# Patient Record
Sex: Female | Born: 1960 | Race: White | Hispanic: No | Smoking: Former smoker
Health system: Southern US, Community
[De-identification: ages and names within clinical notes are randomized; demographics above are authoritative.]

## PROBLEM LIST (undated history)

## (undated) DIAGNOSIS — E785 Hyperlipidemia, unspecified: Secondary | ICD-10-CM

## (undated) DIAGNOSIS — Z952 Presence of prosthetic heart valve: Secondary | ICD-10-CM

## (undated) DIAGNOSIS — I10 Essential (primary) hypertension: Secondary | ICD-10-CM

## (undated) DIAGNOSIS — Z8774 Personal history of (corrected) congenital malformations of heart and circulatory system: Secondary | ICD-10-CM

## (undated) HISTORY — DX: Essential (primary) hypertension: I10

## (undated) HISTORY — PX: TRANSTHORACIC ECHOCARDIOGRAM: SHX275

## (undated) HISTORY — DX: Hyperlipidemia, unspecified: E78.5

## (undated) HISTORY — DX: Personal history of (corrected) congenital malformations of heart and circulatory system: Z87.74

## (undated) HISTORY — DX: Presence of prosthetic heart valve: Z95.2

---

## 1998-12-24 HISTORY — PX: ABDOMINAL HYSTERECTOMY: SHX81

## 2000-04-24 ENCOUNTER — Other Ambulatory Visit: Admission: RE | Admit: 2000-04-24 | Discharge: 2000-04-24 | Payer: Self-pay | Admitting: Obstetrics and Gynecology

## 2000-12-24 HISTORY — PX: ANTERIOR CRUCIATE LIGAMENT REPAIR: SHX115

## 2002-08-06 ENCOUNTER — Inpatient Hospital Stay (HOSPITAL_COMMUNITY): Admission: AD | Admit: 2002-08-06 | Discharge: 2002-08-15 | Payer: Self-pay | Admitting: Obstetrics and Gynecology

## 2002-08-06 ENCOUNTER — Encounter (INDEPENDENT_AMBULATORY_CARE_PROVIDER_SITE_OTHER): Payer: Self-pay | Admitting: Specialist

## 2002-08-07 ENCOUNTER — Encounter: Payer: Self-pay | Admitting: Obstetrics and Gynecology

## 2002-08-13 ENCOUNTER — Encounter: Payer: Self-pay | Admitting: Obstetrics and Gynecology

## 2005-09-25 ENCOUNTER — Ambulatory Visit: Payer: Self-pay | Admitting: General Practice

## 2005-10-15 ENCOUNTER — Ambulatory Visit: Payer: Self-pay | Admitting: General Practice

## 2006-10-09 ENCOUNTER — Ambulatory Visit: Payer: Self-pay | Admitting: General Practice

## 2007-10-14 ENCOUNTER — Ambulatory Visit: Payer: Self-pay | Admitting: General Practice

## 2008-10-19 ENCOUNTER — Ambulatory Visit: Payer: Self-pay | Admitting: General Practice

## 2009-10-25 ENCOUNTER — Ambulatory Visit: Payer: Self-pay | Admitting: General Practice

## 2009-11-23 HISTORY — PX: CARDIAC CATHETERIZATION: SHX172

## 2009-12-21 ENCOUNTER — Ambulatory Visit (HOSPITAL_COMMUNITY): Admission: RE | Admit: 2009-12-21 | Discharge: 2009-12-21 | Payer: Self-pay | Admitting: Cardiology

## 2009-12-24 DIAGNOSIS — Z952 Presence of prosthetic heart valve: Secondary | ICD-10-CM | POA: Insufficient documentation

## 2009-12-24 HISTORY — DX: Presence of prosthetic heart valve: Z95.2

## 2009-12-24 HISTORY — PX: AORTIC VALVE REPLACEMENT: SHX41

## 2009-12-24 HISTORY — PX: BENTALL PROCEDURE: SHX5058

## 2009-12-27 ENCOUNTER — Ambulatory Visit: Payer: Self-pay | Admitting: Surgery

## 2009-12-28 ENCOUNTER — Encounter: Admission: RE | Admit: 2009-12-28 | Discharge: 2009-12-28 | Payer: Self-pay | Admitting: Surgery

## 2009-12-29 ENCOUNTER — Ambulatory Visit: Payer: Self-pay | Admitting: Surgery

## 2009-12-30 ENCOUNTER — Encounter: Payer: Self-pay | Admitting: Surgery

## 2010-01-02 ENCOUNTER — Ambulatory Visit: Payer: Self-pay | Admitting: Surgery

## 2010-01-02 ENCOUNTER — Encounter: Payer: Self-pay | Admitting: Surgery

## 2010-01-02 ENCOUNTER — Inpatient Hospital Stay (HOSPITAL_COMMUNITY): Admission: RE | Admit: 2010-01-02 | Discharge: 2010-01-07 | Payer: Self-pay | Admitting: Surgery

## 2010-01-03 DIAGNOSIS — Z7901 Long term (current) use of anticoagulants: Secondary | ICD-10-CM | POA: Insufficient documentation

## 2010-01-31 ENCOUNTER — Ambulatory Visit: Payer: Self-pay | Admitting: Surgery

## 2010-01-31 ENCOUNTER — Encounter: Admission: RE | Admit: 2010-01-31 | Discharge: 2010-01-31 | Payer: Self-pay | Admitting: Surgery

## 2010-09-12 IMAGING — CR DG CHEST 2V
2 series · 2 of 2 positions shown · non-contrast
Comparison: CT chest of 12/28/2009.

CLINICAL DATA: Aortic insufficiency.  Preoperative film.

CHEST - 2 VIEW

[view not recorded (1 of 2)]
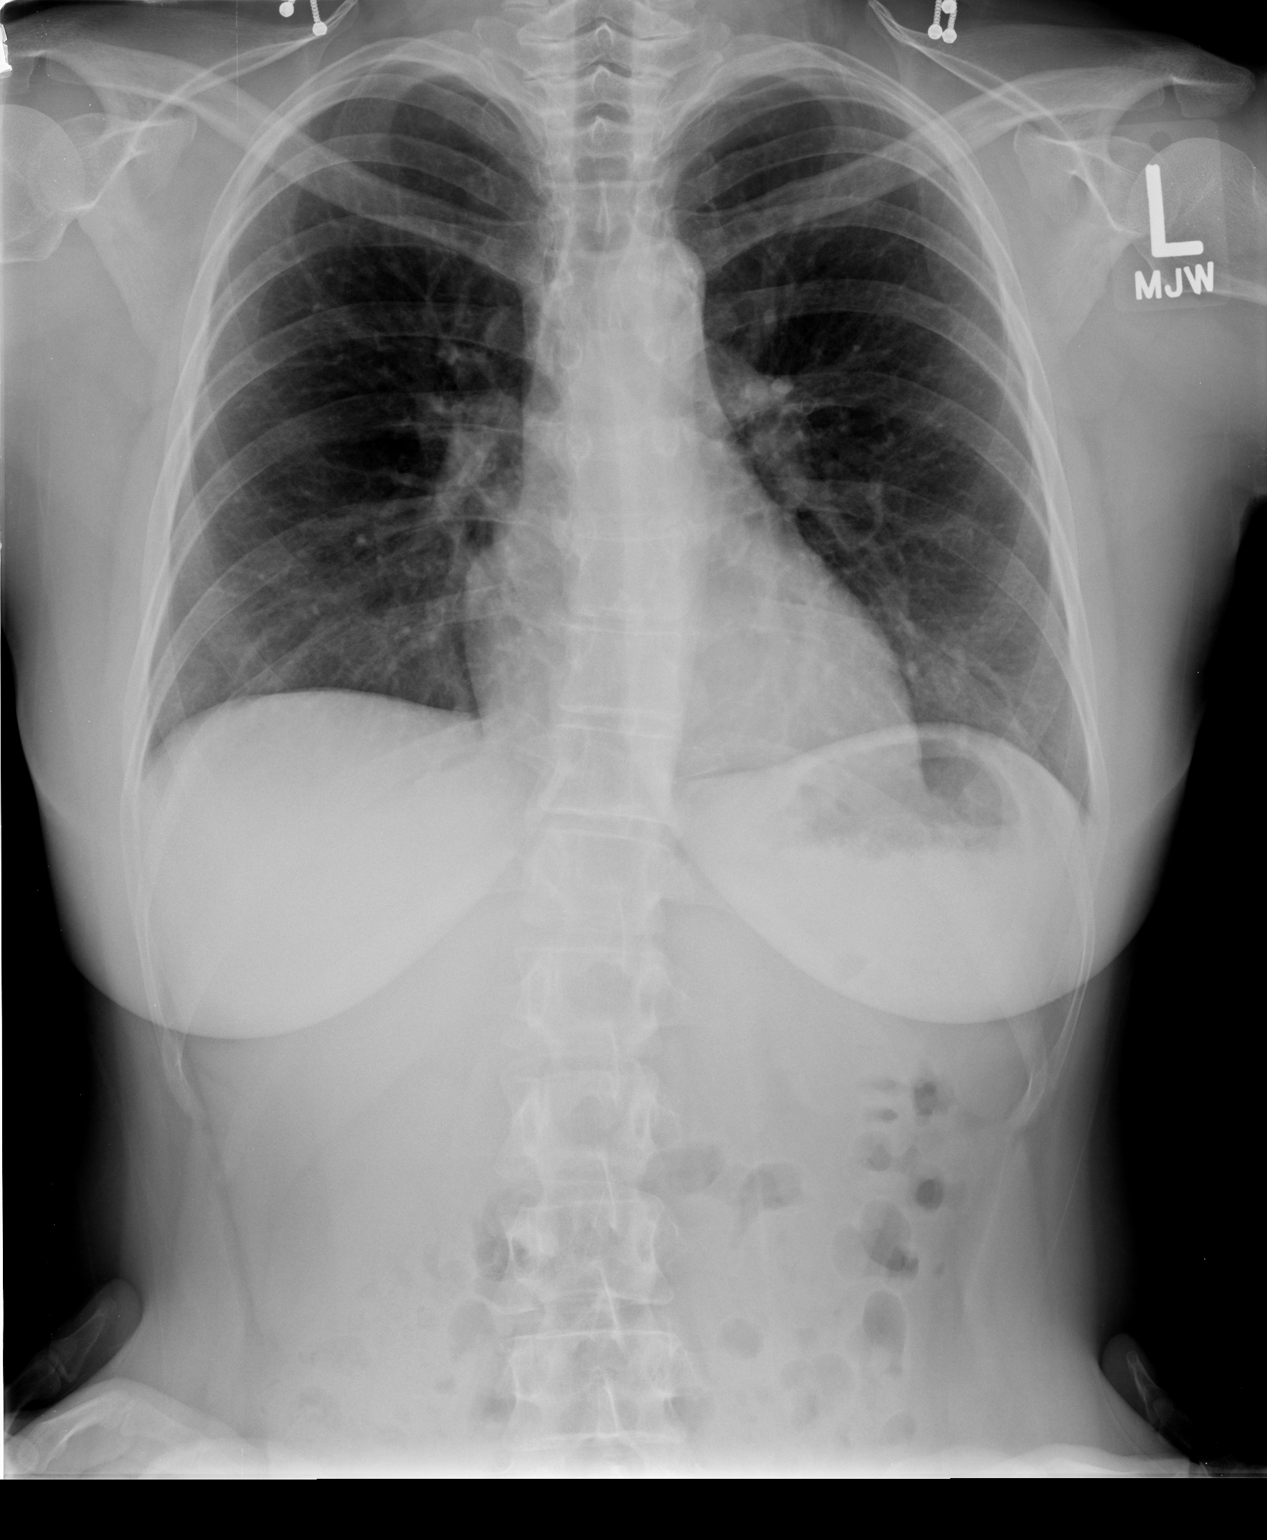

[view not recorded (2 of 2)]
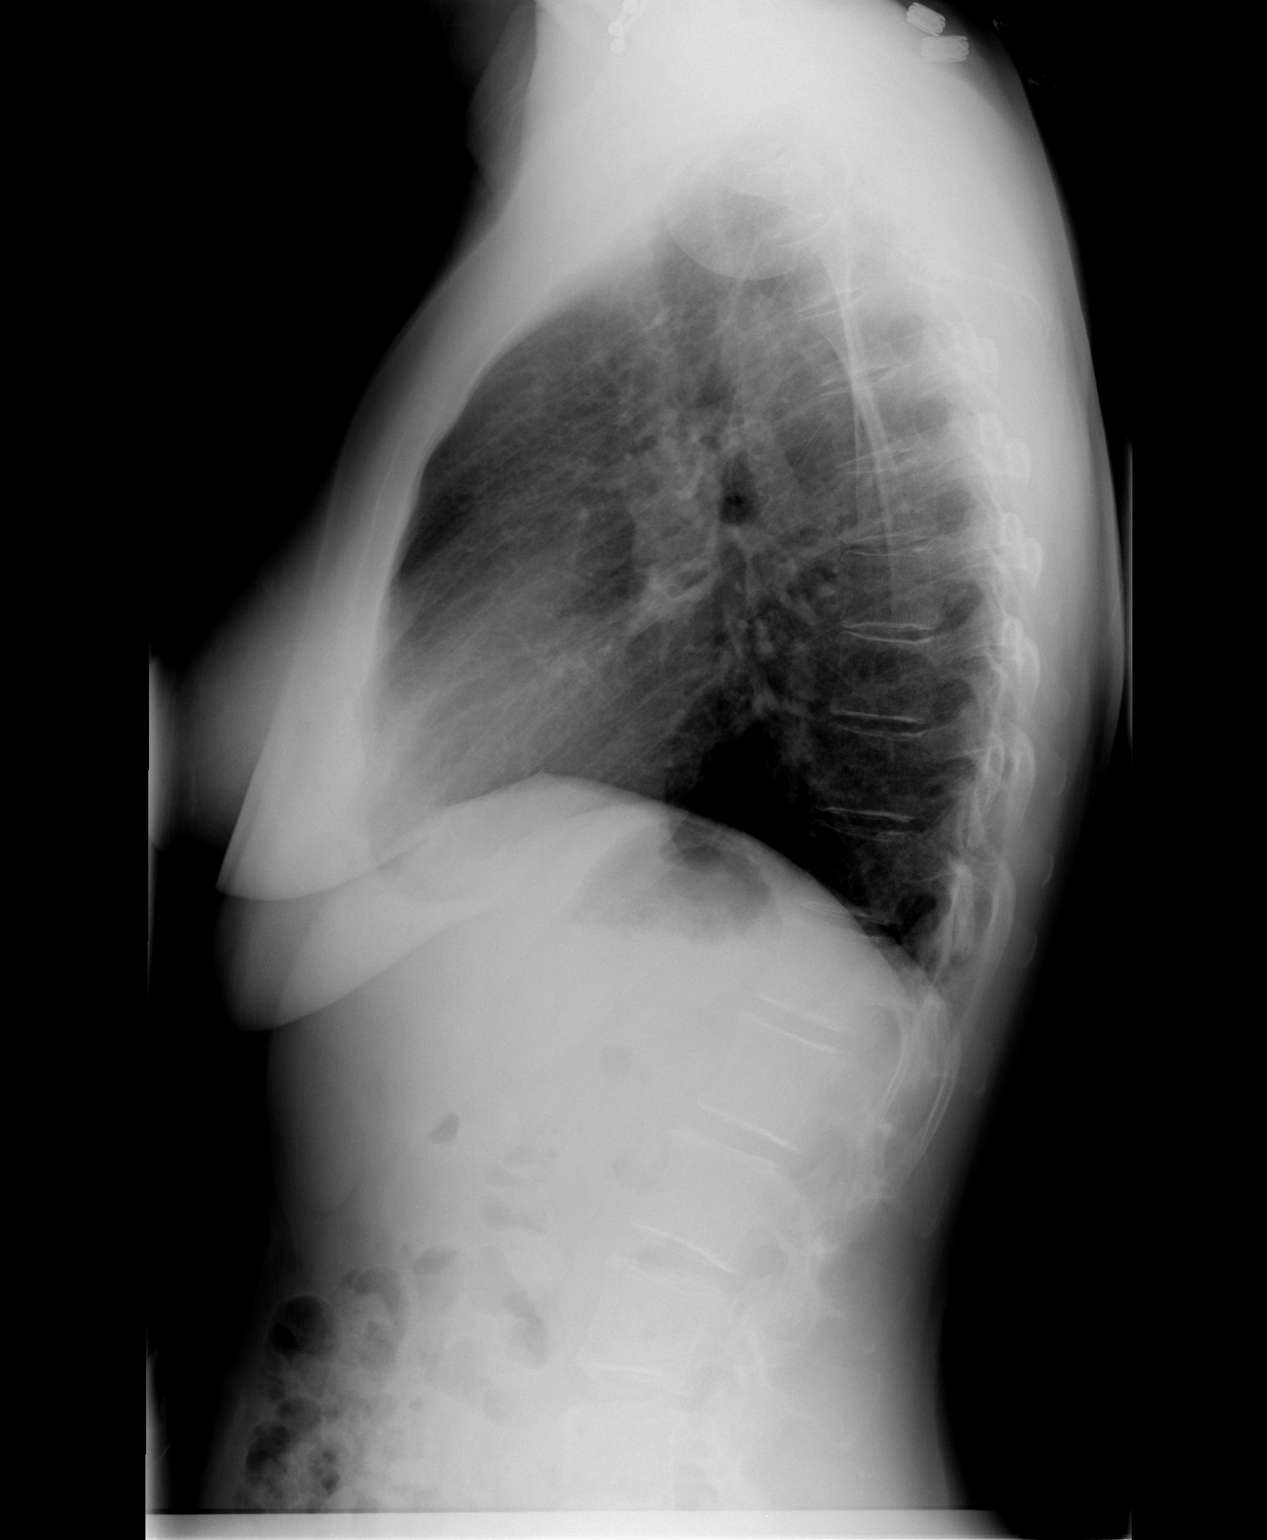

[2 of 2 positions shown; findings below may reference images not displayed]

FINDINGS: Lungs are clear.  Heart size is normal.  No pleural
effusion or focal bony abnormality.
IMPRESSION: No acute disease.

## 2010-09-15 IMAGING — CR DG CHEST 1V PORT
1 series · 1 of 1 positions shown · non-contrast
Comparison: Chest x-ray of 12/30/2009

CLINICAL DATA: Postop CABG, aortic insufficiency

PORTABLE CHEST - 1 VIEW

[view not recorded]
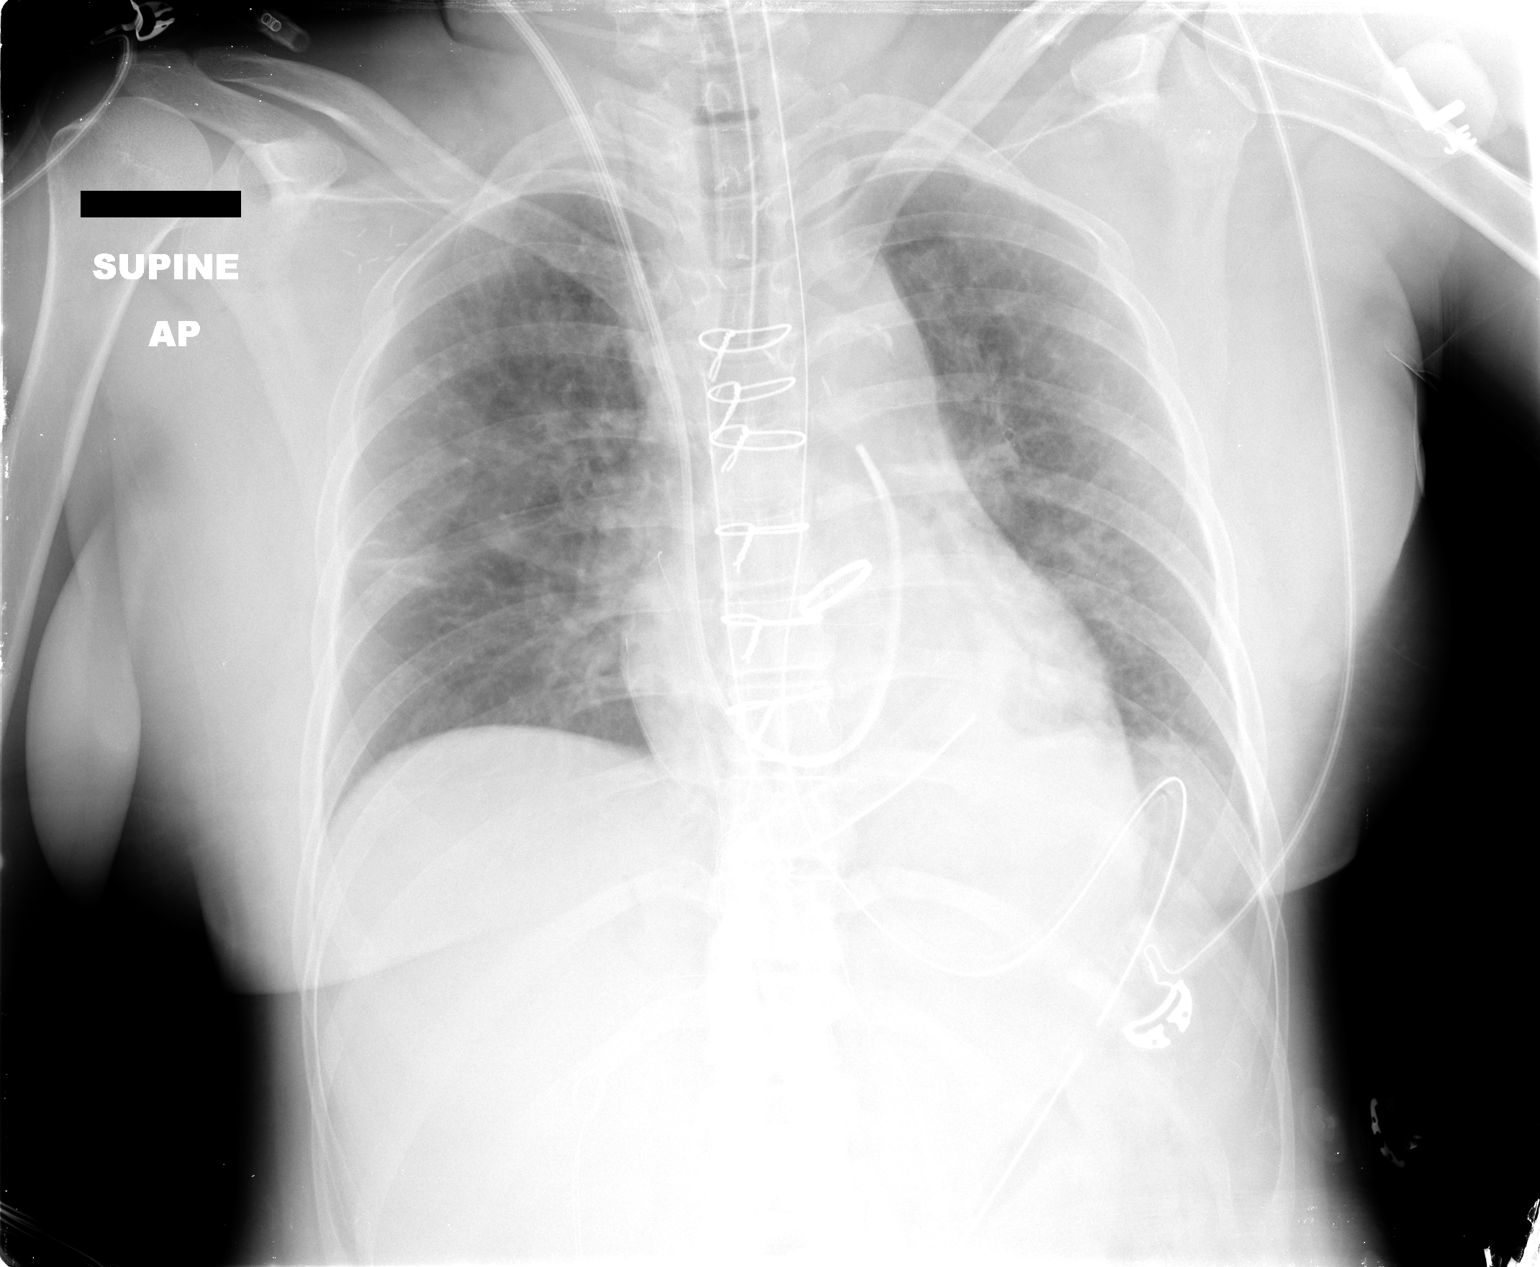

[1 of 1 positions shown; findings below may reference images not displayed]

FINDINGS: The tip of the endotracheal tube is approximate 1.5 cm
above the carina.  Swan-Ganz catheter tip is in the left main
pulmonary artery.  There is cardiomegaly present with mild
pulmonary vascular congestion noted.  No pneumothorax is seen.  NG
tube is noted within the stomach.
IMPRESSION: 1.  Post CABG, endotracheal tube tip 1.5 cm above carina.
2.  Swan-Ganz catheter in the left main pulmonary artery.
3.  Mild pulmonary vascular congestion.

## 2010-09-17 IMAGING — CR DG CHEST 2V
2 series · 2 of 2 positions shown · non-contrast
Comparison: 01/03/2010.

CLINICAL DATA: Aortic insufficiency.  Status post valve
replacement.

CHEST - 2 VIEW

[w chest pa]
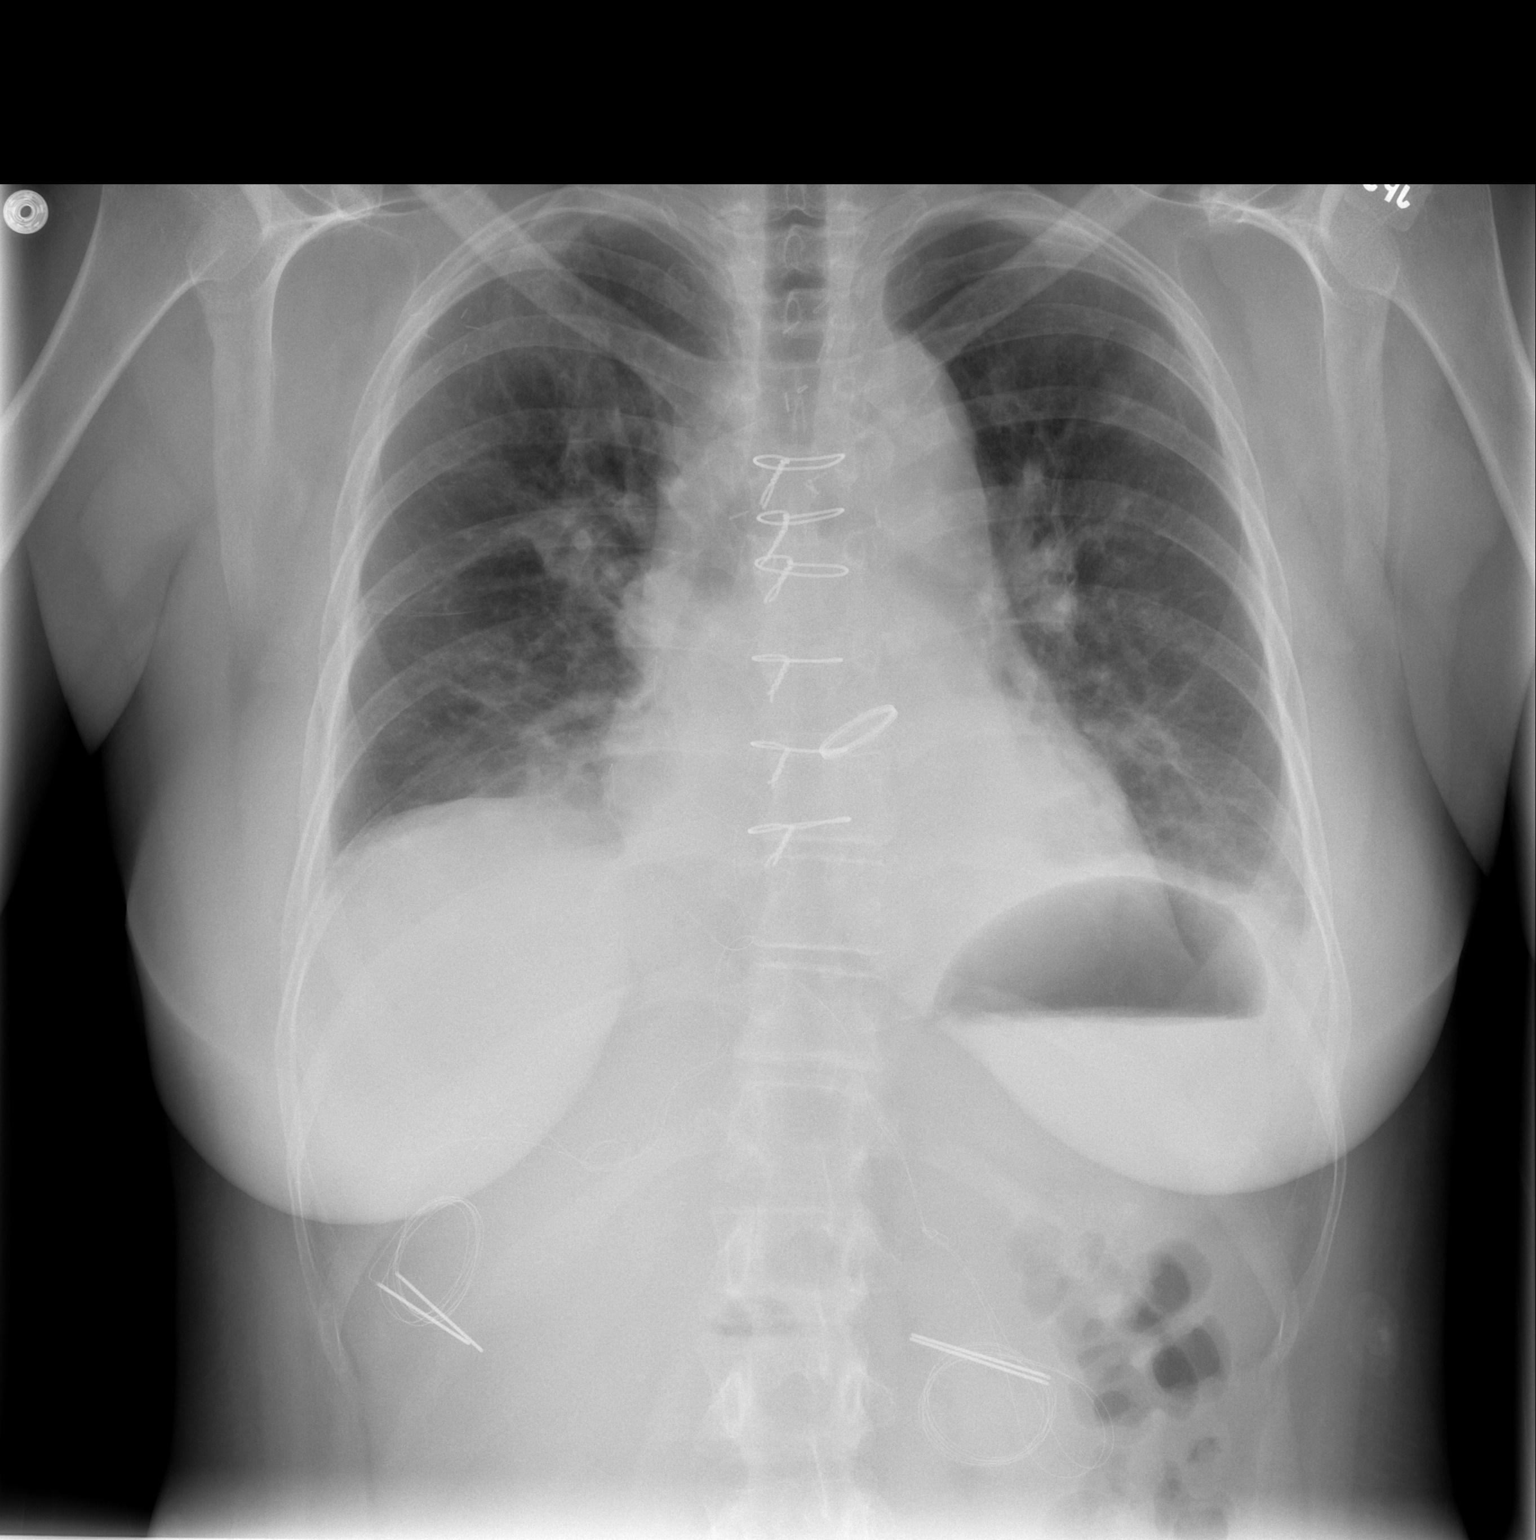

[w chest lat]
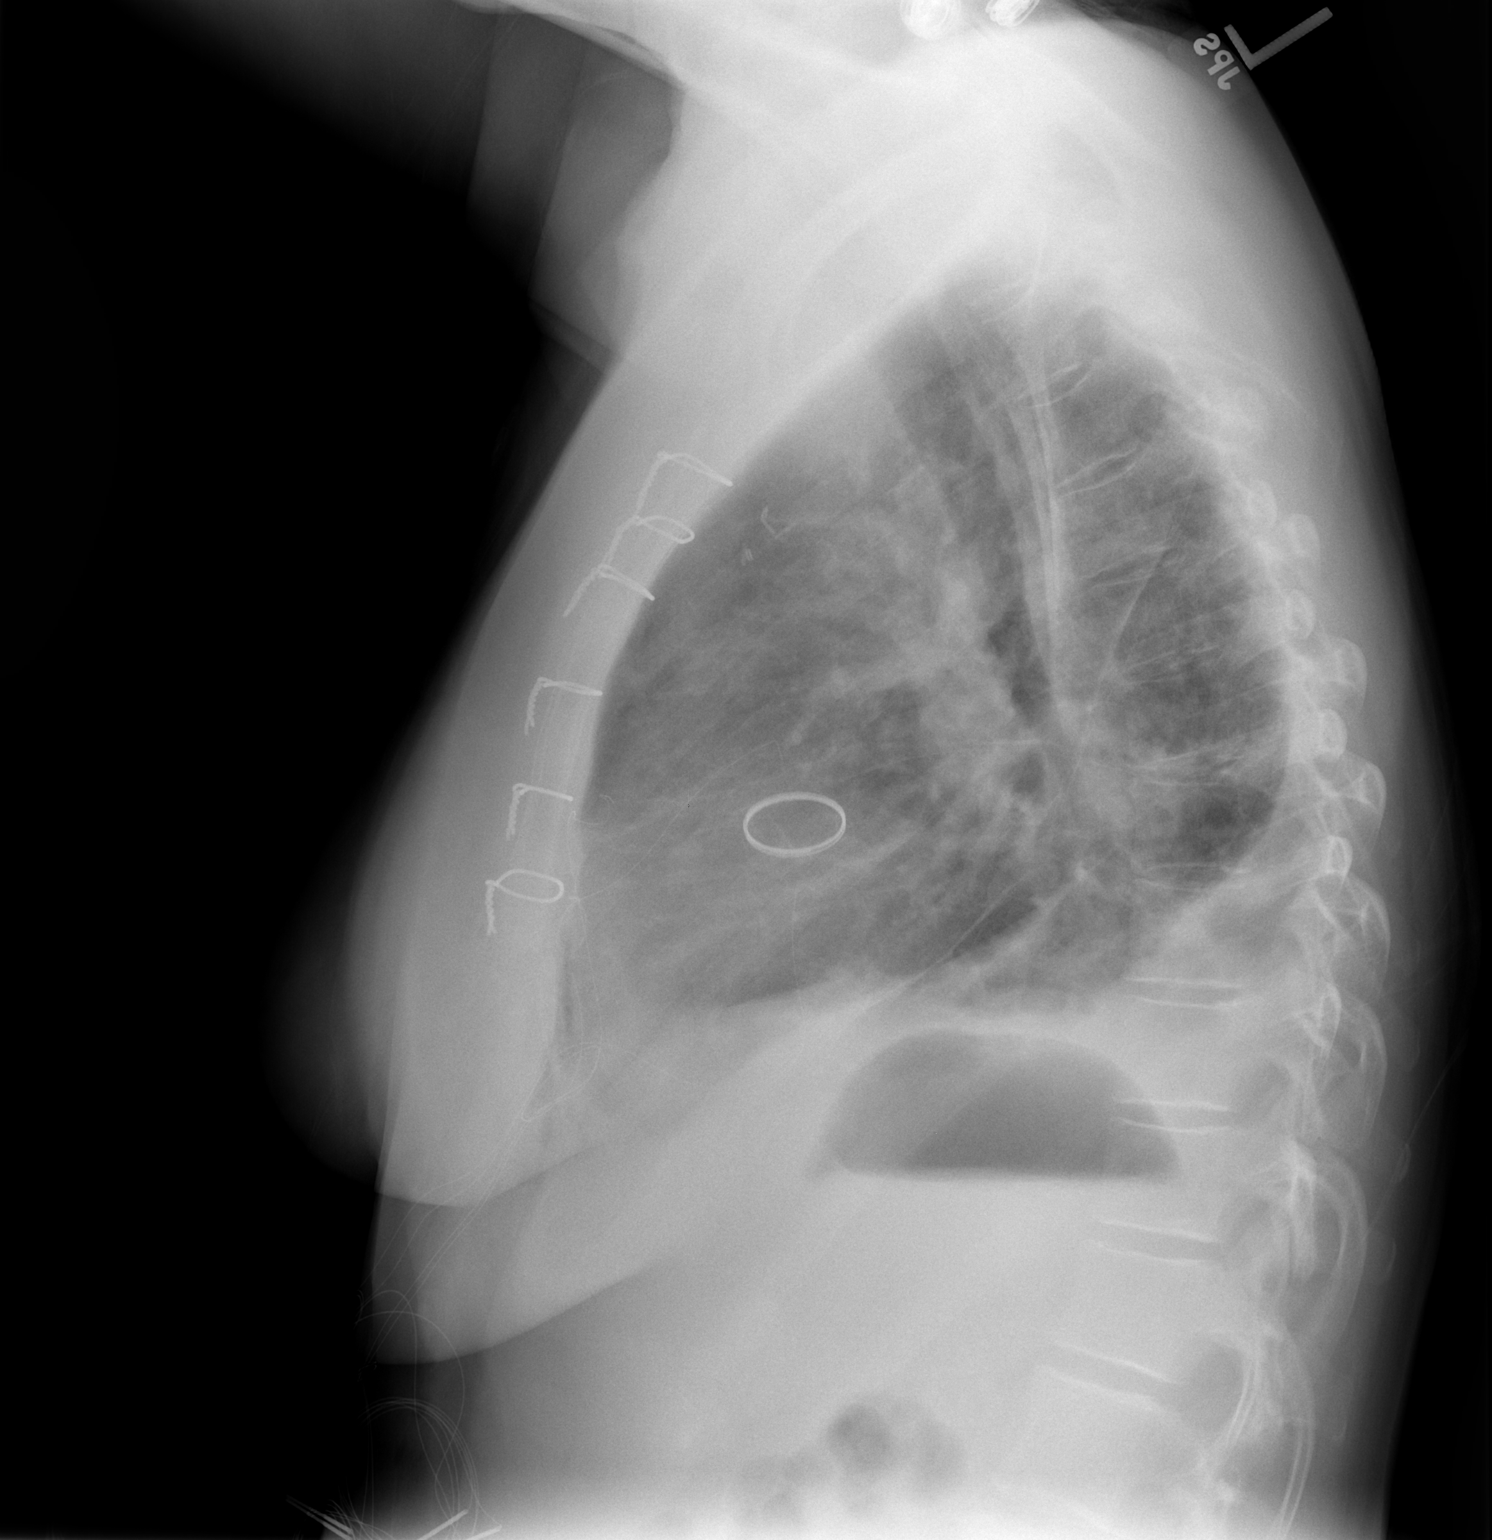

[2 of 2 positions shown; findings below may reference images not displayed]

FINDINGS: The Swan-Ganz catheter and right IJ sheath have been
removed.  The patient is status post median sternotomy for aortic
valve replacement.  Aeration continues to improve.  Small bilateral
pleural effusions and associated atelectasis persists.  The
external pacing wires are in place.
IMPRESSION: 1.  Interval removal of right IJ Swan-Ganz catheter.
2.  Status post aortic valve replacement.
3.  Improving aeration in both lungs with persistent small
bilateral pleural effusions and associated atelectasis.

## 2010-10-17 ENCOUNTER — Ambulatory Visit: Payer: Self-pay | Admitting: General Practice

## 2011-03-11 LAB — CBC
HCT: 26.4 % — ABNORMAL LOW (ref 36.0–46.0)
HCT: 26.7 % — ABNORMAL LOW (ref 36.0–46.0)
HCT: 27.5 % — ABNORMAL LOW (ref 36.0–46.0)
Hemoglobin: 14.6 g/dL (ref 12.0–15.0)
Hemoglobin: 9.2 g/dL — ABNORMAL LOW (ref 12.0–15.0)
Hemoglobin: 9.2 g/dL — ABNORMAL LOW (ref 12.0–15.0)
Hemoglobin: 9.6 g/dL — ABNORMAL LOW (ref 12.0–15.0)
MCHC: 34.6 g/dL (ref 30.0–36.0)
MCHC: 34.8 g/dL (ref 30.0–36.0)
MCHC: 35 g/dL (ref 30.0–36.0)
MCHC: 35.2 g/dL (ref 30.0–36.0)
MCV: 92.3 fL (ref 78.0–100.0)
MCV: 92.9 fL (ref 78.0–100.0)
MCV: 93.4 fL (ref 78.0–100.0)
MCV: 94.3 fL (ref 78.0–100.0)
Platelets: 120 10*3/uL — ABNORMAL LOW (ref 150–400)
RBC: 2.83 MIL/uL — ABNORMAL LOW (ref 3.87–5.11)
RBC: 2.83 MIL/uL — ABNORMAL LOW (ref 3.87–5.11)
RBC: 2.86 MIL/uL — ABNORMAL LOW (ref 3.87–5.11)
RBC: 2.95 MIL/uL — ABNORMAL LOW (ref 3.87–5.11)
RBC: 4.52 MIL/uL (ref 3.87–5.11)
RDW: 13.3 % (ref 11.5–15.5)
RDW: 13.4 % (ref 11.5–15.5)
RDW: 13.8 % (ref 11.5–15.5)
WBC: 11.2 10*3/uL — ABNORMAL HIGH (ref 4.0–10.5)
WBC: 15.4 10*3/uL — ABNORMAL HIGH (ref 4.0–10.5)
WBC: 18.6 10*3/uL — ABNORMAL HIGH (ref 4.0–10.5)
WBC: 7.3 10*3/uL (ref 4.0–10.5)

## 2011-03-11 LAB — BLOOD GAS, ARTERIAL
Bicarbonate: 25 mEq/L — ABNORMAL HIGH (ref 20.0–24.0)
TCO2: 26.2 mmol/L (ref 0–100)
pCO2 arterial: 38.7 mmHg (ref 35.0–45.0)
pH, Arterial: 7.427 — ABNORMAL HIGH (ref 7.350–7.400)

## 2011-03-11 LAB — POCT I-STAT, CHEM 8
BUN: 9 mg/dL (ref 6–23)
Calcium, Ion: 0.96 mmol/L — ABNORMAL LOW (ref 1.12–1.32)
Chloride: 105 mEq/L (ref 96–112)
Chloride: 109 mEq/L (ref 96–112)
Glucose, Bld: 97 mg/dL (ref 70–99)
HCT: 28 % — ABNORMAL LOW (ref 36.0–46.0)
Sodium: 145 mEq/L (ref 135–145)
TCO2: 30 mmol/L (ref 0–100)

## 2011-03-11 LAB — POCT I-STAT 3, ART BLOOD GAS (G3+)
Acid-Base Excess: 1 mmol/L (ref 0.0–2.0)
Bicarbonate: 24.7 mEq/L — ABNORMAL HIGH (ref 20.0–24.0)
Bicarbonate: 27.2 mEq/L — ABNORMAL HIGH (ref 20.0–24.0)
O2 Saturation: 100 %
O2 Saturation: 99 %
O2 Saturation: 99 %
TCO2: 29 mmol/L (ref 0–100)
pCO2 arterial: 40.4 mmHg (ref 35.0–45.0)
pCO2 arterial: 51.1 mmHg — ABNORMAL HIGH (ref 35.0–45.0)
pH, Arterial: 7.426 — ABNORMAL HIGH (ref 7.350–7.400)
pO2, Arterial: 105 mmHg — ABNORMAL HIGH (ref 80.0–100.0)
pO2, Arterial: 168 mmHg — ABNORMAL HIGH (ref 80.0–100.0)
pO2, Arterial: 264 mmHg — ABNORMAL HIGH (ref 80.0–100.0)

## 2011-03-11 LAB — PROTIME-INR
INR: 0.92 (ref 0.00–1.49)
INR: 1.17 (ref 0.00–1.49)
INR: 1.65 — ABNORMAL HIGH (ref 0.00–1.49)
INR: 1.65 — ABNORMAL HIGH (ref 0.00–1.49)
INR: 1.92 — ABNORMAL HIGH (ref 0.00–1.49)
Prothrombin Time: 12.3 seconds (ref 11.6–15.2)
Prothrombin Time: 19.4 seconds — ABNORMAL HIGH (ref 11.6–15.2)
Prothrombin Time: 21.8 seconds — ABNORMAL HIGH (ref 11.6–15.2)

## 2011-03-11 LAB — TYPE AND SCREEN

## 2011-03-11 LAB — PREPARE CRYOPRECIPITATE

## 2011-03-11 LAB — URINALYSIS, ROUTINE W REFLEX MICROSCOPIC
Glucose, UA: NEGATIVE mg/dL
Hgb urine dipstick: NEGATIVE
Ketones, ur: NEGATIVE mg/dL
Specific Gravity, Urine: 1.016 (ref 1.005–1.030)

## 2011-03-11 LAB — ABO/RH: ABO/RH(D): O NEG

## 2011-03-11 LAB — POCT I-STAT 4, (NA,K, GLUC, HGB,HCT)
Glucose, Bld: 108 mg/dL — ABNORMAL HIGH (ref 70–99)
Glucose, Bld: 73 mg/dL (ref 70–99)
Glucose, Bld: 88 mg/dL (ref 70–99)
HCT: 24 % — ABNORMAL LOW (ref 36.0–46.0)
HCT: 25 % — ABNORMAL LOW (ref 36.0–46.0)
Hemoglobin: 12.6 g/dL (ref 12.0–15.0)
Hemoglobin: 7.8 g/dL — ABNORMAL LOW (ref 12.0–15.0)
Hemoglobin: 8.5 g/dL — ABNORMAL LOW (ref 12.0–15.0)
Potassium: 3.9 mEq/L (ref 3.5–5.1)
Potassium: 4.5 mEq/L (ref 3.5–5.1)
Potassium: 5.7 mEq/L — ABNORMAL HIGH (ref 3.5–5.1)
Sodium: 125 mEq/L — ABNORMAL LOW (ref 135–145)
Sodium: 139 mEq/L (ref 135–145)
Sodium: 140 mEq/L (ref 135–145)

## 2011-03-11 LAB — COMPREHENSIVE METABOLIC PANEL
Alkaline Phosphatase: 80 U/L (ref 39–117)
CO2: 22 mEq/L (ref 19–32)
Calcium: 9.5 mg/dL (ref 8.4–10.5)
Glucose, Bld: 104 mg/dL — ABNORMAL HIGH (ref 70–99)
Potassium: 4.7 mEq/L (ref 3.5–5.1)
Total Bilirubin: 1.3 mg/dL — ABNORMAL HIGH (ref 0.3–1.2)

## 2011-03-11 LAB — MAGNESIUM
Magnesium: 2.8 mg/dL — ABNORMAL HIGH (ref 1.5–2.5)
Magnesium: 3.3 mg/dL — ABNORMAL HIGH (ref 1.5–2.5)

## 2011-03-11 LAB — BASIC METABOLIC PANEL
CO2: 26 mEq/L (ref 19–32)
CO2: 28 mEq/L (ref 19–32)
Calcium: 7.1 mg/dL — ABNORMAL LOW (ref 8.4–10.5)
Chloride: 106 mEq/L (ref 96–112)
Chloride: 114 mEq/L — ABNORMAL HIGH (ref 96–112)
Creatinine, Ser: 0.66 mg/dL (ref 0.4–1.2)
GFR calc Af Amer: >60 mL/min (ref >60)
Glucose, Bld: 122 mg/dL — ABNORMAL HIGH (ref 70–99)
Potassium: 4 mEq/L (ref 3.5–5.1)
Potassium: 4.7 mEq/L (ref 3.5–5.1)
Sodium: 145 mEq/L (ref 135–145)

## 2011-03-11 LAB — POCT I-STAT GLUCOSE
Glucose, Bld: 115 mg/dL — ABNORMAL HIGH (ref 70–99)
Glucose, Bld: 73 mg/dL (ref 70–99)
Operator id: 3342

## 2011-03-11 LAB — PREPARE PLATELETS

## 2011-03-11 LAB — GLUCOSE, CAPILLARY
Glucose-Capillary: 122 mg/dL — ABNORMAL HIGH (ref 70–99)
Glucose-Capillary: 143 mg/dL — ABNORMAL HIGH (ref 70–99)

## 2011-03-11 LAB — HEMOGLOBIN A1C: Mean Plasma Glucose: 126 mg/dL

## 2011-03-11 LAB — APTT
aPTT: 31 seconds (ref 24–37)
aPTT: 45 seconds — ABNORMAL HIGH (ref 24–37)

## 2011-03-11 LAB — POCT I-STAT 3, VENOUS BLOOD GAS (G3P V)
Acid-base deficit: 1 mmol/L (ref 0.0–2.0)
O2 Saturation: 83 %
pCO2, Ven: 45.2 mmHg (ref 45.0–50.0)
pH, Ven: 7.348 — ABNORMAL HIGH (ref 7.250–7.300)

## 2011-03-11 LAB — FIBRINOGEN: Fibrinogen: 144 mg/dL — ABNORMAL LOW (ref 204–475)

## 2011-03-11 LAB — HEMOGLOBIN AND HEMATOCRIT, BLOOD: Hemoglobin: 9.1 g/dL — ABNORMAL LOW (ref 12.0–15.0)

## 2011-03-26 LAB — POCT I-STAT 3, VENOUS BLOOD GAS (G3P V)
Acid-base deficit: 1 mmol/L (ref 0.0–2.0)
pCO2, Ven: 40.2 mmHg — ABNORMAL LOW (ref 45.0–50.0)
pH, Ven: 7.386 — ABNORMAL HIGH (ref 7.250–7.300)
pO2, Ven: 35 mmHg (ref 30.0–45.0)

## 2011-03-26 LAB — POCT I-STAT 3, ART BLOOD GAS (G3+)
Bicarbonate: 24.7 mEq/L — ABNORMAL HIGH (ref 20.0–24.0)
O2 Saturation: 95 %
pCO2 arterial: 38.6 mmHg (ref 35.0–45.0)
pO2, Arterial: 77 mmHg — ABNORMAL LOW (ref 80.0–100.0)

## 2011-05-08 NOTE — Assessment & Plan Note (Signed)
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 mechanical valve conduit with  reimplantation of the coronary arteries on January 02, 2010.  She had an  uncomplicated postoperative course.  Since discharge, she said that she  has been feeling well overall.  She has lost about 10 pounds which she  attributes to watching her diet.  She said she has been eating fairly  well.  She has been walking without chest pain or shortness of breath.   PHYSICAL EXAMINATION:  Blood pressure is 125/73, pulse is 51 and  regular, respiratory rate is 18 and unlabored.  Oxygen saturation on  room air is 98%.  She looks well.  Cardiac exam shows regular rate and  rhythm with crisp mechanical valve click.  There is a systolic flow  murmur over her aortic graft.  There is no diastolic murmur.  Her lungs  are clear.  The chest incision is healing well.  The sternum is stable.  There is no peripheral edema.   DIAGNOSTIC TESTS:  Followup chest x-ray shows clear lung fields and no  pleural effusions.   MEDICATIONS:  Coumadin 7.5 mg daily, Lopressor 50 mg b.i.d., hydrocodone  p.r.n. for pain, lorazepam 0.5 mg p.r.n., Tylenol p.r.n., a stool  softener p.r.n.   IMPRESSION:  Overall, the patient is recovering very well for her  surgery.  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  She has a physically strenuous job and  will not be able to return to work for 3 months postoperatively.  Her  INR is being followed in Dr. Gaspar Garbe Little's office.  She said her last  INR was around 2.9 but it had been relatively low prior to  that.  She  will  continue to follow up with Dr. Clarene Duke and will return to see me if she  develops any problems with her incisions.   Victoria [name], M.D.  Electronically Signed   BB/MEDQ  D:  01/31/2010  T:  02/01/2010  Job:  [id number]   cc:   Thereasa Solo. Little, M.D.

## 2011-05-08 NOTE — Assessment & Plan Note (Signed)
OFFICE VISIT   Victoria [name], Victoria [name]  DOB:  12/12/1961                                        December 29, 2009  CHART #:  [id number]   HISTORY:  The patient returned today for followup after a CT angiogram  of the chest to evaluate her thoracic aorta.  This showed that the  maximum diameter of her ascending aorta is 3.6 cm just proximal to the  innominate artery.  The aorta remains enlarged in the proximal arch and  decreases abruptly to 22 mm just beyond the left common carotid artery.  The remainder of her descending aorta is the same size of 22 mm.  The  proximal innominate artery is 17 mm in diameter.  Although, the diameter  of her ascending aorta is only 36 mm, it is significantly larger than  the size of the remainder of the normal aorta 22 mm.  Given her age, I  think it will be best to replace this abnormally enlarged aorta to  decrease her risk of further enlargement or aortic dissection.  I do not  know whether she has a bicuspid aortic valve, although her 2-D  echocardiogram done at [facility] did show a functionally  bicuspid aortic valve.  It may be that she has a true bicuspid valve  with enlargement of her ascending aorta in which case this certainly  should be replaced.  I discussed all this with her and her friend.  She  would like to proceed with replacement of her valve and enlarged aorta.  I discussed the operative procedure in detail including use of  circulatory arrest.  I discussed alternatives, benefits, and risks  including but not limited to bleeding, blood transfusion, infection,  stroke, myocardial infarction, anastomotic coronary artery stenosis,  heart block requiring permanent pacemaker, organ dysfunction, and death.  She understands all of these and agrees to proceed.  We will plan to do  this on Monday, January 02, 2010.   Evelene Croon, M.D.  Electronically Signed   BB/MEDQ  D:  12/29/2009  T:  12/30/2009  Job:   [id number]   cc:   Thereasa Solo. Little, M.D.

## 2011-05-08 NOTE — Consult Note (Signed)
NEW PATIENT CONSULTATION   Victoria [name], Victoria [name]  DOB:  Jul 01, 1961                                        December 27, 2009  CHART #:  [id number]   REASON FOR CONSULTATION:  Critical aortic stenosis and ascending aortic  aneurysm.   CLINICAL HISTORY:  I was asked by Dr. Clarene [name] to evaluate the patient for  consideration of aortic valve replacement and replacement of her  ascending aortic aneurysm.  She is [name] 50 year old woman with [name] known  history of heart murmur, who said she was seen initially by Dr. Clarene [name]  about 10 years ago.  She said she had an echocardiogram at that time and  had some aortic valve disease, does not remember the details.  She said  she did not follow up for many years and recently underwent further  evaluation.  An echocardiogram on November 21, 2009 showed moderate  calcification of the aortic valve leaflets with [name] functionally bicuspid  aortic valve and severe aortic stenosis with mild aortic insufficiency.  There is mild mitral regurgitation.  The mean aortic valve gradient was  54 and the peak gradient was 92.  Left ventricular size was normal with  mild concentric left ventricular hypertrophy.  Ejection fraction was  greater than 55%.  She subsequently underwent cardiac catheterization on  December 21, 2009 which showed normal coronary arteries.  There is  calcification of the aortic valve leaflets with severe aortic stenosis  and 2-3+ aortic insufficiency.  There was marked dilatation of the mid  to distal ascending aorta and proximal aortic arch.   REVIEW OF SYSTEMS:  As follows:  GENERAL:  She denies any fever or chills.  She has not had no recent  weight changes.  She denies fatigue.  EYES:  Negative.  ENT:  Negative.  She does see her dentist regularly.  ENDOCRINE:  She denies diabetes and hypothyroidism.  CARDIOVASCULAR:  She denies any chest pain or pressure.  She denies PND  and orthopnea.  She has had no exertional dyspnea.  She  denies  peripheral edema and palpitations.  RESPIRATORY:  She denies cough and sputum production.  GI:  She has had no nausea and vomiting.  She denies melena and bright  red blood per rectum.  GU:  She denies dysuria and hematuria.  MUSCULOSKELETAL:  She denies arthralgias and myalgias.  NEUROLOGICAL:  She denies any focal weakness or numbness.  She denies  dizziness and syncope.  She has never had [name] TIA or stroke.  PSYCHIATRIC:  Negative.  HEMATOLOGICAL:  She denies any history of bleeding disorders or easy  bleeding.   ALLERGIES:  None.   MEDICATIONS:  None.   PAST MEDICAL HISTORY:  Significant for C-section in the past and  subsequent hysterectomy.  She is status post left knee ACL repair.   SOCIAL HISTORY:  She is single and has [name] significant other.  She has 1  child.  She works for [name].  She smokes about 1/2 to 1  pack of cigarettes per day and drinks about 2 beers per day.  She denies  any drug use.   FAMILY HISTORY:  Negative for cardiac disease.   PHYSICAL EXAMINATION:  Vital Signs:  Blood pressure is 131/87, pulse is  65 and regular, and respiratory rate is 18 unlabored.  Oxygen saturation  on room air  is 97%.  General:  She is [name] well-developed white female, in  no distress.  HEENT:  Normocephalic and atraumatic.  Pupils are equal  and reactive to light and accommodation.  Extraocular muscles are  intact.  Her throat is clear.  Neck:  Normal carotid pulses bilaterally.  There is [name] transmitted murmur to both sides of the neck.  There is no  JVD.  Cardiac:  Regular rate and rhythm with [name] harsh 3/6 systolic murmur  heard throughout the precordium.  I do not hear [name] diastolic murmur.  Lungs:  Clear.  Abdomen:  Active bowel sounds.  Abdomen is soft and  nontender.  There are no palpable masses or organomegaly.  There is [name]  lower midline laparotomy incision.  Extremities:  No peripheral edema.  Pedal pulses are palpable bilaterally.  Skin:  Warm and dry.   Neurologic:  Alert and oriented x3.  Motor and sensory exams grossly  normal.   IMPRESSION:  The patient has severe aortic stenosis with [name] functionally  bicuspid valve by echocardiogram and an ascending aortic aneurysm that  appears to enlarge at about the takeoff of the right coronary artery and  extend up into the proximal arch.  The aortic root injection shows that  it extends up into the proximal aortic arch, probably just beyond the  left carotid artery.  I cannot get an exact diameter by cardiac  catheterization, but I suspect that it is somewhere in the range of 5-6  cm in the distal ascending aorta.  We will obtain [name] CT angiogram of  chest to evaluate the size of her aorta and the extent of the aneurysm.  I think, she will require replacement of her aortic valve and ascending  aorta using [name] mechanical valve conduit with reimplantation of her  coronary arteries.  This may require replacement of her proximal aortic  arch as well.  I discussed all this with the patient and her boyfriend.  We will obtain the CT angiogram tomorrow and I plan to see her back on  Thursday of this week to discuss results with her and plan surgery next  week, probably on Monday.   Victoria [name], M.D.  Electronically Signed   BB/MEDQ  D:  12/27/2009  T:  12/28/2009  Job:  [id number]   cc:   Victoria [name], M.D.

## 2011-05-11 NOTE — Op Note (Signed)
NAME:  Victoria [name], Victoria [name]                         ACCOUNT NO.:  [id number]   MEDICAL RECORD NO.:  [id number]                   PATIENT TYPE:  INP   LOCATION:  9322                                 FACILITY:  [facility]   PHYSICIAN:  Miguel Aschoff, M.D.                    DATE OF BIRTH:  08-May-1961   DATE OF PROCEDURE:  08/11/2002  DATE OF DISCHARGE:                                 OPERATIVE REPORT   PREOPERATIVE DIAGNOSES:  1. Tuboovarian abscess.  2. Abdominal pain.   POSTOPERATIVE DIAGNOSES:  1. Tuboovarian abscess.  2. Cul-de-sac abscess.  3. Multiple intrabowel adhesions.   PROCEDURES:  1. Exploratory laparotomy.  2. Lysis of interloop bowel adhesions.  3. Drainage of pelvic abscess.  4. Total abdominal hysterectomy and bilateral salpingo-oophorectomy.   SURGEON:  Miguel Aschoff, M.D.   ANESTHESIA:  General.   ASSISTANT:  Randye Lobo, M.D.   COMPLICATIONS:  None.   JUSTIFICATION:  The patient is a 50 year old white female who was admitted  on August 14 because of low abdominal pain and fever.  Ultrasound  examination was carried out and it revealed a presence of large mass in the  cul-de-sac as well as findings on the ultrasound consistent with bilateral  tuboovarian abscesses.  The patient had been started on antibiotic therapy  and although there seemed to be a diminution of her fever, there was no  diminution in her pain and in view of the findings of tuboovarian abscesses,  she is being taken now to the operating table to undergo exploratory  laparotomy and probable total abdominal hysterectomy and bilateral salpingo-  oophorectomy.  The risks and benefits of the procedure were discussed with  the patient.   PROCEDURE:  The patient was taken to the operating room, placed in a supine  position, and general anesthesia was administered without difficulty.  She  was then prepped and draped in the usual sterile fashion.  A Foley catheter  was inserted.  Once this was done, a  midline incision was made from the  symphysis pubis to the umbilicus which was extended down through the  subcutaneous tissue.  Thus, bleeding points were coagulated as they were  encountered.  The fascia was then identified and incised vertically.  The  peritoneum was then revealed and then entered, careful to avoid internal  structures.  The peritoneal incision was then extended under direct  visualization.  It was decided at this point, that there was a large  inflammatory mass in the lower abdomen.  This consisted of the omentum which  was indurated, welling  off an abscess involving the pelvis and cul-de-sac.  It was possible to free the omentum from the pelvic structures.  The anatomy  was markedly distorted because of the inflammation.  At this point, in  effort to restore the anatomy, the multiple adhesions holding the loops of  bowel together were taken  down sharply.  These were mostly inflammatory and  filmy in nature and then the bowel was tacked out of the pelvis.  Inspection  of the cul-de-sac revealed the presence of a large abscess.  This abscess  was opened and drained and then efforts were made to restore the anatomy to  more normal configuration to proceed with the surgery.  The ovaries and  tubes were densely adherent to the inflammatory mass and lateral pelvic side  wall.  These adhesions were taken down with both sharp and blunt dissection  and it was possible to elevate the tubes and ovaries.  The round ligaments  were then identified.  These were suture ligated and then cut and then it  was possible to find an avascular region below the utero-ovarian ligament.  The ureter was palpated and the infundibulopelvic ligament was clamped, cut,  and doubly ligated using suture ligatures of 0 Vicryl.  Dissection was then  carried medially towards the uterus.  The broad ligament and structures were  all markedly edematous and friable due to the inflammation.  It was  possible,  however, to identify the uterine vessels, which were then clamped,  cut, and suture ligated using suture ligatures of 0 Vicryl.  There was  marked induration on the anterior uterus and bladder again plus an  inflammatory mass and it was possible to dissect the bladder off the lower  uterine segment with care to avoid any injury to the structure.  Then  multiple bites were taken of the parametrial tissue.  Those were cut and  suture ligated using suture ligatures of 0 Vicryl.  This dissection  continued until it was possible to identify the reflection of the vagina on  the cervix.  The vaginal vault was then entered and then the cervix was cut  free from the vaginal fornices.  The specimen was then excised consisting of  the cervix, uterus, tubes, and ovaries, and inflammatory masses.  The  __________ of the vaginal cuff were then ligated using figure-of-eight  sutures of 0 Vicryl.  The vaginal cuff was then run using running  interlocking 0 Vicryl.  The inflammatory exudate was then removed from the  cul-de-sac and then the cul-de-sac was copiously irrigated with saline.  It  was then possible to placed a mushroom drain in the cul-de-sac exiting into  the vagina.  The sigmoid colon was then placed in the cul-de-sac.  Once this  was done, attention was then directed to multiple additional bowel  adhesions.  These adhesions were all taken down restoring the bowel to a  normal configuration.  The cecum was identified.  It appeared that the  appendix was retrocecal and it did not appear at this point that this  represented an inflammatory issue added to the appendix.  Once all the  adhesions were taken down and hemostasis was satisfactory and after copious  irrigation of the pelvis, lap counts were taken and found to be correct and  then the abdomen was closed.  The parietal peritoneum was closed using  running continuous 0 Vicryl suture.  The fascia was closed using 0 PDS in a running fashion.   Two sutures were used each starting at the angles of the  fascial incision and meeting in the midline and then interrupted 0 Vicryl  sutures were used to reinforce the PDS.  The skin was then irrigated with  saline and then closed using staples.  The estimated blood loss was  approximately 700 cc.  Prior  to closing the abdomen, an NG tube was placed  because of the amount of manipulation done to the bowel.  The patient  tolerated the procedure well and was taken to the recovery room in  satisfactory condition.                                               Miguel Aschoff, M.D.    AR/MEDQ  D:  08/11/2002  T:  08/12/2002  Job:  [id number]

## 2011-05-11 NOTE — Discharge Summary (Signed)
NAMEMarland Kitchen  LONIA, ROANE NO.:  [id number]   MEDICAL RECORD NO.:  [id number]                   PATIENT TYPE:   LOCATION:                                       FACILITY:   PHYSICIAN:  Miguel Aschoff, M.D.                    DATE OF BIRTH:   DATE OF ADMISSION:  08/06/2002  DATE OF DISCHARGE:  08/15/2002                                 DISCHARGE SUMMARY   ADMISSION DIAGNOSES:  Pelvic abscess.   FINAL DIAGNOSES:  1. Pelvic abscess with acute suppurative cervicitis.  2. Multiple intra-abdominal bowel loop adhesions.  3. Acute suppurative cervicitis involving ovaries, tubes, uterus, intestinal     surfaces.  4. Adenomyosis.  5. Leiomyomata.   OPERATION AND PROCEDURES:  1. Exploratory laparotomy.  2. Total abdominal hysterectomy.  3. Bilateral salpingo-oophorectomy.  4. Drainage of pelvic abscess.  5. Lysis of interloop bowel adhesions.   BRIEF HISTORY:  The patient is a 50 year old white female admitted to the  hospital on August 14 with lower abdominal pain and fever.  She was noted on  ultrasound to have a large complex adnexal mass filling the cul-de-sac,  extending into the left adnexa.  The mass measuring 7 cm x 4 cm x 2 cm and  another mass measuring 6.5 x 2.6 x 3 cm.  The findings on ultrasound  appeared to be consistent with a tubo-ovarian abscess.  The patient was  started on triple antibiotic therapy in an effort to see if the abscess  would respond to medical therapy.  The patient had a history of a prior IUD.  She was started on ampicillin 2 g, clindamycin, and gentamicin.  The patient  did have a clinical response in her fever; however, there was absolutely no  clinical response in her abdominal pain.  She was reassessed on August 10, 2002.  At this point she had a maximum temperature of 100.9.  Still had  bilateral lower abdominal tenderness with voluntary guarding.  No rebound  was noted.  In view of the fact that the patient had no  improvement of her  symptoms, it was felt that laparotomy was indicated at this time.  On August 11, 2002 she was taken to the operating room where under general anesthesia  an exploratory laparotomy was carried out.  The patient was noted to have a  large pelvic abscess with marked inflammation and adhesions involving the  uterus, tubes, and ovaries and multiple loops of bowel and omentum.  Total  abdominal hysterectomy and bilateral salpingo-oophorectomy were carried out.  Interloop bowel adhesions were lysed.  The abdomen and pelvis were irrigated  with large volumes of saline.  A large mushroom drain was placed in the cul-  de-sac exiting out the vagina.  The patient was continued on her  antibiotics.  She could have an NG tube placed because of the  multiple bowel  loop adhesions and she was started on Lovenox prophylaxis to prevent blood  clot formation.  The patient's white count on August 8 was 23,000.  Hemoglobin was 9.4.  She had some clinical improvement and by the third  postoperative day was noted to have bowel sounds and did start passing gas.  The intravenous antibiotics were stopped on August 14, 2002 and she was  started on hydrocodone antibiotic for the completion of her therapy.  By  August 23 she was feeling better, was passing gas.  She was tolerating a  regular diet at this point and ambulating well.  By the 23rd the drainage  from the vagina was minimal.  The mushroom drain was removed on the 22nd.  The pathology report revealed the presence of acute suppurative cervicitis  which appeared to originate from structures outside the uterus and ovaries  and moved to the structures.  There was no endometritis noted and even  though the patient had history of an IUD, it appeared that this was not the  source of her infection.  She was felt to be stable enough on the 23rd of  August to be sent home.  She was sent home on Cipro to be taken b.i.d.,  Tylox one q.3h. as needed  for pain, Colace, Premarin 0.625 mg, Phenergan 25  mg.  She was to return to the office the following Tuesday to have her  staples removed.  The patient was instructed to do no heavy lifting, place  nothing in the vagina, to call if there are any problems such as fever,  pain, or heavy bleeding.  She was sent home in satisfactory condition.                                               Miguel Aschoff, M.D.    AR/MEDQ  D:  10/12/2002  T:  10/12/2002  Job:  [id number]

## 2011-06-25 ENCOUNTER — Other Ambulatory Visit: Payer: Self-pay | Admitting: Thoracic Surgery

## 2011-06-25 DIAGNOSIS — I359 Nonrheumatic aortic valve disorder, unspecified: Secondary | ICD-10-CM

## 2011-06-26 ENCOUNTER — Ambulatory Visit (INDEPENDENT_AMBULATORY_CARE_PROVIDER_SITE_OTHER): Payer: BC Managed Care – PPO | Admitting: Surgery

## 2011-06-26 ENCOUNTER — Ambulatory Visit
Admission: RE | Admit: 2011-06-26 | Discharge: 2011-06-26 | Disposition: A | Discharge status: Home / Self Care | Payer: BC Managed Care – PPO | Source: Ambulatory Visit | Attending: Thoracic Surgery | Admitting: Thoracic Surgery

## 2011-06-26 DIAGNOSIS — I359 Nonrheumatic aortic valve disorder, unspecified: Secondary | ICD-10-CM

## 2011-06-26 DIAGNOSIS — R229 Localized swelling, mass and lump, unspecified: Secondary | ICD-10-CM

## 2011-06-26 NOTE — Consult Note (Addendum)
NEW PATIENT CONSULTATION  Victoria [name], Victoria [name] DOB:  August 08, 1961                                        June 26, 2011 CHART #:  [id number]  REASON FOR CONSULTATION:  Possible protruding sternal wire.  CLINICAL HISTORY:  I was asked by Dr. Clarene Duke to evaluate the patient for possibility of [name] protruding sternal wire causing [name] red spot in the sternotomy incision.  She is [name] 50 year old who underwent [name] complex replacement of her aortic arch, ascending aorta, and aortic valve as well as aorta-innominate and aorta-left common carotid artery bypass on January 02, 2010.  Her surgery was uncomplicated and she made good recovery.  She has been feeling well overall, has continued to abstain from smoking since surgery.  She said she recently noticed some swelling and redness in the lower portion of her sternotomy incision just beneath her breasts.  This looks somewhat like [name] boil and was [name] little tender. She said that it got larger over few days and then spontaneously drained some greenish liquid material.  It subsequently closed up and the tenderness and most of the redness have disappeared.  She denies any fever or chills.  She has had no other chest pains.  She denies any other symptoms.  PAST MEDICAL HISTORY:  Significant for the above cardiac surgery which was performed for severe aortic stenosis with 2 to 3+ aortic insufficiency and an ascending aortic aneurysm.  She did have [name] bicuspid aortic valve.  Her history is significant also for C-section in the past as well as hysterectomy.  She is status post left knee ACL repair of the past.  ALLERGIES:  None.  MEDICATIONS:  She is on Coumadin 7.5 mg daily, Benicar 10 mg daily, and Coreg 3.125 mg b.i.d.  FAMILY HISTORY:  Negative.  SOCIAL HISTORY:  She continues to work for [employer].  She has abstained from smoking since her original surgery.  She drinks occasional beer and denies any drug use.  PHYSICAL  EXAMINATION:  VITAL SIGNS:  Blood pressure is 129/81, pulse is 60 and regular, respiratory rate is 18 and unlabored, and oxygen saturation on room air is 98%.  GENERAL:  She looks well.  CARDIAC: Regular rate and rhythm with normal mechanical valve click.  There is no murmur.  LUNGS:  Clear.  CHEST:  The sternotomy incision is intact. There is [name] localized area in the lower portion between her breast that looks like small pimple.  There is no fluctuance and no erythema around it.  This is nontender.  It does not appear to be fixed to the underlying sternal wire.  Her sternal wires are palpable deep to the incision.  Sternum feels stable.  Chest x-ray showed all of her sternal wires to be intact in [name] normal position.  Lungs are clear.  Cardiac silhouette is normal.  IMPRESSION:  The patient has [name] small raised slightly red area between her breast within the sternotomy incision that could be [name] small pimple. She said that it was significantly larger until it drained.  This could be related to one of her sternal wires or she may have developed an infected hair follicle.  I do not see any indications for intervention at this time.  I told her that we would continue to follow this closely and if it began to swell up again, then she should  call my office immediately so we can bring her right in and take [name] look at it.  She is in agreement with that plan.  Evelene Croon, M.D. Electronically Signed  BB/MEDQ  D:  06/26/2011  T:  06/26/2011  Job:  [id number]

## 2011-07-06 ENCOUNTER — Emergency Department (HOSPITAL_COMMUNITY): Payer: BC Managed Care – PPO

## 2011-07-06 ENCOUNTER — Emergency Department (HOSPITAL_COMMUNITY)
Admission: EM | Admit: 2011-07-06 | Discharge: 2011-07-06 | Disposition: A | Discharge status: Home / Self Care | Payer: BC Managed Care – PPO | Attending: Emergency Medicine | Admitting: Emergency Medicine

## 2011-07-06 DIAGNOSIS — R079 Chest pain, unspecified: Secondary | ICD-10-CM | POA: Insufficient documentation

## 2011-07-06 DIAGNOSIS — R61 Generalized hyperhidrosis: Secondary | ICD-10-CM | POA: Insufficient documentation

## 2011-07-06 DIAGNOSIS — I2510 Atherosclerotic heart disease of native coronary artery without angina pectoris: Secondary | ICD-10-CM | POA: Insufficient documentation

## 2011-07-06 LAB — COMPREHENSIVE METABOLIC PANEL
ALT: 15 U/L (ref 0–35)
AST: 20 U/L (ref 0–37)
Albumin: 4.2 g/dL (ref 3.5–5.2)
Alkaline Phosphatase: 72 U/L (ref 39–117)
Chloride: 102 mEq/L (ref 96–112)
Potassium: 4.1 mEq/L (ref 3.5–5.1)
Total Bilirubin: 0.9 mg/dL (ref 0.3–1.2)

## 2011-07-06 LAB — PROTIME-INR: INR: 1.94 — ABNORMAL HIGH (ref 0.00–1.49)

## 2011-07-06 LAB — CBC
HCT: 34.8 % — ABNORMAL LOW (ref 36.0–46.0)
MCHC: 34.5 g/dL (ref 30.0–36.0)
MCV: 90.9 fL (ref 78.0–100.0)
RDW: 12.9 % (ref 11.5–15.5)

## 2011-07-06 LAB — TROPONIN I: Troponin I: <0.3 ng/mL (ref <0.30)

## 2011-07-06 LAB — DIFFERENTIAL
Basophils Absolute: 0 10*3/uL (ref 0.0–0.1)
Eosinophils Relative: 2 % (ref 0–5)
Lymphocytes Relative: 34 % (ref 12–46)
Monocytes Absolute: 0.7 10*3/uL (ref 0.1–1.0)

## 2011-07-06 LAB — CK TOTAL AND CKMB (NOT AT ARMC): Relative Index: INVALID (ref 0.0–2.5)

## 2011-07-06 MED ORDER — IOHEXOL 300 MG/ML  SOLN
80.0000 mL | Freq: Once | INTRAMUSCULAR | Status: AC | PRN
  Administered 2011-07-06: 80 mL via INTRAVENOUS

## 2011-10-16 ENCOUNTER — Ambulatory Visit: Payer: Self-pay

## 2012-10-14 ENCOUNTER — Ambulatory Visit: Payer: Self-pay

## 2013-03-13 ENCOUNTER — Ambulatory Visit: Payer: Self-pay | Admitting: Cardiology

## 2013-03-13 DIAGNOSIS — Z952 Presence of prosthetic heart valve: Secondary | ICD-10-CM

## 2013-03-13 DIAGNOSIS — Z7901 Long term (current) use of anticoagulants: Secondary | ICD-10-CM

## 2013-03-24 HISTORY — PX: TRANSTHORACIC ECHOCARDIOGRAM: SHX275

## 2013-03-27 ENCOUNTER — Other Ambulatory Visit (HOSPITAL_COMMUNITY): Payer: Self-pay | Admitting: Cardiology

## 2013-03-27 DIAGNOSIS — I359 Nonrheumatic aortic valve disorder, unspecified: Secondary | ICD-10-CM

## 2013-04-03 ENCOUNTER — Ambulatory Visit (HOSPITAL_COMMUNITY)
Admission: RE | Admit: 2013-04-03 | Discharge: 2013-04-03 | Disposition: A | Discharge status: Home / Self Care | Payer: PRIVATE HEALTH INSURANCE | Source: Ambulatory Visit | Attending: Cardiology | Admitting: Cardiology

## 2013-04-03 DIAGNOSIS — I359 Nonrheumatic aortic valve disorder, unspecified: Secondary | ICD-10-CM | POA: Insufficient documentation

## 2013-04-03 NOTE — Progress Notes (Signed)
[facility]   2D echo completed 04/03/2013.   Cindy Nyah Shepherd, RDCS  

## 2013-05-15 ENCOUNTER — Ambulatory Visit (INDEPENDENT_AMBULATORY_CARE_PROVIDER_SITE_OTHER): Payer: Self-pay | Admitting: Pharmacist Clinician (PhC)/ Clinical Pharmacy Specialist

## 2013-05-15 DIAGNOSIS — Z7901 Long term (current) use of anticoagulants: Secondary | ICD-10-CM

## 2013-05-15 DIAGNOSIS — Z954 Presence of other heart-valve replacement: Secondary | ICD-10-CM

## 2013-05-15 DIAGNOSIS — Z952 Presence of prosthetic heart valve: Secondary | ICD-10-CM

## 2013-05-28 ENCOUNTER — Encounter: Payer: Self-pay | Admitting: Cardiology

## 2013-06-04 ENCOUNTER — Ambulatory Visit (INDEPENDENT_AMBULATORY_CARE_PROVIDER_SITE_OTHER): Payer: Self-pay | Admitting: Pharmacist Clinician (PhC)/ Clinical Pharmacy Specialist

## 2013-06-04 DIAGNOSIS — Z954 Presence of other heart-valve replacement: Secondary | ICD-10-CM

## 2013-06-04 DIAGNOSIS — Z952 Presence of prosthetic heart valve: Secondary | ICD-10-CM

## 2013-06-04 DIAGNOSIS — Z7901 Long term (current) use of anticoagulants: Secondary | ICD-10-CM

## 2013-07-23 ENCOUNTER — Ambulatory Visit (INDEPENDENT_AMBULATORY_CARE_PROVIDER_SITE_OTHER): Payer: Self-pay | Admitting: Pharmacist

## 2013-07-23 DIAGNOSIS — Z954 Presence of other heart-valve replacement: Secondary | ICD-10-CM

## 2013-07-23 DIAGNOSIS — Z7901 Long term (current) use of anticoagulants: Secondary | ICD-10-CM

## 2013-07-23 DIAGNOSIS — Z952 Presence of prosthetic heart valve: Secondary | ICD-10-CM

## 2013-08-04 ENCOUNTER — Other Ambulatory Visit: Payer: Self-pay | Admitting: Pharmacist Clinician (PhC)/ Clinical Pharmacy Specialist

## 2013-08-04 MED ORDER — WARFARIN SODIUM 5 MG PO TABS: ORAL_TABLET | ORAL | Status: DC

## 2013-08-05 ENCOUNTER — Telehealth: Payer: Self-pay | Admitting: Cardiovascular Disease

## 2013-08-05 MED ORDER — WARFARIN SODIUM 5 MG PO TABS: ORAL_TABLET | ORAL | Status: DC

## 2013-08-05 NOTE — Telephone Encounter (Signed)
Patient called in refill for warfarin and it was ok'd by [place]  and sent back to [facility] on [address].  Per patient - pharmacy is not sure about the dosage and has sent back to [place] for clarification.  She needs it ready by 6:00 today to pick it up.

## 2013-08-05 NOTE — Telephone Encounter (Signed)
Message forwarded to K. Alvstad, PharmD.  

## 2013-08-27 LAB — PROTIME-INR: INR: 5.1 — AB (ref <=1.1)

## 2013-08-31 ENCOUNTER — Telehealth: Payer: Self-pay | Admitting: Cardiology

## 2013-08-31 ENCOUNTER — Ambulatory Visit (INDEPENDENT_AMBULATORY_CARE_PROVIDER_SITE_OTHER): Payer: Self-pay | Admitting: Pharmacist Clinician (PhC)/ Clinical Pharmacy Specialist

## 2013-08-31 DIAGNOSIS — Z952 Presence of prosthetic heart valve: Secondary | ICD-10-CM

## 2013-08-31 DIAGNOSIS — Z7901 Long term (current) use of anticoagulants: Secondary | ICD-10-CM

## 2013-08-31 DIAGNOSIS — Z954 Presence of other heart-valve replacement: Secondary | ICD-10-CM

## 2013-08-31 NOTE — Telephone Encounter (Signed)
Pt was calling regarding her coumadin. INR was a 5.1 she would like to know what to do.

## 2013-09-26 ENCOUNTER — Other Ambulatory Visit: Payer: Self-pay | Admitting: Cardiology

## 2013-09-29 NOTE — Telephone Encounter (Signed)
Rx was sent to pharmacy electronically. 

## 2013-10-05 ENCOUNTER — Encounter: Payer: Self-pay | Admitting: Cardiology

## 2013-10-21 ENCOUNTER — Ambulatory Visit: Payer: Self-pay | Admitting: General Practice

## 2013-11-26 ENCOUNTER — Ambulatory Visit (INDEPENDENT_AMBULATORY_CARE_PROVIDER_SITE_OTHER): Payer: BC Managed Care – PPO | Admitting: Pharmacist Clinician (PhC)/ Clinical Pharmacy Specialist

## 2013-11-26 DIAGNOSIS — Z954 Presence of other heart-valve replacement: Secondary | ICD-10-CM

## 2013-11-26 DIAGNOSIS — Z7901 Long term (current) use of anticoagulants: Secondary | ICD-10-CM

## 2013-11-26 DIAGNOSIS — Z952 Presence of prosthetic heart valve: Secondary | ICD-10-CM

## 2013-12-25 ENCOUNTER — Other Ambulatory Visit: Payer: Self-pay | Admitting: Cardiology

## 2013-12-30 LAB — PROTIME-INR: INR: 2.3 — AB (ref <=1.1)

## 2014-01-01 ENCOUNTER — Ambulatory Visit (INDEPENDENT_AMBULATORY_CARE_PROVIDER_SITE_OTHER): Payer: PRIVATE HEALTH INSURANCE | Admitting: Pharmacist Clinician (PhC)/ Clinical Pharmacy Specialist

## 2014-01-01 ENCOUNTER — Ambulatory Visit (INDEPENDENT_AMBULATORY_CARE_PROVIDER_SITE_OTHER): Payer: PRIVATE HEALTH INSURANCE | Admitting: Cardiology

## 2014-01-01 ENCOUNTER — Encounter: Payer: Self-pay | Admitting: Cardiology

## 2014-01-01 VITALS — BP 140/80 | HR 64 | Ht 60.5 in | Wt 124.0 lb

## 2014-01-01 DIAGNOSIS — I350 Nonrheumatic aortic (valve) stenosis: Secondary | ICD-10-CM

## 2014-01-01 DIAGNOSIS — I10 Essential (primary) hypertension: Secondary | ICD-10-CM

## 2014-01-01 DIAGNOSIS — I359 Nonrheumatic aortic valve disorder, unspecified: Secondary | ICD-10-CM

## 2014-01-01 DIAGNOSIS — Z7901 Long term (current) use of anticoagulants: Secondary | ICD-10-CM

## 2014-01-01 DIAGNOSIS — Z954 Presence of other heart-valve replacement: Secondary | ICD-10-CM

## 2014-01-01 DIAGNOSIS — Z952 Presence of prosthetic heart valve: Secondary | ICD-10-CM

## 2014-01-01 DIAGNOSIS — E785 Hyperlipidemia, unspecified: Secondary | ICD-10-CM

## 2014-01-01 MED ORDER — CARVEDILOL 3.125 MG PO TABS: ORAL_TABLET | ORAL | Status: DC

## 2014-01-01 MED ORDER — ATORVASTATIN CALCIUM 40 MG PO TABS: ORAL_TABLET | ORAL | Status: DC

## 2014-01-01 MED ORDER — AMLODIPINE BESYLATE 10 MG PO TABS: 10.0000 mg | ORAL_TABLET | Freq: Every day | ORAL | Status: DC

## 2014-01-01 NOTE — Patient Instructions (Signed)
Your physician has requested that you have an echocardiogram. Echocardiography is a painless test that uses sound waves to create images of your heart. It provides your doctor with information about the size and shape of your heart and how well your heart's chambers and valves are working. This procedure takes approximately one hour. There are no restrictions for this procedure.  Please schedule this for April 2015.   Your physician wants you to follow-up in: 1 year with Dr. Herbie BaltimoreHarding. You will receive a reminder letter in the mail two months in advance. If you don't receive a letter, please call our office to schedule the follow-up appointment.

## 2014-01-03 ENCOUNTER — Encounter: Payer: Self-pay | Admitting: Cardiology

## 2014-01-03 DIAGNOSIS — I10 Essential (primary) hypertension: Secondary | ICD-10-CM | POA: Insufficient documentation

## 2014-01-03 DIAGNOSIS — E785 Hyperlipidemia, unspecified: Secondary | ICD-10-CM | POA: Insufficient documentation

## 2014-01-03 NOTE — Assessment & Plan Note (Signed)
Blood pressure is borderline today. Has been relatively well-controlled the past. Most of the continue current dose of carvedilol and amlodipine.

## 2014-01-03 NOTE — Progress Notes (Signed)
PATIENT: Victoria [name] MRN: [id number]  DOB: 09/05/1961   DOV:01/03/2014 PCP: No PCP Per Patient  Clinic Note: Chief Complaint  Patient presents with  . Annual Exam    c/o pain in feet   HPI: Victoria [name] is a 53 y.o.  female with a PMH below who presents today for annual followup of bicuspid aortic valve with severe stenosis status post AVR/Bentall (St. Jude 21 mm Mechanical Valve) with Left Carotid Bypass in January 2011. Since her last visit, she had an echocardiogram done as the first postoperative echocardiogram. Results below.   Interval History:  She returns today doing relatively well with no major complaints. She denies any rest or is additional chest pressure/tightness or dyspnea. No PND, orthopnea or edema. No rapid or irregular heartbeats. No lightheadedness, dizziness or syncope/near syncope. No TIA/amaurosis fugax symptoms. No melena, hematochezia or hematuria on warfarin. No claudication.  Past Medical History  Diagnosis Date  . History of bicuspid aortic valve      with severe AS and dilated aortic root/arch.  . S/P AVR 12/2009    with Bentall procedure (St. Jude 21 mm mechanical valve, 24 mm IMA shield vascular graft for the ascending aorta/arch), L carotid artery bypassing  . Hyperlipidemia   . Hypertension     Prior Cardiac Evaluation and Past Surgical History: Past Surgical History  Procedure Laterality Date  . Aortic valve replacement  January 2011    St. Jude 21mm mechanical valve, Bentall procesure & L cartoid artery bypassing   . Bentall procedure  January 2011    See above  . Cardiac catheterization  December 2010    No significant coronary disease. Normal LV function.  . Transthoracic echocardiogram  March 2014    Normal LV size and thickness. EF 65-70%.; Grade 3 diastolic dysfunction with elevated LAP; 21 mm tilting-disc St. Jude valve functioning normally. No pannus. Mild-moderate stenosis with velocities higher than anticipated for valve type. Peak  gradient 35-39 mmHg, mean 20-24 mmHg.    Allergies  Allergen Reactions  . Lisinopril     Current Outpatient Prescriptions  Medication Sig Dispense Refill  . amLODipine (NORVASC) 10 MG tablet Take 1 tablet (10 mg total) by mouth daily.  90 tablet  3  . atorvastatin (LIPITOR) 40 MG tablet TAKE ONE TABLET BY MOUTH AT BEDTIME  90 tablet  3  . carvedilol (COREG) 3.125 MG tablet TAKE ONE TABLET BY MOUTH EVERY 12 HOURS  180 tablet  3  . warfarin (COUMADIN) 5 MG tablet Take 1-2 tablets by mouth daily as directed by coumadin clinic  150 tablet  1   No current facility-administered medications for this visit.    History   Social History Narrative   She is a divorced mother of one, grandmother 1. She usually exercises 4-5 days a week. Very active.   Quit smoking in 2011.   Takes 1-2 beers per day and is stable.   ROS: A comprehensive Review of Systems - Negative except Her feet and ankles are sore by the end of the day. Otherwise negative.  PHYSICAL EXAM BP 140/80  Pulse 64  Ht 5' 0.5" (1.537 m)  Wt 124 lb (56.246 kg)  BMI 23.81 kg/m2 General appearance: alert, cooperative, appears stated age and no distress Neck: no adenopathy, no carotid bruit, no JVD, supple, symmetrical, trachea midline, thyroid not enlarged, symmetric, no tenderness/mass/nodules and Radiated aortic murmur to carotid; normal carotid upstroke Lungs: clear to auscultation bilaterally, normal percussion bilaterally and Nonlabored, good air movement. Heart:  normal apical impulse, regular rate and rhythm, S1: normal, S2: normal prosthetic S2 and 3/6 SEM at the RUSB radiating to carotids. Abdomen: soft, non-tender; bowel sounds normal; no masses,  no organomegaly Extremities: extremities normal, atraumatic, no cyanosis or edema and no ulcers, gangrene or trophic changes Pulses: 2+ and symmetric Neurologic: Grossly normal  WUJ:[id number]:Performed today: Yes Rate: 64 , Rhythm:  NSR, nonspecific-ST-T abnormalities  Recent Labs:   None present  ASSESSMENT / PLAN: H/O aortic valve replacement Mechanical valve replacement working well, but gradients are higher than expected for valve size. I would repeat echocardiogram at least once this year in April timeframe. I will see if his consistency very near all this is medially the second echo since the surgery. As there was no evidence of pannus, I think this is mostly a size related gradient concern.  She remained asymptomatic.  Long term (current) use of anticoagulants Stable INR. Monitored by Phillips HayKristin Alvstad, RPH-CPP  Essential hypertension Blood pressure is borderline today. Has been relatively well-controlled the past. Most of the continue current dose of carvedilol and amlodipine.  Dyslipidemia, goal LDL below 130 Atorvastatin. Last checked in October by PCP. I do not have those labs. Most recent record labs available are from April 2014: TC 139, TG 78, HDL 54, LDL 69. Relatively well controlled. Continue statin.   Orders Placed This Encounter  Procedures  . EKG 12-Lead  . 2D Echocardiogram without contrast    Aortic stenosis,AVR    Standing Status: Future     Number of Occurrences:      Standing Expiration Date: 01/01/2015    Scheduling Instructions:     SCHEDULE 03/2014    Order Specific Question:  Type of Echo    Answer:  Complete    Order Specific Question:  Where should this test be performed    Answer:  [facility]    Order Specific Question:  Reason for exam-Echo    Answer:  Aortic Valve Disorder 424.1   Followup:  One year  DAVID W. Herbie BaltimoreHARDING, M.D., M.S. [facility] [address] [place], [place]  [id number]  [phone] Pager # [phone]

## 2014-01-03 NOTE — Assessment & Plan Note (Signed)
Atorvastatin. Last checked in October by PCP. I do not have those labs. Most recent record labs available are from April 2014: TC 139, TG 78, HDL 54, LDL 69. Relatively well controlled. Continue statin.

## 2014-01-03 NOTE — Assessment & Plan Note (Signed)
Stable INR. Monitored by Phillips HayKristin Alvstad, RPH-CPP

## 2014-01-03 NOTE — Assessment & Plan Note (Signed)
Mechanical valve replacement working well, but gradients are higher than expected for valve size. I would repeat echocardiogram at least once this year in April timeframe. I will see if his consistency very near all this is medially the second echo since the surgery. As there was no evidence of pannus, I think this is mostly a size related gradient concern.  She remained asymptomatic.

## 2014-02-17 LAB — PROTIME-INR

## 2014-02-18 LAB — PROTIME-INR: INR: 3.8 — AB (ref <=1.1)

## 2014-02-19 ENCOUNTER — Ambulatory Visit (INDEPENDENT_AMBULATORY_CARE_PROVIDER_SITE_OTHER): Payer: PRIVATE HEALTH INSURANCE | Admitting: Pharmacist Clinician (PhC)/ Clinical Pharmacy Specialist

## 2014-02-19 DIAGNOSIS — Z952 Presence of prosthetic heart valve: Secondary | ICD-10-CM

## 2014-02-19 DIAGNOSIS — Z954 Presence of other heart-valve replacement: Secondary | ICD-10-CM

## 2014-02-19 DIAGNOSIS — Z7901 Long term (current) use of anticoagulants: Secondary | ICD-10-CM

## 2014-03-25 LAB — PROTIME-INR: INR: 2.2 — AB (ref 0.9–1.1)

## 2014-03-31 ENCOUNTER — Ambulatory Visit (INDEPENDENT_AMBULATORY_CARE_PROVIDER_SITE_OTHER): Payer: PRIVATE HEALTH INSURANCE | Admitting: Pharmacist Clinician (PhC)/ Clinical Pharmacy Specialist

## 2014-03-31 DIAGNOSIS — Z954 Presence of other heart-valve replacement: Secondary | ICD-10-CM

## 2014-03-31 DIAGNOSIS — Z7901 Long term (current) use of anticoagulants: Secondary | ICD-10-CM

## 2014-03-31 DIAGNOSIS — Z952 Presence of prosthetic heart valve: Secondary | ICD-10-CM

## 2014-04-07 ENCOUNTER — Telehealth: Payer: Self-pay | Admitting: *Deleted

## 2014-04-07 NOTE — Telephone Encounter (Signed)
Returned call and pt verified x 2.  Pt informed message received and advised she can take otc antihistamine like Claritin or Zyrtec.  Advised she avoid any cold medications.  Pt verbalized understanding and agreed w/ plan.

## 2014-04-07 NOTE — Telephone Encounter (Signed)
Pt was calling in regards to her allergies. She does not want to take something that is going to affect her BP.   DH

## 2014-05-14 LAB — PROTIME-INR: INR: 2.6 — AB (ref 0.9–1.1)

## 2014-05-21 ENCOUNTER — Ambulatory Visit (INDEPENDENT_AMBULATORY_CARE_PROVIDER_SITE_OTHER): Payer: PRIVATE HEALTH INSURANCE | Admitting: Pharmacist Clinician (PhC)/ Clinical Pharmacy Specialist

## 2014-05-21 DIAGNOSIS — Z954 Presence of other heart-valve replacement: Secondary | ICD-10-CM

## 2014-05-21 DIAGNOSIS — Z952 Presence of prosthetic heart valve: Secondary | ICD-10-CM

## 2014-05-21 DIAGNOSIS — Z7901 Long term (current) use of anticoagulants: Secondary | ICD-10-CM

## 2014-06-15 LAB — POCT INR: INR: 3.6

## 2014-06-18 ENCOUNTER — Ambulatory Visit (INDEPENDENT_AMBULATORY_CARE_PROVIDER_SITE_OTHER): Payer: PRIVATE HEALTH INSURANCE | Admitting: Pharmacist Clinician (PhC)/ Clinical Pharmacy Specialist

## 2014-06-18 DIAGNOSIS — Z954 Presence of other heart-valve replacement: Secondary | ICD-10-CM

## 2014-06-18 DIAGNOSIS — Z7901 Long term (current) use of anticoagulants: Secondary | ICD-10-CM

## 2014-06-18 DIAGNOSIS — Z952 Presence of prosthetic heart valve: Secondary | ICD-10-CM

## 2014-06-21 ENCOUNTER — Ambulatory Visit: Payer: PRIVATE HEALTH INSURANCE | Admitting: Cardiology

## 2014-08-19 ENCOUNTER — Other Ambulatory Visit: Payer: Self-pay | Admitting: Cardiology

## 2014-08-24 ENCOUNTER — Telehealth: Payer: Self-pay | Admitting: Pharmacist Clinician (PhC)/ Clinical Pharmacy Specialist

## 2014-08-24 LAB — POCT INR: INR: 3.6

## 2014-08-24 NOTE — Telephone Encounter (Signed)
[facility] - reminder overdue for INR check

## 2014-08-27 ENCOUNTER — Ambulatory Visit (INDEPENDENT_AMBULATORY_CARE_PROVIDER_SITE_OTHER): Payer: PRIVATE HEALTH INSURANCE | Admitting: Pharmacist Clinician (PhC)/ Clinical Pharmacy Specialist

## 2014-08-27 DIAGNOSIS — Z7901 Long term (current) use of anticoagulants: Secondary | ICD-10-CM

## 2014-08-27 DIAGNOSIS — Z954 Presence of other heart-valve replacement: Secondary | ICD-10-CM

## 2014-08-27 DIAGNOSIS — Z952 Presence of prosthetic heart valve: Secondary | ICD-10-CM

## 2014-10-05 LAB — PROTIME-INR: INR: 2.6 — AB (ref 0.9–1.1)

## 2014-10-06 ENCOUNTER — Ambulatory Visit (INDEPENDENT_AMBULATORY_CARE_PROVIDER_SITE_OTHER): Payer: PRIVATE HEALTH INSURANCE | Admitting: Pharmacist Clinician (PhC)/ Clinical Pharmacy Specialist

## 2014-10-06 DIAGNOSIS — Z954 Presence of other heart-valve replacement: Secondary | ICD-10-CM

## 2014-10-06 DIAGNOSIS — Z952 Presence of prosthetic heart valve: Secondary | ICD-10-CM

## 2014-10-19 ENCOUNTER — Ambulatory Visit: Payer: Self-pay | Admitting: General Practice

## 2014-11-11 LAB — PROTIME-INR: INR: 3.1 — AB (ref 0.9–1.1)

## 2014-11-12 ENCOUNTER — Ambulatory Visit (INDEPENDENT_AMBULATORY_CARE_PROVIDER_SITE_OTHER): Payer: PRIVATE HEALTH INSURANCE | Admitting: Pharmacist Clinician (PhC)/ Clinical Pharmacy Specialist

## 2014-11-12 DIAGNOSIS — Z952 Presence of prosthetic heart valve: Secondary | ICD-10-CM

## 2014-11-12 DIAGNOSIS — Z954 Presence of other heart-valve replacement: Secondary | ICD-10-CM

## 2014-11-20 ENCOUNTER — Other Ambulatory Visit: Payer: Self-pay | Admitting: Pharmacist Clinician (PhC)/ Clinical Pharmacy Specialist

## 2014-12-08 LAB — PROTIME-INR: INR: 2.6 — AB (ref 0.9–1.1)

## 2014-12-13 ENCOUNTER — Ambulatory Visit (INDEPENDENT_AMBULATORY_CARE_PROVIDER_SITE_OTHER): Payer: PRIVATE HEALTH INSURANCE | Admitting: Pharmacist Clinician (PhC)/ Clinical Pharmacy Specialist

## 2014-12-13 DIAGNOSIS — Z952 Presence of prosthetic heart valve: Secondary | ICD-10-CM

## 2014-12-13 DIAGNOSIS — Z954 Presence of other heart-valve replacement: Secondary | ICD-10-CM

## 2014-12-31 ENCOUNTER — Other Ambulatory Visit: Payer: Self-pay | Admitting: *Deleted

## 2015-01-05 ENCOUNTER — Telehealth: Payer: Self-pay | Admitting: Cardiology

## 2015-01-06 ENCOUNTER — Other Ambulatory Visit: Payer: Self-pay | Admitting: *Deleted

## 2015-01-06 NOTE — Telephone Encounter (Signed)
RX denied RX was filled 01/06/15.

## 2015-01-06 NOTE — Telephone Encounter (Signed)
Pt called in stating that she is now out of her Carvedilol and would like it called in to the [facility] on [address]. Please call  Thanks

## 2015-01-06 NOTE — Telephone Encounter (Signed)
Done. Refill submitted to patient's preferred pharmacy. Informed patient. Pt voiced understanding, no other stated concerns at this time.

## 2015-01-13 ENCOUNTER — Other Ambulatory Visit: Payer: Self-pay | Admitting: Cardiology

## 2015-01-13 ENCOUNTER — Telehealth: Payer: Self-pay | Admitting: Cardiology

## 2015-01-13 NOTE — Telephone Encounter (Signed)
Close encounter 

## 2015-01-13 NOTE — Telephone Encounter (Signed)
Rx has been sent to the pharmacy electronically. ° °

## 2015-01-14 ENCOUNTER — Ambulatory Visit: Payer: PRIVATE HEALTH INSURANCE | Admitting: Cardiology

## 2015-01-19 ENCOUNTER — Encounter: Payer: Self-pay | Admitting: Cardiology

## 2015-01-19 ENCOUNTER — Ambulatory Visit (INDEPENDENT_AMBULATORY_CARE_PROVIDER_SITE_OTHER): Payer: PRIVATE HEALTH INSURANCE | Admitting: Cardiology

## 2015-01-19 VITALS — BP 172/90 | HR 64 | Ht 60.5 in | Wt 127.9 lb

## 2015-01-19 DIAGNOSIS — E785 Hyperlipidemia, unspecified: Secondary | ICD-10-CM

## 2015-01-19 DIAGNOSIS — Z7901 Long term (current) use of anticoagulants: Secondary | ICD-10-CM

## 2015-01-19 DIAGNOSIS — I10 Essential (primary) hypertension: Secondary | ICD-10-CM

## 2015-01-19 DIAGNOSIS — Z954 Presence of other heart-valve replacement: Secondary | ICD-10-CM

## 2015-01-19 DIAGNOSIS — Z952 Presence of prosthetic heart valve: Secondary | ICD-10-CM

## 2015-01-19 MED ORDER — CARVEDILOL 3.125 MG PO TABS: 3.1250 mg | ORAL_TABLET | Freq: Two times a day (BID) | ORAL | Status: DC

## 2015-01-19 MED ORDER — ATORVASTATIN CALCIUM 40 MG PO TABS: ORAL_TABLET | ORAL | Status: DC

## 2015-01-19 MED ORDER — AMLODIPINE BESYLATE 10 MG PO TABS: 10.0000 mg | ORAL_TABLET | Freq: Every day | ORAL | Status: DC

## 2015-01-19 MED ORDER — WARFARIN SODIUM 5 MG PO TABS: ORAL_TABLET | ORAL | Status: DC

## 2015-01-19 NOTE — Patient Instructions (Signed)
Dr Herbie BaltimoreHarding has ordered the following test(s) to be done: 1 2D Echocardiogram - Echocardiography is a painless test that uses sound waves to create images of your heart. It provides your doctor with information about the size and shape of your heart and how well your heart's chambers and valves are working. This procedure takes approximately one hour. There are no restrictions for this procedure.  Your physician has recommended to have your blood pressure checked regularly for a couple of weeks by Nurse Okey Regalarol at work. Please call and speak with the nurse and give your blood pressure readings. Depending on your readings, Dr Herbie BaltimoreHarding may have to start you on a blood pressure medication.  Dr Herbie BaltimoreHarding wants you to follow-up in 1 year. You will receive a reminder letter in the mail two months in advance. If you don't receive a letter, please call our office to schedule the follow-up appointment.

## 2015-01-21 ENCOUNTER — Encounter: Payer: Self-pay | Admitting: Cardiology

## 2015-01-21 NOTE — Assessment & Plan Note (Addendum)
She has a mechanical valve that appeared to be working well with elevated gradients. She was supposed of had an echocardiogram prior to this visit. It was not done. We will get one now to reassess for any significant change. The murmur itself does not sound more significant.

## 2015-01-21 NOTE — Assessment & Plan Note (Signed)
On statin. I don't have labs from PCP. Previously well controlled.

## 2015-01-21 NOTE — Assessment & Plan Note (Signed)
Significantly elevated pressures today. This is a new thing for her. She was a bit stressed try to get here on time and thinks it may be partly related to that. In the past she has had borderline pressures but I would like some more data. She has a easy access opportunity to have her blood pressure checked at work with her nurse. (Nurse Okey Regalarol). I will ask her to have her blood pressure checked a couple times over the next few days and neck week. She will then call and report her pressures to us. Mom if her blood pressures elevated we will have her come back earlier to adjust medications. She may come in to see Belenda CruiseKristin to discuss this when she has her INR checked as a prolonged visit.

## 2015-01-21 NOTE — Assessment & Plan Note (Signed)
She is anticoagulated with warfarin with her labs being followed here by our pharmacy team. -- Phillips HayKristin Alvstad RPH-CCP. Her INR has been stable. No bleeding issues.

## 2015-01-21 NOTE — Progress Notes (Signed)
PATIENT: Victoria [name] MRN: [id number]  DOB: [birth year]   DOV:01/21/2015 PCP: No PCP Per Patient  Clinic Note: Chief Complaint  Patient presents with  . Annual Exam    patient reports occas lightheadness/dizziness.  . Aortic Stenosis    Status post AVR   HPI: Victoria [name] is a 54 y.o.  female with a who presents today for annual followup of bicuspid aortic valve with severe stenosis status post AVR/Bentall (St. Jude 21 mm Mechanical Valve) with Left Carotid Bypass in January 2011. Her initial postoperative echocardiogram showed mild to moderate stenosis with elevated gradients as noted below. This was thought to be potentially related to size mismatch of the graft.   Interval History:  She presents today really the only complaint being intermittent episodes of feeling lightheaded and dizzy. This is usually positional. She denies any headaches or blurred vision but does note sparkles in her eyes acute occasion. From a cardiac standpoint however she denies any resting or exertional chest pressure/tightness or dyspnea. No heart failure symptoms of PND, orthopnea or edema. No sick be/near-syncope or TIA/amaurosis fugax symptoms. She is on long-term warfarin and denies any melena, hematochezia, hematuria or epistaxis. No myalgias on statin. But she does have some discomfort in her arms on occasion but nothing routine.  Past Medical History  Diagnosis Date  . History of bicuspid aortic valve      with severe AS and dilated aortic root/arch.  . S/P AVR 12/2009    with Bentall procedure (St. Jude 21 mm mechanical valve, 24 mm IMA shield vascular graft for the ascending aorta/arch), L carotid artery bypassing  . Hyperlipidemia   . Hypertension     Prior Cardiac Evaluation and Past Surgical History: Past Surgical History  Procedure Laterality Date  . Aortic valve replacement  January 2011    St. Jude 21mm mechanical valve, Bentall procesure & L cartoid artery bypassing   . Bentall  procedure  January 2011    See above  . Cardiac catheterization  December 2010    No significant coronary disease. Normal LV function.  . Transthoracic echocardiogram  April 2014    Normal LV size and thickness. EF 65-70%.; Grade 3 diastolic dysfunction with elevated LAP; 21 mm tilting-disc St. Jude valve functioning normally. No pannus. Mild-moderate stenosis with velocities higher than anticipated for valve type. Peak gradient 35-39 mmHg, mean 20-24 mmHg.    Allergies  Allergen Reactions  . Lisinopril     Current Outpatient Prescriptions  Medication Sig Dispense Refill  . amLODipine (NORVASC) 10 MG tablet Take 1 tablet (10 mg total) by mouth daily. 90 tablet 3  . atorvastatin (LIPITOR) 40 MG tablet TAKE ONE TABLET BY MOUTH AT BEDTIME 90 tablet 3  . carvedilol (COREG) 3.125 MG tablet Take 1 tablet (3.125 mg total) by mouth every 12 (twelve) hours. 180 tablet 3  . warfarin (COUMADIN) 5 MG tablet Take 1.5 to 2 tablets by mouth once daily per coumadin clinic. 180 tablet 3   No current facility-administered medications for this visit.   History   Social History Narrative   She is a divorced mother of one, grandmother 1. She usually exercises 4-5 days a week. Very active.   Quit smoking in 2011.   Takes 1-2 beers per day and is stable.   ROS: A comprehensive Review of Systems - was performed Review of Systems  Constitutional: Negative for weight loss and malaise/fatigue.  Eyes: Negative for blurred vision.       She says she  sees "sparkles at times when she closed her eyes and opens him up. She denies any blurred vision.  Cardiovascular: Negative for claudication.  Gastrointestinal: Negative for blood in stool and melena.  Genitourinary: Negative for hematuria.  Neurological: Negative for dizziness, focal weakness, loss of consciousness and headaches.  Endo/Heme/Allergies: Does not bruise/bleed easily.  Psychiatric/Behavioral: Negative.   All other systems reviewed and are  negative.  PHYSICAL EXAM BP 172/90 mmHg  Pulse 64  Ht 5' 0.5" (1.537 m)  Wt 127 lb 14.4 oz (58.015 kg)  BMI 24.56 kg/m2 General appearance: alert, cooperative, appears stated age and no distress Neck: no adenopathy, no carotid bruit, no JVD, supple, symmetrical, trachea midline, thyroid not enlarged, symmetric, no tenderness/mass/nodules and Radiated aortic murmur to carotid; normal carotid upstroke Lungs: clear to auscultation bilaterally, normal percussion bilaterally and Nonlabored, good air movement. Heart: normal apical impulse, regular rate and rhythm, S1: normal, S2: normal prosthetic S2 and 3/6 SEM at the RUSB radiating to carotids. Abdomen: soft, non-tender; bowel sounds normal; no masses,  no organomegaly Extremities: extremities normal, atraumatic, no cyanosis or edema and no ulcers, gangrene or trophic changes Pulses: 2+ and symmetric Neurologic: Grossly normal  ZOX:[id number]:Performed today: Yes Rate: 64 , Rhythm:  NSR, nonspecific-ST-T abnormalities - no change from previous EKG  Recent Labs:  None present  ASSESSMENT / PLAN: H/O aortic valve replacement She has a mechanical valve that appeared to be working well with elevated gradients. She was supposed of had an echocardiogram prior to this visit. It was not done. We will get one now to reassess for any significant change. The murmur itself does not sound more significant.   Long term current use of anticoagulant therapy She is anticoagulated with warfarin with her labs being followed here by our pharmacy team. -- Phillips HayKristin Alvstad RPH-CCP. Her INR has been stable. No bleeding issues.    Essential hypertension Significantly elevated pressures today. This is a new thing for her. She was a bit stressed try to get here on time and thinks it may be partly related to that. In the past she has had borderline pressures but I would like some more data. She has a easy access opportunity to have her blood pressure checked at work with her  nurse. (Nurse Okey Regalarol). I will ask her to have her blood pressure checked a couple times over the next few days and neck week. She will then call and report her pressures to us. Mom if her blood pressures elevated we will have her come back earlier to adjust medications. She may come in to see Belenda CruiseKristin to discuss this when she has her INR checked as a prolonged visit.    Dyslipidemia, goal LDL below 130 On statin. I don't have labs from PCP. Previously well controlled.    Orders Placed This Encounter  Procedures  . EKG 12-Lead  . 2D Echocardiogram without contrast    Standing Status: Future     Number of Occurrences:      Standing Expiration Date: 01/19/2016    Order Specific Question:  Type of Echo    Answer:  Complete    Order Specific Question:  Where should this test be performed    Answer:  [facility]    Order Specific Question:  Reason for exam-Echo    Answer:  Aortic Valve Disorder 424.1 / I35.9   Followup:  One year   Marykay LexHARDING, Adamae Ricklefs W, M.D., M.S. Interventional Cardiologist   Pager # [phone]

## 2015-02-01 ENCOUNTER — Ambulatory Visit (HOSPITAL_COMMUNITY)
Admission: RE | Admit: 2015-02-01 | Discharge: 2015-02-01 | Disposition: A | Discharge status: Home / Self Care | Payer: PRIVATE HEALTH INSURANCE | Source: Ambulatory Visit | Attending: Cardiology | Admitting: Cardiology

## 2015-02-01 DIAGNOSIS — I359 Nonrheumatic aortic valve disorder, unspecified: Secondary | ICD-10-CM | POA: Insufficient documentation

## 2015-02-01 DIAGNOSIS — Z952 Presence of prosthetic heart valve: Secondary | ICD-10-CM

## 2015-02-01 DIAGNOSIS — I350 Nonrheumatic aortic (valve) stenosis: Secondary | ICD-10-CM

## 2015-02-01 DIAGNOSIS — Z954 Presence of other heart-valve replacement: Secondary | ICD-10-CM

## 2015-02-02 NOTE — Progress Notes (Signed)
2D Echo Performed 02/01/2015   Ashanti Littles, RCS  

## 2015-02-04 ENCOUNTER — Telehealth: Payer: Self-pay | Admitting: *Deleted

## 2015-02-04 NOTE — Telephone Encounter (Signed)
-----   Message from Marykay Lexavid W Harding, MD sent at 02/03/2015 10:09 PM EST ----- Thanks - I couldn't see a leak.  Thought I was blind. Appreciate the help.  Jasmine DecemberSharon - we can let her know that valve looks OK.  DH

## 2015-02-04 NOTE — Telephone Encounter (Signed)
Spoke to patient. Result given . Verbalized understanding INFORMED PATIENT PLEASE HAVE PROTIME CHECK PER KRISTIN PATIENT VERBALIZED UNDERSTANDING- SHE STATE IT WAS 3.8 LAST MONTH.

## 2015-02-09 LAB — PROTIME-INR: INR: 2.7 — AB (ref 0.9–1.1)

## 2015-03-03 LAB — PROTIME-INR
INR: 2.9 — AB (ref 0.9–1.1)
INR: 2.9 — AB (ref <=1.1)

## 2015-03-07 ENCOUNTER — Ambulatory Visit (INDEPENDENT_AMBULATORY_CARE_PROVIDER_SITE_OTHER): Payer: PRIVATE HEALTH INSURANCE | Admitting: Pharmacist Clinician (PhC)/ Clinical Pharmacy Specialist

## 2015-03-07 DIAGNOSIS — Z954 Presence of other heart-valve replacement: Secondary | ICD-10-CM

## 2015-03-07 DIAGNOSIS — Z952 Presence of prosthetic heart valve: Secondary | ICD-10-CM

## 2015-03-07 DIAGNOSIS — Z7901 Long term (current) use of anticoagulants: Secondary | ICD-10-CM

## 2015-04-05 LAB — PROTIME-INR: INR: 2.5 — AB (ref <=1.1)

## 2015-04-06 ENCOUNTER — Ambulatory Visit (INDEPENDENT_AMBULATORY_CARE_PROVIDER_SITE_OTHER): Payer: PRIVATE HEALTH INSURANCE | Admitting: Pharmacist Clinician (PhC)/ Clinical Pharmacy Specialist

## 2015-04-06 DIAGNOSIS — Z952 Presence of prosthetic heart valve: Secondary | ICD-10-CM

## 2015-04-06 DIAGNOSIS — Z954 Presence of other heart-valve replacement: Secondary | ICD-10-CM

## 2015-04-06 DIAGNOSIS — Z7901 Long term (current) use of anticoagulants: Secondary | ICD-10-CM

## 2015-05-04 LAB — PROTIME-INR: INR: 3.2 — AB (ref 0.9–1.1)

## 2015-05-06 ENCOUNTER — Ambulatory Visit (INDEPENDENT_AMBULATORY_CARE_PROVIDER_SITE_OTHER): Payer: PRIVATE HEALTH INSURANCE | Admitting: Pharmacist Clinician (PhC)/ Clinical Pharmacy Specialist

## 2015-05-06 DIAGNOSIS — Z954 Presence of other heart-valve replacement: Secondary | ICD-10-CM

## 2015-05-06 DIAGNOSIS — Z952 Presence of prosthetic heart valve: Secondary | ICD-10-CM

## 2015-05-06 DIAGNOSIS — Z7901 Long term (current) use of anticoagulants: Secondary | ICD-10-CM

## 2015-05-30 LAB — PROTIME-INR: INR: 3.1 — AB (ref 0.9–1.1)

## 2015-05-31 LAB — PROTIME-INR: INR: 3.1 — AB (ref <=1.1)

## 2015-06-02 ENCOUNTER — Ambulatory Visit (INDEPENDENT_AMBULATORY_CARE_PROVIDER_SITE_OTHER): Payer: PRIVATE HEALTH INSURANCE | Admitting: Pharmacist Clinician (PhC)/ Clinical Pharmacy Specialist

## 2015-06-02 DIAGNOSIS — Z952 Presence of prosthetic heart valve: Secondary | ICD-10-CM

## 2015-06-02 DIAGNOSIS — Z7901 Long term (current) use of anticoagulants: Secondary | ICD-10-CM

## 2015-06-02 DIAGNOSIS — Z954 Presence of other heart-valve replacement: Secondary | ICD-10-CM

## 2015-07-04 LAB — PROTIME-INR: INR: 3.5 — AB (ref <=1.1)

## 2015-07-06 ENCOUNTER — Ambulatory Visit (INDEPENDENT_AMBULATORY_CARE_PROVIDER_SITE_OTHER): Payer: PRIVATE HEALTH INSURANCE | Admitting: Pharmacist Clinician (PhC)/ Clinical Pharmacy Specialist

## 2015-07-06 DIAGNOSIS — Z952 Presence of prosthetic heart valve: Secondary | ICD-10-CM

## 2015-07-06 DIAGNOSIS — Z7901 Long term (current) use of anticoagulants: Secondary | ICD-10-CM

## 2015-07-06 DIAGNOSIS — Z954 Presence of other heart-valve replacement: Secondary | ICD-10-CM

## 2015-07-16 ENCOUNTER — Ambulatory Visit (INDEPENDENT_AMBULATORY_CARE_PROVIDER_SITE_OTHER): Payer: PRIVATE HEALTH INSURANCE | Admitting: Family Medicine

## 2015-07-16 ENCOUNTER — Encounter: Payer: Self-pay | Admitting: Family Medicine

## 2015-07-16 VITALS — BP 134/72 | HR 66 | Temp 98.2°F | Resp 14 | Ht 60.5 in | Wt 125.0 lb

## 2015-07-16 DIAGNOSIS — L723 Sebaceous cyst: Secondary | ICD-10-CM | POA: Diagnosis not present

## 2015-07-16 NOTE — Progress Notes (Signed)
  Subjective:  Patient ID: Victoria [name], female    DOB: 17-Sep-1961  Age: 54 y.o. MRN: [id number]  54 year old lady who is here with a sister left axilla. It is been there for 6 months or so, visible, and a nuisance. Is not been infected and is not causing major problem. The patient has a history of having valvular heart disease, is on Coumadin, most recent INR 3.5. Otherwise is doing quite well.   Objective:   Pleasant alert lady in no acute distress. 1.5 cm diameter firm cyst superficial left anterior axilla. It is not inflamed and nontender.  Assessment & Plan:   Assessment: Sebaceous cyst left axilla  Plan: PA will excise the cyst Patient Instructions  Cyst Removal Your caregiver has removed a cyst. A cyst is a sac containing a semi-solid material. Cysts may occur any place on your body. They may remain small for years or gradually get larger. A sebaceous cyst is an enlarged (dilated) sweat gland filled with old sweat (sebum). Unattended, these may become large (the size of a softball) over several years time. These are often removed for improved appearance (cosmetic) reasons or before they become infected to form an abscess. An abscess is an infected cyst. HOME CARE INSTRUCTIONS   Keep your bandage clean and dry. You may change your bandage after 24 hours. If your bandage sticks, use warm water to gently loosen it. Pat the area dry with a clean towel before putting on another bandage.  If possible, keep the area where the cyst was removed raised to relieve soreness, swelling, and promote healing.  If you have stitches, keep them clean and dry.  You may clean your stitches gently with a cotton swab dipped in warm soapy water.  Do not soak the area where the cyst was removed or go swimming. You may shower.  Do not overuse the area where your cyst was removed.  Return in 7 days or as directed to have your stitches removed.  Take medicines as instructed by your caregiver. SEEK  IMMEDIATE MEDICAL CARE IF:   An oral temperature above 102 F (38.9 C) develops, not controlled by medication.  Blood continues to soak through the bandage.  You have increasing pain in the area where your cyst was removed.  You have redness, swelling, pus, a bad smell, soreness (inflammation), or red streaks coming away from the stitches. These are signs of infection. MAKE SURE YOU:   Understand these instructions.  Will watch your condition.  Will get help right away if you are not doing well or get worse. Document Released: 12/07/2000 Document Revised: 03/03/2012 Document Reviewed: 04/01/2008 [facility] Patient Information 2015 [place], [place]. This information is not intended to replace advice given to you by your health care provider. Make sure you discuss any questions you have with your health care provider.      HOPPER,DAVID, MD 07/16/2015

## 2015-07-16 NOTE — Progress Notes (Signed)
PROCEDURE NOTE: sebaceous cyst excision Verbal consent obtained. Local anesthesia with 4cc of 2% lidocaine with epinephrine. Sterile prep and drape. An elliptical incision was made extending ~1.5cm using a 15 blade, the cyst was dissected from the surrounding tissue with curved hemostats. Wound closed with #3 4-0 Ethilon simple interrupted sutures. Cleansed and dressed.  Victoria Bamberg, PA-C [facility] [phone] 07/16/2015  10:52 AM

## 2015-07-16 NOTE — Patient Instructions (Signed)
Cyst Removal   Your caregiver has removed a cyst. A cyst is a sac containing a semi-solid material. Cysts may occur any place on your body. They may remain small for years or gradually get larger. A sebaceous cyst is an enlarged (dilated) sweat gland filled with old sweat (sebum). Unattended, these may become large (the size of a softball) over several years time. These are often removed for improved appearance (cosmetic) reasons or before they become infected to form an abscess. An abscess is an infected cyst.   HOME CARE INSTRUCTIONS   Keep your bandage clean and dry. You may change your bandage after 24 hours. If your bandage sticks, use warm water to gently loosen it. Pat the area dry with a clean towel before putting on another bandage.   If possible, keep the area where the cyst was removed raised to relieve soreness, swelling, and promote healing.   If you have stitches, keep them clean and dry.   You may clean your stitches gently with a cotton swab dipped in warm soapy water.   Do not soak the area where the cyst was removed or go swimming. You may shower.   Do not overuse the area where your cyst was removed.   Return in 7 days or as directed to have your stitches removed.   Take medicines as instructed by your caregiver.  SEEK IMMEDIATE MEDICAL CARE IF:   An oral temperature above 102° F (38.9° C) develops, not controlled by medication.   Blood continues to soak through the bandage.   You have increasing pain in the area where your cyst was removed.   You have redness, swelling, pus, a bad smell, soreness (inflammation), or red streaks coming away from the stitches. These are signs of infection.  MAKE SURE YOU:   Understand these instructions.   Will watch your condition.   Will get help right away if you are not doing well or get worse.  Document Released: 12/07/2000 Document Revised: 03/03/2012 Document Reviewed: 04/01/2008   [employer]® Patient Information ©2015 [employer]. This information is not  intended to replace advice given to you by your health care provider. Make sure you discuss any questions you have with your health care provider.

## 2015-07-26 ENCOUNTER — Ambulatory Visit (INDEPENDENT_AMBULATORY_CARE_PROVIDER_SITE_OTHER): Payer: PRIVATE HEALTH INSURANCE | Admitting: Family Medicine

## 2015-07-26 VITALS — BP 110/68 | HR 65 | Temp 98.2°F | Resp 18

## 2015-07-26 DIAGNOSIS — Z7901 Long term (current) use of anticoagulants: Secondary | ICD-10-CM

## 2015-07-26 DIAGNOSIS — L723 Sebaceous cyst: Secondary | ICD-10-CM

## 2015-07-26 NOTE — Progress Notes (Signed)
    MRN: [id number] DOB: [birth year]  Subjective:   Victoria [name] is a 54 y.o. female presenting for follow up on sebaceous cyst excision. Reports that she has had daily bleeding of her wound, slight swelling. Of note, she is on lifetime coumadin therapy. Also, she has continued to work without restriction, does a lot of lifting and carrying daily. Reports that she tripped and stretched out her hand, felt like she aggravated her wound with this as well. Denies fever, erythema, drainage of pus. Denies any other aggravating or relieving factors, no other questions or concerns.  Victoria [name] has a current medication list which includes the following prescription(s): amlodipine, atorvastatin, carvedilol, and warfarin. She is allergic to lisinopril.  Victoria [name]  has a past medical history of History of bicuspid aortic valve; S/P AVR (12/2009); Hyperlipidemia; and Hypertension. Also  has past surgical history that includes Aortic valve replacement (January 2011); Bentall procedure (January 2011); Cardiac catheterization (December 2010); and transthoracic echocardiogram (April 2014).  ROS As in subjective.  Objective:   Vitals: BP 110/68 mmHg  Pulse 65  Temp(Src) 98.2 F (36.8 C) (Oral)  Resp 18  SpO2 99%  Physical Exam  Constitutional: She is oriented to person, place, and time. She appears well-developed and well-nourished.  Cardiovascular: Normal rate.   Musculoskeletal: She exhibits no edema.  Neurological: She is alert and oriented to person, place, and time.  Skin: Skin is warm and dry. No rash noted. No erythema. No pallor.      Assessment and Plan :   1. Sebaceous cyst of left axilla 2. Warfarin anticoagulation - Advised patient apply steri strips, pressure dressing, work restrictions provided for no use of left arm. - Recheck in 3 days.  Wallis Bamberg, PA-C [facility] [phone] 07/26/2015 5:55 PM

## 2015-07-26 NOTE — Patient Instructions (Addendum)
- Please change your steri strips if they fall off. - Change your dressing daily after your shower. - Follow the work restrictions provided.   Laceration Care, Adult A laceration is a cut or lesion that goes through all layers of the skin and into the tissue just beneath the skin. TREATMENT  Some lacerations may not require closure. Some lacerations may not be able to be closed due to an increased risk of infection. It is important to see your caregiver as soon as possible after an injury to minimize the risk of infection and maximize the opportunity for successful closure. If closure is appropriate, pain medicines may be given, if needed. The wound will be cleaned to help prevent infection. Your caregiver will use stitches (sutures), staples, wound glue (adhesive), or skin adhesive strips to repair the laceration. These tools bring the skin edges together to allow for faster healing and a better cosmetic outcome. However, all wounds will heal with a scar. Once the wound has healed, scarring can be minimized by covering the wound with sunscreen during the day for 1 full year. HOME CARE INSTRUCTIONS  For sutures or staples:  Keep the wound clean and dry.  If you were given a bandage (dressing), you should change it at least once a day. Also, change the dressing if it becomes wet or dirty, or as directed by your caregiver.  Wash the wound with soap and water 2 times a day. Rinse the wound off with water to remove all soap. Pat the wound dry with a clean towel.  After cleaning, apply a thin layer of the antibiotic ointment as recommended by your caregiver. This will help prevent infection and keep the dressing from sticking.  You may shower as usual after the first 24 hours. Do not soak the wound in water until the sutures are removed.  Only take over-the-counter or prescription medicines for pain, discomfort, or fever as directed by your caregiver.  Get your sutures or staples removed as  directed by your caregiver. For skin adhesive strips:  Keep the wound clean and dry.  Do not get the skin adhesive strips wet. You may bathe carefully, using caution to keep the wound dry.  If the wound gets wet, pat it dry with a clean towel.  Skin adhesive strips will fall off on their own. You may trim the strips as the wound heals. Do not remove skin adhesive strips that are still stuck to the wound. They will fall off in time. For wound adhesive:  You may briefly wet your wound in the shower or bath. Do not soak or scrub the wound. Do not swim. Avoid periods of heavy perspiration until the skin adhesive has fallen off on its own. After showering or bathing, gently pat the wound dry with a clean towel.  Do not apply liquid medicine, cream medicine, or ointment medicine to your wound while the skin adhesive is in place. This may loosen the film before your wound is healed.  If a dressing is placed over the wound, be careful not to apply tape directly over the skin adhesive. This may cause the adhesive to be pulled off before the wound is healed.  Avoid prolonged exposure to sunlight or tanning lamps while the skin adhesive is in place. Exposure to ultraviolet light in the first year will darken the scar.  The skin adhesive will usually remain in place for 5 to 10 days, then naturally fall off the skin. Do not pick at the adhesive  film. You may need a tetanus shot if:  You cannot remember when you had your last tetanus shot.  You have never had a tetanus shot. If you get a tetanus shot, your arm may swell, get red, and feel warm to the touch. This is common and not a problem. If you need a tetanus shot and you choose not to have one, there is a rare chance of getting tetanus. Sickness from tetanus can be serious. SEEK MEDICAL CARE IF:   You have redness, swelling, or increasing pain in the wound.  You see a red line that goes away from the wound.  You have yellowish-white fluid  (pus) coming from the wound.  You have a fever.  You notice a bad smell coming from the wound or dressing.  Your wound breaks open before or after sutures have been removed.  You notice something coming out of the wound such as wood or glass.  Your wound is on your hand or foot and you cannot move a finger or toe. SEEK IMMEDIATE MEDICAL CARE IF:   Your pain is not controlled with prescribed medicine.  You have severe swelling around the wound causing pain and numbness or a change in color in your arm, hand, leg, or foot.  Your wound splits open and starts bleeding.  You have worsening numbness, weakness, or loss of function of any joint around or beyond the wound.  You develop painful lumps near the wound or on the skin anywhere on your body. MAKE SURE YOU:   Understand these instructions.  Will watch your condition.  Will get help right away if you are not doing well or get worse. Document Released: 12/10/2005 Document Revised: 03/03/2012 Document Reviewed: 06/05/2011 [employer] Patient Information 2015 [place], [place]. This information is not intended to replace advice given to you by your health care provider. Make sure you discuss any questions you have with your health care provider.

## 2015-07-29 ENCOUNTER — Ambulatory Visit (INDEPENDENT_AMBULATORY_CARE_PROVIDER_SITE_OTHER): Payer: PRIVATE HEALTH INSURANCE | Admitting: Urgent Care

## 2015-07-29 VITALS — BP 122/72 | HR 68 | Temp 98.3°F | Resp 17 | Ht 60.5 in | Wt 125.0 lb

## 2015-07-29 DIAGNOSIS — L723 Sebaceous cyst: Secondary | ICD-10-CM | POA: Diagnosis not present

## 2015-07-29 DIAGNOSIS — Z7901 Long term (current) use of anticoagulants: Secondary | ICD-10-CM | POA: Diagnosis not present

## 2015-07-29 NOTE — Progress Notes (Signed)
    MRN: [id number] DOB: 1961-12-13  Subjective:   Victoria [name] is a 54 y.o. female presenting for chief complaint of Follow-up  Reports that her wound is significantly better. Has been changing steri-strips with minimal bleeding. She is following work restrictions and feels that this is helping tremendously since her job requires her to use her arms very actively. Denies fever, redness, pain, drainage of pus. Denies any other aggravating or relieving factors, no other questions or concerns.  Victoria [name] has a current medication list which includes the following prescription(s): amlodipine, atorvastatin, carvedilol, and warfarin. She is allergic to lisinopril.  Victoria [name]  has a past medical history of History of bicuspid aortic valve; S/P AVR (12/2009); Hyperlipidemia; and Hypertension. Also  has past surgical history that includes Aortic valve replacement (January 2011); Bentall procedure (January 2011); Cardiac catheterization (December 2010); and transthoracic echocardiogram (April 2014).  ROS As in subjective.  Objective:   Vitals: BP 122/72 mmHg  Pulse 68  Temp(Src) 98.3 F (36.8 C) (Oral)  Resp 17  Ht 5' 0.5" (1.537 m)  Wt 125 lb (56.7 kg)  BMI 24.00 kg/m2  SpO2 98%  Physical Exam  Constitutional: She is oriented to person, place, and time. She appears well-developed and well-nourished.  Cardiovascular: Normal rate.   Pulmonary/Chest: Effort normal.    Neurological: She is alert and oriented to person, place, and time.  Skin: Skin is warm and dry. No rash noted. No erythema. No pallor.  Psychiatric: She has a normal mood and affect.    WOUND CARE: Sebaceous cyst excision Steri-strip removed. Wound separated by small collection of coagulated blood. This was dissected carefully. Wound cleansed and dressed, approximated with 1/2" steri-strip. Pressure dressing applied.  Assessment and Plan :   1. Sebaceous cyst of left axilla - Stable, improving, change steri-strip daily  after showers. RTC in 4-5 days for re-evaluation. Maintain work restrictions.  2. Warfarin anticoagulation - Continue warfarin therapy  Wallis Bamberg, PA-C [facility] [phone] 07/29/2015 9:01 AM

## 2015-07-29 NOTE — Patient Instructions (Addendum)
WOUND CARE - Please change steri strips daily. Bring edges of wound as close together as possible.  - If you start bleeding or clot develops and separates the wound, return to our clinic. - Otherwise, your next wound care visit can be Tuesday, 08/02/2015 from 9am to 8:30pm or Wednesday 2pm to 8:30pm.

## 2015-08-02 ENCOUNTER — Ambulatory Visit (INDEPENDENT_AMBULATORY_CARE_PROVIDER_SITE_OTHER): Payer: PRIVATE HEALTH INSURANCE | Admitting: Urgent Care

## 2015-08-02 VITALS — BP 114/62 | HR 62 | Temp 98.7°F | Resp 14 | Ht 60.5 in | Wt 124.0 lb

## 2015-08-02 DIAGNOSIS — Z7901 Long term (current) use of anticoagulants: Secondary | ICD-10-CM | POA: Diagnosis not present

## 2015-08-02 DIAGNOSIS — L723 Sebaceous cyst: Secondary | ICD-10-CM | POA: Diagnosis not present

## 2015-08-02 NOTE — Patient Instructions (Signed)
-   Change steri-strip once it falls off but do not leave it there longer than 3 days. - If you develop redness, pain, drainage of pus, return to clinic for evaluation of infection.

## 2015-08-02 NOTE — Progress Notes (Signed)
    MRN: [id number] DOB: November 21, 1961  Subjective:   Victoria [name] is a 54 y.o. female presenting for chief complaint of Wound Check  Reports that she is doing well with steri strip changes. Denies fever, redness, swelling, bleeding. Denies any other aggravating or relieving factors, no other questions or concerns.  Victoria [name] has a current medication list which includes the following prescription(s): amlodipine, atorvastatin, carvedilol, and warfarin. She is allergic to lisinopril.  Victoria [name]  has a past medical history of History of bicuspid aortic valve; S/P AVR (12/2009); Hyperlipidemia; and Hypertension. Also  has past surgical history that includes Aortic valve replacement (January 2011); Bentall procedure (January 2011); Cardiac catheterization (December 2010); and transthoracic echocardiogram (April 2014).  ROS As in subjective.  Objective:   Vitals: BP 114/62 mmHg  Pulse 62  Temp(Src) 98.7 F (37.1 C) (Oral)  Resp 14  Ht 5' 0.5" (1.537 m)  Wt 124 lb (56.246 kg)  BMI 23.81 kg/m2  SpO2 98%  Physical Exam  Constitutional: She is oriented to person, place, and time. She appears well-developed and well-nourished.  Cardiovascular: Normal rate.   Pulmonary/Chest: Effort normal.  Neurological: She is alert and oriented to person, place, and time.  Skin: Skin is warm and dry. No rash noted. No erythema. No pallor.     Psychiatric: She has a normal mood and affect.   WOUND CARE Wound rinsed and cleaned with sterile water. Coagulated blood removed from wound. Steri strip applied, pressure dressing applied thereafter.  Assessment and Plan :   1. Sebaceous cyst of left axilla 2. Warfarin anticoagulation - Significantly improved, reviewed wound care instructions, provided with work restrictions. RTC in 1 week.  Wallis Bamberg, PA-C [facility] [phone] 08/02/2015 1:26 PM

## 2015-08-09 ENCOUNTER — Ambulatory Visit (INDEPENDENT_AMBULATORY_CARE_PROVIDER_SITE_OTHER): Payer: PRIVATE HEALTH INSURANCE | Admitting: Urgent Care

## 2015-08-09 VITALS — BP 128/60 | HR 70 | Temp 98.8°F | Resp 16 | Ht 65.0 in | Wt 125.0 lb

## 2015-08-09 DIAGNOSIS — L723 Sebaceous cyst: Secondary | ICD-10-CM

## 2015-08-09 DIAGNOSIS — Z7901 Long term (current) use of anticoagulants: Secondary | ICD-10-CM

## 2015-08-09 NOTE — Progress Notes (Signed)
    MRN: [id number] DOB: 07/09/1961  Subjective:   Victoria [name] is a 54 y.o. female presenting for follow up/wound care s/p excision of sebaceous cyst. Reports that the wound is significantly improved. She has followed work restrictions. Denies fever, pain, redness, swelling. Denies any other aggravating or relieving factors, no other questions or concerns.  Victoria [name] has a current medication list which includes the following prescription(s): amlodipine, atorvastatin, carvedilol, and warfarin. Also is allergic to lisinopril.  Victoria [name]  has a past medical history of History of bicuspid aortic valve; S/P AVR (12/2009); Hyperlipidemia; and Hypertension. Also  has past surgical history that includes Aortic valve replacement (January 2011); Bentall procedure (January 2011); Cardiac catheterization (December 2010); and transthoracic echocardiogram (April 2014).  Objective:   Vitals: BP 128/60 mmHg  Pulse 70  Temp(Src) 98.8 F (37.1 C) (Oral)  Resp 16  Ht  (1.651 m)  Wt 125 lb (56.7 kg)  BMI 20.80 kg/m2  SpO2 97%  Physical Exam  Constitutional: She is oriented to person, place, and time. She appears well-developed and well-nourished.  Cardiovascular: Normal rate.   Pulmonary/Chest: Effort normal.  Neurological: She is alert and oriented to person, place, and time.  Skin: Skin is warm and dry. No rash noted. No erythema. No pallor.     Psychiatric: She has a normal mood and affect.   Assessment and Plan :   1. Sebaceous cyst of left axilla - Significantly improved, anticipatory guidance provided. - Work restrictions for 1 more week provided. - Patient is to f/u in 1 week only if wound does not heal.  2. Warfarin anticoagulation - Continue f/u with PCP  Wallis Bamberg, PA-C [facility] [phone] 08/09/2015 6:42 PM

## 2015-08-09 NOTE — Patient Instructions (Signed)
Return to clinic only if your wound does not heal completely in 1 week.

## 2015-08-18 LAB — PROTIME-INR: INR: 3.3 — AB (ref 0.9–1.1)

## 2015-08-19 ENCOUNTER — Ambulatory Visit (INDEPENDENT_AMBULATORY_CARE_PROVIDER_SITE_OTHER): Payer: PRIVATE HEALTH INSURANCE | Admitting: Pharmacist Clinician (PhC)/ Clinical Pharmacy Specialist

## 2015-08-19 DIAGNOSIS — Z7901 Long term (current) use of anticoagulants: Secondary | ICD-10-CM

## 2015-08-19 DIAGNOSIS — Z952 Presence of prosthetic heart valve: Secondary | ICD-10-CM

## 2015-08-19 DIAGNOSIS — Z954 Presence of other heart-valve replacement: Secondary | ICD-10-CM

## 2015-09-20 ENCOUNTER — Other Ambulatory Visit: Payer: Self-pay | Admitting: Family Medicine

## 2015-09-21 LAB — CMP12+LP+TP+TSH+6AC+CBC/D/PLT
ALT: 24 IU/L (ref 0–32)
AST: 36 IU/L (ref 0–40)
Albumin/Globulin Ratio: 1.9 (ref 1.1–2.5)
Albumin: 4.5 g/dL (ref 3.5–5.5)
Alkaline Phosphatase: 66 IU/L (ref 39–117)
BUN / CREAT RATIO: 22 (ref 9–23)
BUN: 14 mg/dL (ref 6–24)
Basophils Absolute: 0 10*3/uL (ref 0.0–0.2)
Basos: 0 %
Bilirubin Total: 0.9 mg/dL (ref 0.0–1.2)
CALCIUM: 9.1 mg/dL (ref 8.7–10.2)
CHLORIDE: 102 mmol/L (ref 97–108)
CREATININE: 0.63 mg/dL (ref 0.57–1.00)
Chol/HDL Ratio: 2.6 ratio units (ref 0.0–4.4)
Cholesterol, Total: 130 mg/dL (ref 100–199)
EOS (ABSOLUTE): 0.2 10*3/uL (ref 0.0–0.4)
EOS: 3 %
Free Thyroxine Index: 1.9 (ref 1.2–4.9)
GFR calc Af Amer: 119 mL/min/{1.73_m2} (ref >59)
GFR, EST NON AFRICAN AMERICAN: 103 mL/min/{1.73_m2} (ref >59)
GGT: 15 IU/L (ref 0–60)
Globulin, Total: 2.4 g/dL (ref 1.5–4.5)
Glucose: 91 mg/dL (ref 65–99)
HDL: 50 mg/dL (ref >39)
HEMOGLOBIN: 12.8 g/dL (ref 11.1–15.9)
Hematocrit: 38.5 % (ref 34.0–46.6)
IRON: 52 ug/dL (ref 27–159)
Immature Grans (Abs): 0 10*3/uL (ref 0.0–0.1)
Immature Granulocytes: 0 %
LDH: 333 IU/L — ABNORMAL HIGH (ref 119–226)
LDL Calculated: 63 mg/dL (ref 0–99)
LYMPHS ABS: 1.2 10*3/uL (ref 0.7–3.1)
Lymphs: 18 %
MCH: 30.3 pg (ref 26.6–33.0)
MCHC: 33.2 g/dL (ref 31.5–35.7)
MCV: 91 fL (ref 79–97)
Monocytes Absolute: 0.6 10*3/uL (ref 0.1–0.9)
Monocytes: 9 %
NEUTROS ABS: 4.9 10*3/uL (ref 1.4–7.0)
Neutrophils: 70 %
PHOSPHORUS: 4.3 mg/dL (ref 2.5–4.5)
PLATELETS: 233 10*3/uL (ref 150–379)
POTASSIUM: 4.9 mmol/L (ref 3.5–5.2)
RBC: 4.23 x10E6/uL (ref 3.77–5.28)
RDW: 14.1 % (ref 12.3–15.4)
SODIUM: 139 mmol/L (ref 134–144)
T3 UPTAKE RATIO: 30 % (ref 24–39)
T4, Total: 6.2 ug/dL (ref 4.5–12.0)
TSH: 1.58 u[IU]/mL (ref 0.450–4.500)
Total Protein: 6.9 g/dL (ref 6.0–8.5)
Triglycerides: 84 mg/dL (ref 0–149)
Uric Acid: 5.9 mg/dL (ref 2.5–7.1)
VLDL Cholesterol Cal: 17 mg/dL (ref 5–40)
WBC: 7 10*3/uL (ref 3.4–10.8)

## 2015-09-21 LAB — PROTIME-INR
INR: 3.3 — AB (ref 0.8–1.2)
PROTHROMBIN TIME: 33.5 s — AB (ref 9.1–12.0)

## 2015-09-21 LAB — HGB A1C W/O EAG: Hgb A1c MFr Bld: 6 % — ABNORMAL HIGH (ref 4.8–5.6)

## 2015-09-22 ENCOUNTER — Other Ambulatory Visit: Payer: Self-pay

## 2015-10-05 ENCOUNTER — Encounter: Payer: Self-pay | Admitting: Cardiology

## 2015-11-09 ENCOUNTER — Other Ambulatory Visit: Payer: Self-pay | Admitting: Family Medicine

## 2015-11-10 LAB — PROTIME-INR
INR: 3.4 — ABNORMAL HIGH (ref 0.8–1.2)
PROTHROMBIN TIME: 34.2 s — AB (ref 9.1–12.0)

## 2015-11-14 ENCOUNTER — Ambulatory Visit (INDEPENDENT_AMBULATORY_CARE_PROVIDER_SITE_OTHER): Payer: PRIVATE HEALTH INSURANCE | Admitting: Pharmacist Clinician (PhC)/ Clinical Pharmacy Specialist

## 2015-11-14 DIAGNOSIS — Z952 Presence of prosthetic heart valve: Secondary | ICD-10-CM

## 2015-11-14 DIAGNOSIS — Z7901 Long term (current) use of anticoagulants: Secondary | ICD-10-CM

## 2015-11-14 DIAGNOSIS — Z954 Presence of other heart-valve replacement: Secondary | ICD-10-CM

## 2015-12-14 ENCOUNTER — Other Ambulatory Visit: Payer: Self-pay | Admitting: Family Medicine

## 2015-12-15 LAB — PROTIME-INR
INR: 2.9 — ABNORMAL HIGH (ref 0.8–1.2)
PROTHROMBIN TIME: 29.7 s — AB (ref 9.1–12.0)

## 2015-12-16 ENCOUNTER — Ambulatory Visit (INDEPENDENT_AMBULATORY_CARE_PROVIDER_SITE_OTHER): Payer: PRIVATE HEALTH INSURANCE | Admitting: Pharmacist Clinician (PhC)/ Clinical Pharmacy Specialist

## 2015-12-16 DIAGNOSIS — Z952 Presence of prosthetic heart valve: Secondary | ICD-10-CM

## 2015-12-16 DIAGNOSIS — Z7901 Long term (current) use of anticoagulants: Secondary | ICD-10-CM

## 2015-12-16 DIAGNOSIS — Z954 Presence of other heart-valve replacement: Secondary | ICD-10-CM

## 2016-01-10 ENCOUNTER — Other Ambulatory Visit: Payer: Self-pay | Admitting: Cardiology

## 2016-01-10 NOTE — Telephone Encounter (Signed)
Rx request sent to pharmacy.  

## 2016-01-20 ENCOUNTER — Other Ambulatory Visit: Payer: Self-pay | Admitting: Family Medicine

## 2016-01-21 LAB — PROTIME-INR
INR: 1.9 — ABNORMAL HIGH (ref 0.8–1.2)
Prothrombin Time: 19.2 s — ABNORMAL HIGH (ref 9.1–12.0)

## 2016-01-24 ENCOUNTER — Ambulatory Visit (INDEPENDENT_AMBULATORY_CARE_PROVIDER_SITE_OTHER): Payer: PRIVATE HEALTH INSURANCE | Admitting: Pharmacist Clinician (PhC)/ Clinical Pharmacy Specialist

## 2016-01-24 DIAGNOSIS — Z7901 Long term (current) use of anticoagulants: Secondary | ICD-10-CM

## 2016-01-24 DIAGNOSIS — Z954 Presence of other heart-valve replacement: Secondary | ICD-10-CM

## 2016-01-24 DIAGNOSIS — Z952 Presence of prosthetic heart valve: Secondary | ICD-10-CM

## 2016-02-10 ENCOUNTER — Other Ambulatory Visit: Payer: Self-pay | Admitting: Family Medicine

## 2016-02-10 ENCOUNTER — Other Ambulatory Visit: Payer: Self-pay | Admitting: Cardiology

## 2016-02-10 LAB — PROTIME-INR: INR: 2.4 — AB (ref 0.9–1.1)

## 2016-02-10 NOTE — Telephone Encounter (Signed)
Rx refill sent to pharmacy. 

## 2016-02-11 LAB — PROTIME-INR
INR: 2.4 — ABNORMAL HIGH (ref 0.8–1.2)
Prothrombin Time: 24.3 s — ABNORMAL HIGH (ref 9.1–12.0)

## 2016-02-15 ENCOUNTER — Ambulatory Visit (INDEPENDENT_AMBULATORY_CARE_PROVIDER_SITE_OTHER): Payer: PRIVATE HEALTH INSURANCE | Admitting: Pharmacist Clinician (PhC)/ Clinical Pharmacy Specialist

## 2016-02-15 DIAGNOSIS — Z7901 Long term (current) use of anticoagulants: Secondary | ICD-10-CM

## 2016-02-15 DIAGNOSIS — Z952 Presence of prosthetic heart valve: Secondary | ICD-10-CM

## 2016-02-15 DIAGNOSIS — Z954 Presence of other heart-valve replacement: Secondary | ICD-10-CM

## 2016-02-16 ENCOUNTER — Encounter: Payer: Self-pay | Admitting: Cardiology

## 2016-02-16 ENCOUNTER — Ambulatory Visit (INDEPENDENT_AMBULATORY_CARE_PROVIDER_SITE_OTHER): Payer: PRIVATE HEALTH INSURANCE | Admitting: Cardiology

## 2016-02-16 VITALS — BP 122/70 | HR 61 | Ht 60.5 in | Wt 115.5 lb

## 2016-02-16 DIAGNOSIS — Z954 Presence of other heart-valve replacement: Secondary | ICD-10-CM

## 2016-02-16 DIAGNOSIS — Z952 Presence of prosthetic heart valve: Secondary | ICD-10-CM

## 2016-02-16 DIAGNOSIS — I10 Essential (primary) hypertension: Secondary | ICD-10-CM

## 2016-02-16 DIAGNOSIS — E785 Hyperlipidemia, unspecified: Secondary | ICD-10-CM | POA: Diagnosis not present

## 2016-02-16 DIAGNOSIS — Z7901 Long term (current) use of anticoagulants: Secondary | ICD-10-CM | POA: Diagnosis not present

## 2016-02-16 MED ORDER — CARVEDILOL 3.125 MG PO TABS: 3.1250 mg | ORAL_TABLET | Freq: Two times a day (BID) | ORAL | Status: DC

## 2016-02-16 MED ORDER — ATORVASTATIN CALCIUM 40 MG PO TABS: 40.0000 mg | ORAL_TABLET | Freq: Every day | ORAL | Status: DC

## 2016-02-16 MED ORDER — WARFARIN SODIUM 5 MG PO TABS: ORAL_TABLET | ORAL | Status: DC

## 2016-02-16 MED ORDER — AMLODIPINE BESYLATE 10 MG PO TABS: 10.0000 mg | ORAL_TABLET | Freq: Every day | ORAL | Status: DC

## 2016-02-16 NOTE — Progress Notes (Signed)
PCP: No PCP Per Patient  Clinic Note: Chief Complaint  Patient presents with  . Annual Exam    pt states no Sx.  . Cardiac Valve Problem    History of AVR/Bentall with mechanical St. Jude 21 mm valve    HPI: Victoria [name] is a 55 y.o. female with a PMH below who presents today for annual followup of bicuspid aortic valve with severe stenosis status post AVR/Bentall (St. Jude 21 mm Mechanical Valve) with Left Carotid Bypass in January 2011. Her initial postoperative echocardiogram showed mild to moderate stenosis with elevated gradients as noted below. This was thought to be potentially related to size mismatch of the graft.. Former Dr. Clarene Duke patient.  LURLENE [name] was last seen on 01/19/2015  Recent Hospitalizations: None  Studies Reviewed:   Echocardiogram 02/01/2015: EF 60-65%. Normal EF with no wall motion abnormality. Mechanical prosthetic valve functioning normally. Mild regurgitation noted, but no perivalvular leak on review. Peak gradient across the valve is 35 mmHg with mean of 22 mmHg. Somewhat elevated for the valve, but related to size mismatch.  Interval History:  [name] presents today doing well with no complaints. She says she occasionally has some palpitations, but is not overly worried about them. They're very rare and not worrisome to her. Otherwise from a cardiac standpoint she remains totally stable.  Review of Cardiac Symptoms as follows:   No chest pain or shortness of breath with rest or exertion. No PND, orthopnea or edema. No palpitations, lightheadedness, dizziness, weakness or syncope/near syncope.  No TIA/amaurosis fugax symptoms.  No melena, hematochezia, hematuria, or epstaxis.  No claudication.  ROS: A comprehensive was performed. Review of Systems  Constitutional: Positive for weight loss (Staying active with exercise). Negative for malaise/fatigue.  HENT: Negative.   Respiratory: Negative.   Cardiovascular: Negative.        Per  history of present illness  Gastrointestinal: Negative.   Genitourinary: Negative for dysuria.  Musculoskeletal: Negative.   Neurological: Negative.   Endo/Heme/Allergies: Does not bruise/bleed easily.  All other systems reviewed and are negative.   Past Medical History  Diagnosis Date  . History of bicuspid aortic valve      with severe AS and dilated aortic root/arch.  . S/P AVR 12/2009    with Bentall procedure (St. Jude 21 mm mechanical valve, 24 mm IMA shield vascular graft for the ascending aorta/arch), L carotid artery bypassing  . Hyperlipidemia   . Hypertension     Past Surgical History  Procedure Laterality Date  . Aortic valve replacement  January 2011    St. Jude 21mm mechanical valve, Bentall procesure & L cartoid artery bypassing   . Bentall procedure  January 2011    See above  . Cardiac catheterization  December 2010    No significant coronary disease. Normal LV function.  . Transthoracic echocardiogram  April 2014    Normal LV size and thickness. EF 65-70%.; Grade 3 diastolic dysfunction with elevated LAP; 21 mm tilting-disc St. Jude valve functioning normally. No pannus. Mild-moderate stenosis with velocities higher than anticipated for valve type. Peak gradient 35-39 mmHg, mean 20-24 mmHg.  . Transthoracic echocardiogram  January 2016    Normal EF (60-65%), Normal wall motion. 21 mm St Jude Mech AoV Prosthesis- well seated, mild AI (not peri-valvular leak as originally noted). Peak-Mean Gradients: 35 mmHg & 22 mHg (likely size related).  Stable    Prior to Admission medications   Medication Sig Start Date End Date Taking? Authorizing Provider  amLODipine (  NORVASC) 10 MG tablet TAKE ONE TABLET BY MOUTH ONCE DAILY 02/10/16  Yes [name], MD  atorvastatin (LIPITOR) 40 MG tablet TAKE ONE TABLET BY MOUTH AT BEDTIME 01/10/16  Yes [name], MD  carvedilol (COREG) 3.125 MG tablet Take 1 tablet (3.125 mg total) by mouth every 12 (twelve) hours. 01/19/15  Yes  [name], MD  warfarin (COUMADIN) 5 MG tablet Take 1.5 to 2 tablets by mouth once daily per coumadin clinic. 01/19/15  Yes [name], MD   Allergies  Allergen Reactions  . Lisinopril     Social History   Social History  . Marital Status: Single    Spouse Name: N/A  . Number of Children: 1  . Years of Education: N/A   Occupational History  .  [employer]   Social History Main Topics  . Smoking status: Former Smoker    Types: Cigarettes    Quit date: 01/01/2010  . Smokeless tobacco: Never Used  . Alcohol Use: 6.0 oz/week    12 drink(s) per week  . Drug Use: No  . Sexual Activity: Not Asked   Other Topics Concern  . None   Social History Narrative   She is a divorced mother of one, grandmother 1. She usually exercises 4-5 days a week. Very active.   Quit smoking in 2011.   Takes 1-2 beers per day and is stable.   History reviewed. No pertinent family history.   Wt Readings from Last 3 Encounters:  02/16/16 115 lb 8 oz (52.39 kg)  08/09/15 125 lb (56.7 kg)  08/02/15 124 lb (56.246 kg)    PHYSICAL EXAM BP 122/70 mmHg  Pulse 61  Ht 5' 0.5" (1.537 m)  Wt 115 lb 8 oz (52.39 kg)  BMI 22.18 kg/m2 General appearance: alert, cooperative, appears stated age and no distress Neck: no adenopathy, no carotid bruit, no JVD, supple, symmetrical, trachea midline, thyroid not enlarged, symmetric, no tenderness/mass/nodules and Radiated aortic murmur to carotid; normal carotid upstroke Lungs: clear to auscultation bilaterally, normal percussion bilaterally and Nonlabored, good air movement. Heart: normal apical impulse, regular rate and rhythm, S1: normal, S2: normal prosthetic S2 and 3/6 SEM at the RUSB radiating to carotids. Abdomen: soft, non-tender; bowel sounds normal; no masses, no organomegaly Extremities: extremities normal, atraumatic, no cyanosis or edema and no ulcers, gangrene or trophic changes Pulses: 2+ and symmetric Neurologic: Grossly normal    Adult ECG Report  Rate: 61 ;  Rhythm: normal sinus rhythm and Normal axis, intervals and durations.;   Narrative Interpretation: Normal EKG   Other studies Reviewed: Additional studies/ records that were reviewed today include:  Recent Labs: Labs through work for [employer]  Component Value Date   CHOL 130 09/20/2015   HDL 50 09/20/2015   LDLCALC 63 09/20/2015   TRIG 84 09/20/2015   CHOLHDL 2.6 09/20/2015    ASSESSMENT / PLAN: Problem List Items Addressed This Visit    Long term current use of anticoagulant therapy (Chronic)    Follow here in our clinic by Phillips Hay RPH-CCP. No bleeding issues with stable INR      Relevant Orders   EKG 12-Lead (Completed)   H/O aortic valve replacement (Chronic)    Mechanical valve that sounds like is working quite well. There are elevated gradients, likely related to size mismatch. I updated the past surgical history with the results. I don't think we need to follow-up another one for at least another 2 years, unless the murmur worsens  or symptoms occur.      Relevant Orders   EKG 12-Lead (Completed)   Essential hypertension (Chronic)    Blood pressure is great today. She is on amlodipine at 10 mg along with carvedilol with excellent rate control and blood pressure.      Relevant Medications   carvedilol (COREG) 3.125 MG tablet   atorvastatin (LIPITOR) 40 MG tablet   amLODipine (NORVASC) 10 MG tablet   warfarin (COUMADIN) 5 MG tablet   Other Relevant Orders   EKG 12-Lead (Completed)   Dyslipidemia, goal LDL below 130 - Primary (Chronic)    Labs from her insurance were drawn in September. Excellent control on atorvastatin.      Relevant Medications   carvedilol (COREG) 3.125 MG tablet   atorvastatin (LIPITOR) 40 MG tablet   amLODipine (NORVASC) 10 MG tablet   warfarin (COUMADIN) 5 MG tablet   Other Relevant Orders   EKG 12-Lead (Completed)      Current medicines are reviewed at length with the patient today.  (+/- concerns) none The following changes have been made: None  Studies Ordered:   Orders Placed This Encounter  Procedures  . EKG 12-Lead    One-year follow-up    HARDING, Piedad Climes, M.D., M.S. Interventional Cardiologist   Pager # [phone] Phone # [phone] [address] [place], [place] [address]

## 2016-02-16 NOTE — Patient Instructions (Signed)
No changes with Medications     Your physician wants you to follow-up in: 1 YEAR WITH DR HARDING. You will receive a reminder letter in the mail two months in advance. If you don't receive a letter, please call our office to schedule the follow-up appointment.

## 2016-02-18 ENCOUNTER — Encounter: Payer: Self-pay | Admitting: Cardiology

## 2016-02-18 NOTE — Assessment & Plan Note (Signed)
Blood pressure is great today. She is on amlodipine at 10 mg along with carvedilol with excellent rate control and blood pressure.

## 2016-02-18 NOTE — Assessment & Plan Note (Signed)
Labs from her insurance were drawn in September. Excellent control on atorvastatin.

## 2016-02-18 NOTE — Assessment & Plan Note (Addendum)
Mechanical valve that sounds like is working quite well. There are elevated gradients, likely related to size mismatch. I updated the past surgical history with the results. I don't think we need to follow-up another one for at least another 2 years, unless the murmur worsens or symptoms occur.

## 2016-02-18 NOTE — Assessment & Plan Note (Signed)
Follow here in our clinic by Phillips Hay RPH-CCP. No bleeding issues with stable INR

## 2016-03-06 ENCOUNTER — Encounter: Payer: Self-pay | Admitting: Pharmacist Clinician (PhC)/ Clinical Pharmacy Specialist

## 2016-03-06 DIAGNOSIS — Z952 Presence of prosthetic heart valve: Secondary | ICD-10-CM

## 2016-03-06 DIAGNOSIS — Z7901 Long term (current) use of anticoagulants: Secondary | ICD-10-CM

## 2016-03-06 NOTE — Progress Notes (Signed)
This encounter was created in error - please disregard.

## 2016-03-19 ENCOUNTER — Other Ambulatory Visit: Payer: Self-pay | Admitting: Cardiology

## 2016-03-19 LAB — PROTIME-INR: INR: 2.6 — AB (ref <=1.1)

## 2016-03-20 ENCOUNTER — Ambulatory Visit (INDEPENDENT_AMBULATORY_CARE_PROVIDER_SITE_OTHER): Payer: PRIVATE HEALTH INSURANCE | Admitting: Pharmacist Clinician (PhC)/ Clinical Pharmacy Specialist

## 2016-03-20 DIAGNOSIS — Z954 Presence of other heart-valve replacement: Secondary | ICD-10-CM

## 2016-03-20 DIAGNOSIS — Z952 Presence of prosthetic heart valve: Secondary | ICD-10-CM

## 2016-03-20 DIAGNOSIS — Z7901 Long term (current) use of anticoagulants: Secondary | ICD-10-CM

## 2016-03-20 LAB — PROTIME-INR
INR: 2.6 — AB (ref 0.8–1.2)
PROTHROMBIN TIME: 26.5 s — AB (ref 9.1–12.0)

## 2016-05-07 ENCOUNTER — Other Ambulatory Visit: Payer: Self-pay

## 2016-05-07 LAB — PROTIME-INR: INR: 2.8 — AB (ref 0.9–1.1)

## 2016-05-08 ENCOUNTER — Ambulatory Visit (INDEPENDENT_AMBULATORY_CARE_PROVIDER_SITE_OTHER): Payer: PRIVATE HEALTH INSURANCE | Admitting: Pharmacist

## 2016-05-08 DIAGNOSIS — Z7901 Long term (current) use of anticoagulants: Secondary | ICD-10-CM

## 2016-05-08 DIAGNOSIS — Z952 Presence of prosthetic heart valve: Secondary | ICD-10-CM

## 2016-05-08 DIAGNOSIS — Z954 Presence of other heart-valve replacement: Secondary | ICD-10-CM

## 2016-05-08 LAB — PROTIME-INR
INR: 2.8 — ABNORMAL HIGH (ref 0.8–1.2)
PROTHROMBIN TIME: 28.3 s — AB (ref 9.1–12.0)

## 2016-06-18 LAB — PROTIME-INR: INR: 2.6 — AB (ref <=1.1)

## 2016-06-20 ENCOUNTER — Ambulatory Visit (INDEPENDENT_AMBULATORY_CARE_PROVIDER_SITE_OTHER): Payer: PRIVATE HEALTH INSURANCE | Admitting: Pharmacist Clinician (PhC)/ Clinical Pharmacy Specialist

## 2016-06-20 DIAGNOSIS — Z7901 Long term (current) use of anticoagulants: Secondary | ICD-10-CM

## 2016-06-20 DIAGNOSIS — Z952 Presence of prosthetic heart valve: Secondary | ICD-10-CM

## 2016-06-20 DIAGNOSIS — Z954 Presence of other heart-valve replacement: Secondary | ICD-10-CM

## 2016-07-24 ENCOUNTER — Other Ambulatory Visit: Payer: Self-pay | Admitting: Family Medicine

## 2016-07-25 LAB — PROTIME-INR
INR: 3.8 — AB (ref 0.8–1.2)
PROTHROMBIN TIME: 38.4 s — AB (ref 9.1–12.0)

## 2016-07-27 ENCOUNTER — Ambulatory Visit (INDEPENDENT_AMBULATORY_CARE_PROVIDER_SITE_OTHER): Payer: PRIVATE HEALTH INSURANCE | Admitting: Pharmacist

## 2016-07-27 ENCOUNTER — Telehealth: Payer: Self-pay | Admitting: Cardiology

## 2016-07-27 DIAGNOSIS — Z7901 Long term (current) use of anticoagulants: Secondary | ICD-10-CM

## 2016-07-27 DIAGNOSIS — Z954 Presence of other heart-valve replacement: Secondary | ICD-10-CM

## 2016-07-27 DIAGNOSIS — Z952 Presence of prosthetic heart valve: Secondary | ICD-10-CM

## 2016-07-27 NOTE — Telephone Encounter (Signed)
LMTCB as pt is overdue for INR per our record.   INR received - see anticoag note from 07/27/16  Goal range faxed to [employer] for file

## 2016-07-27 NOTE — Telephone Encounter (Signed)
New message     The company needs a fax of the pt's PT/INR Range to go in the pt's chart please    Fax number Attn: Hailey  [phone]

## 2016-09-04 ENCOUNTER — Other Ambulatory Visit: Payer: Self-pay | Admitting: Cardiology

## 2016-09-04 ENCOUNTER — Other Ambulatory Visit: Payer: Self-pay | Admitting: Family Medicine

## 2016-09-04 LAB — PROTIME-INR: INR: 2.3 — AB (ref <=1.1)

## 2016-09-05 LAB — PROTIME-INR
INR: 2.3 — AB (ref 0.8–1.2)
Prothrombin Time: 22.9 s — ABNORMAL HIGH (ref 9.1–12.0)

## 2016-09-06 ENCOUNTER — Ambulatory Visit (INDEPENDENT_AMBULATORY_CARE_PROVIDER_SITE_OTHER): Payer: PRIVATE HEALTH INSURANCE | Admitting: Pharmacist Clinician (PhC)/ Clinical Pharmacy Specialist

## 2016-09-06 DIAGNOSIS — Z954 Presence of other heart-valve replacement: Secondary | ICD-10-CM

## 2016-09-06 DIAGNOSIS — Z952 Presence of prosthetic heart valve: Secondary | ICD-10-CM

## 2016-09-06 DIAGNOSIS — Z7901 Long term (current) use of anticoagulants: Secondary | ICD-10-CM

## 2016-10-03 ENCOUNTER — Other Ambulatory Visit: Payer: Self-pay | Admitting: Cardiology

## 2016-10-08 ENCOUNTER — Encounter: Payer: Self-pay | Admitting: Internal Medicine

## 2016-10-08 ENCOUNTER — Ambulatory Visit (INDEPENDENT_AMBULATORY_CARE_PROVIDER_SITE_OTHER): Payer: PRIVATE HEALTH INSURANCE | Admitting: Internal Medicine

## 2016-10-08 DIAGNOSIS — I10 Essential (primary) hypertension: Secondary | ICD-10-CM

## 2016-10-08 DIAGNOSIS — Z952 Presence of prosthetic heart valve: Secondary | ICD-10-CM

## 2016-10-08 DIAGNOSIS — E785 Hyperlipidemia, unspecified: Secondary | ICD-10-CM | POA: Diagnosis not present

## 2016-10-08 NOTE — Progress Notes (Signed)
HPI  Pt presents to the clinic today to establish care and for management of the conditions listed below. She has not had a PCP in many years.  HTN: BP well controlled on Amlodipine. Her BP today is 124/80. ECG from 01/2016 reviewed.  S/P AVR: She follows with Dr. Herbie BaltimoreHarding. She is taking Carvedilol and Coumadin. She has her INR's checked monthly.  HLD: She takes Lipitor as prescribed. She denies myalgias. She tries to consume a low fat diet.  Flu: 09/2015 Tetanus: not sure Mammogram: 09/2015 Pap Smear: Total Hysterectomy Colon Screening: never Vision Screening: as needed Dentist: biannually  Past Medical History:  Diagnosis Date  . History of bicuspid aortic valve     with severe AS and dilated aortic root/arch.  . Hyperlipidemia   . Hypertension   . 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 amLODipine (NORVASC) 10 MG tablet Take 1 tablet (10 mg total) by mouth daily. 90 tablet 3  . atorvastatin (LIPITOR) 40 MG tablet TAKE 1 TABLET BY MOUTH AT BEDTIME 90 tablet 1  . carvedilol (COREG) 3.125 MG tablet Take 1 tablet (3.125 mg total) by mouth every 12 (twelve) hours. 180 tablet 3  . warfarin (COUMADIN) 5 MG tablet TAKE 1 1/2 TO 2 TABLETS BY MOUTH ONCE DAILY PER COUMADIN CLINIC 180 tablet 0   No current facility-administered medications for this visit.     Allergies  Allergen Reactions  . Lisinopril     Family History  Problem Relation Age of Onset  . Stroke Father   . Hypertension Father   . Breast cancer Maternal Grandmother   . Lung cancer Maternal Grandmother     Social History   Social History  . Marital status: Single    Spouse name: N/A  . Number of children: 1  . Years of education: N/A   Occupational History  .  [employer]   Social History Main Topics  .  Smoking status: Former Smoker    Types: Cigarettes    Quit date: 01/01/2010  . Smokeless tobacco: Never Used  . Alcohol use 18.0 oz/week    12 Standard drinks or equivalent, 18 Cans of beer per week  . Drug use: No  . Sexual activity: Yes    Birth control/ protection: Surgical   Other Topics Concern  . Not on file   Social History Narrative   She is a divorced mother of one, grandmother 1. She usually exercises 4-5 days a week. Very active.   Quit smoking in 2011.   Takes 1-2 beers per day and is stable.    ROS:  Constitutional: Denies fever, malaise, fatigue, headache or abrupt weight changes.  Respiratory: Denies difficulty breathing, shortness of breath, cough or sputum production.   Cardiovascular: Denies chest pain, chest tightness, palpitations or swelling in the hands or feet.  Neurological: Denies dizziness, difficulty with memory, difficulty with speech or problems with balance and coordination.  Psych: Denies anxiety, depression, SI/HI.  No other specific complaints in a complete review of systems (except as listed in HPI above).  PE:  BP 124/80   Pulse (!) 57   Temp 98.5 F (36.9 C) (Oral)   Ht 5' 0.33" (1.532 m)   Wt 117 lb 8 oz (53.3 kg)   SpO2 98%   BMI 22.70 kg/m  Wt  Readings from Last 3 Encounters:  10/08/16 117 lb 8 oz (53.3 kg)  02/16/16 115 lb 8 oz (52.4 kg)  08/09/15 125 lb (56.7 kg)    General: Appears her stated age, well developed, well nourished in NAD. Cardiovascular: Normal rate and rhythm. Click and murmur noted. No JVD or BLE edema. No carotid bruits noted. Pulmonary/Chest: Normal effort and positive vesicular breath sounds. No respiratory distress. No wheezes, rales or ronchi noted.   Neurological: Alert and oriented.  Psychiatric: Mood and affect normal. Behavior is normal. Judgment and thought content normal.    BMET    Component Value Date/Time   NA 139 09/20/2015 0745   K 4.9 09/20/2015 0745   CL 102 09/20/2015 0745   CO2 27  07/06/2011 1147   GLUCOSE 91 09/20/2015 0745   GLUCOSE 100 (H) 07/06/2011 1147   BUN 14 09/20/2015 0745   CREATININE 0.63 09/20/2015 0745   CALCIUM 9.1 09/20/2015 0745   GFRNONAA 103 09/20/2015 0745   GFRAA 119 09/20/2015 0745    Lipid Panel     Component Value Date/Time   CHOL 130 09/20/2015 0745   TRIG 84 09/20/2015 0745   HDL 50 09/20/2015 0745   CHOLHDL 2.6 09/20/2015 0745   LDLCALC 63 09/20/2015 0745    CBC    Component Value Date/Time   WBC 7.0 09/20/2015 0745   WBC 7.7 07/06/2011 1147   RBC 4.23 09/20/2015 0745   RBC 3.83 (L) 07/06/2011 1147   HGB 12.0 07/06/2011 1147   HCT 38.5 09/20/2015 0745   PLT 233 09/20/2015 0745   MCV 91 09/20/2015 0745   MCH 30.3 09/20/2015 0745   MCH 31.3 07/06/2011 1147   MCHC 33.2 09/20/2015 0745   MCHC 34.5 07/06/2011 1147   RDW 14.1 09/20/2015 0745   LYMPHSABS 1.2 09/20/2015 0745   MONOABS 0.7 07/06/2011 1147   EOSABS 0.2 09/20/2015 0745   BASOSABS 0.0 09/20/2015 0745    Hgb A1C Lab Results  Component Value Date   HGBA1C 6.0 (H) 09/20/2015     Assessment and Plan:  Make an appt for your annual exam

## 2016-10-08 NOTE — Assessment & Plan Note (Signed)
She will continue Carvedilol and Coumadin Continue monthly INR checks She will continue to follow with Dr. Herbie BaltimoreHarding

## 2016-10-08 NOTE — Assessment & Plan Note (Signed)
Controlled on Amlodipine

## 2016-10-08 NOTE — Patient Instructions (Signed)

## 2016-10-08 NOTE — Assessment & Plan Note (Signed)
She has her lipids checked annually by Dr. Herbie BaltimoreHarding Encouraged her to consume a low fat diet Continue Lipitor

## 2016-10-10 ENCOUNTER — Other Ambulatory Visit: Payer: Self-pay | Admitting: Internal Medicine

## 2016-10-10 DIAGNOSIS — Z1231 Encounter for screening mammogram for malignant neoplasm of breast: Secondary | ICD-10-CM

## 2016-10-15 ENCOUNTER — Encounter: Payer: Self-pay | Admitting: Radiology

## 2016-10-15 ENCOUNTER — Ambulatory Visit
Admission: RE | Admit: 2016-10-15 | Discharge: 2016-10-15 | Disposition: A | Discharge status: Home / Self Care | Payer: PRIVATE HEALTH INSURANCE | Source: Ambulatory Visit | Attending: Internal Medicine | Admitting: Internal Medicine

## 2016-10-15 DIAGNOSIS — Z1231 Encounter for screening mammogram for malignant neoplasm of breast: Secondary | ICD-10-CM

## 2016-10-19 ENCOUNTER — Other Ambulatory Visit: Payer: Self-pay | Admitting: Family Medicine

## 2016-10-19 LAB — PROTIME-INR: INR: 2.2 — AB (ref 0.9–1.1)

## 2016-10-20 LAB — PROTIME-INR
INR: 2.2 — ABNORMAL HIGH (ref 0.8–1.2)
PROTHROMBIN TIME: 22.3 s — AB (ref 9.1–12.0)

## 2016-10-22 ENCOUNTER — Ambulatory Visit (INDEPENDENT_AMBULATORY_CARE_PROVIDER_SITE_OTHER): Payer: PRIVATE HEALTH INSURANCE | Admitting: Pharmacist Clinician (PhC)/ Clinical Pharmacy Specialist

## 2016-10-22 DIAGNOSIS — Z7901 Long term (current) use of anticoagulants: Secondary | ICD-10-CM

## 2016-10-22 DIAGNOSIS — Z952 Presence of prosthetic heart valve: Secondary | ICD-10-CM

## 2016-11-09 ENCOUNTER — Other Ambulatory Visit: Payer: Self-pay | Admitting: Family Medicine

## 2016-11-09 LAB — POCT INR: INR: 2.1

## 2016-11-10 LAB — PROTIME-INR
INR: 2.1 — AB (ref 0.8–1.2)
PROTHROMBIN TIME: 21.3 s — AB (ref 9.1–12.0)

## 2016-11-12 ENCOUNTER — Ambulatory Visit (INDEPENDENT_AMBULATORY_CARE_PROVIDER_SITE_OTHER): Payer: PRIVATE HEALTH INSURANCE | Admitting: Pharmacist

## 2016-11-12 DIAGNOSIS — Z952 Presence of prosthetic heart valve: Secondary | ICD-10-CM

## 2016-11-12 DIAGNOSIS — Z7901 Long term (current) use of anticoagulants: Secondary | ICD-10-CM

## 2016-11-26 ENCOUNTER — Ambulatory Visit: Payer: Self-pay | Admitting: *Deleted

## 2016-11-26 ENCOUNTER — Other Ambulatory Visit: Payer: Self-pay | Admitting: Family Medicine

## 2016-11-26 DIAGNOSIS — Z952 Presence of prosthetic heart valve: Secondary | ICD-10-CM

## 2016-11-26 DIAGNOSIS — Z7901 Long term (current) use of anticoagulants: Secondary | ICD-10-CM

## 2016-11-26 LAB — PROTIME-INR: INR: 2.8 — AB (ref <=1.1)

## 2016-11-26 NOTE — Progress Notes (Signed)
PT/ INR drawn at [facility] health clinic. Out to [facility]. Will update chart once results received. BP WNL. Pt had no questions/concerns.

## 2016-11-27 ENCOUNTER — Encounter: Payer: Self-pay | Admitting: Internal Medicine

## 2016-11-27 LAB — PROTIME-INR
INR: 2.8 — AB (ref 0.8–1.2)
PROTHROMBIN TIME: 27.6 s — AB (ref 9.1–12.0)

## 2016-11-28 ENCOUNTER — Ambulatory Visit (INDEPENDENT_AMBULATORY_CARE_PROVIDER_SITE_OTHER): Payer: PRIVATE HEALTH INSURANCE | Admitting: Pharmacist Clinician (PhC)/ Clinical Pharmacy Specialist

## 2016-11-28 DIAGNOSIS — Z952 Presence of prosthetic heart valve: Secondary | ICD-10-CM

## 2016-11-28 DIAGNOSIS — Z7901 Long term (current) use of anticoagulants: Secondary | ICD-10-CM

## 2016-12-03 ENCOUNTER — Other Ambulatory Visit: Payer: Self-pay | Admitting: Cardiology

## 2016-12-05 ENCOUNTER — Other Ambulatory Visit: Payer: Self-pay | Admitting: Cardiology

## 2016-12-05 NOTE — Telephone Encounter (Signed)
Rx(s) sent to pharmacy electronically.  

## 2016-12-09 ENCOUNTER — Other Ambulatory Visit: Payer: Self-pay | Admitting: Cardiology

## 2016-12-28 ENCOUNTER — Ambulatory Visit: Payer: Self-pay | Admitting: *Deleted

## 2016-12-28 VITALS — BP 112/62

## 2016-12-28 DIAGNOSIS — Z952 Presence of prosthetic heart valve: Secondary | ICD-10-CM

## 2016-12-28 DIAGNOSIS — Z7901 Long term (current) use of anticoagulants: Secondary | ICD-10-CM

## 2016-12-28 NOTE — Progress Notes (Signed)
Monthly PT/INR drawn. BP check 112/62. [facility] upon receipt.

## 2016-12-29 LAB — PROTIME-INR
INR: 2.6 — ABNORMAL HIGH (ref 0.8–1.2)
PROTHROMBIN TIME: 25.5 s — AB (ref 9.1–12.0)

## 2016-12-31 NOTE — Progress Notes (Signed)
Encounter routed to [employer] [employer]bermaidChelley Truitt, [facility] CMA. Pt made aware of results. Office to contact pt if there are any concerns or medication changes.

## 2017-01-28 ENCOUNTER — Ambulatory Visit: Payer: Self-pay | Admitting: *Deleted

## 2017-01-28 ENCOUNTER — Other Ambulatory Visit: Payer: Self-pay | Admitting: Registered Nurse

## 2017-01-28 DIAGNOSIS — Z952 Presence of prosthetic heart valve: Secondary | ICD-10-CM

## 2017-01-28 DIAGNOSIS — Z7901 Long term (current) use of anticoagulants: Secondary | ICD-10-CM

## 2017-01-28 NOTE — Progress Notes (Signed)
Monthly BP check and Pt/INR draw in [facility] clinic. [facility] receipt.

## 2017-01-29 LAB — PROTIME-INR
AVAILABLE: 24.4
INR: 2.4 — AB (ref 0.9–1.1)

## 2017-01-30 ENCOUNTER — Ambulatory Visit (INDEPENDENT_AMBULATORY_CARE_PROVIDER_SITE_OTHER): Payer: PRIVATE HEALTH INSURANCE | Admitting: Pharmacist

## 2017-01-30 DIAGNOSIS — Z7901 Long term (current) use of anticoagulants: Secondary | ICD-10-CM

## 2017-01-30 DIAGNOSIS — Z952 Presence of prosthetic heart valve: Secondary | ICD-10-CM

## 2017-01-30 LAB — PROTIME-INR
INR: 2.4 — ABNORMAL HIGH (ref 0.8–1.2)
PROTHROMBIN TIME: 24.4 s — AB (ref 9.1–12.0)

## 2017-02-04 ENCOUNTER — Telehealth: Payer: Self-pay | Admitting: *Deleted

## 2017-02-04 NOTE — Telephone Encounter (Signed)
Non-therapeutic INR after lab draw last week. Not[facility] message was left for patient with medication dose changes. Called pt at work to confirm she had received these new instructions. She was not aware.  Pt verbally given new instructions of 10mg  Coumadin MWF, 7.5mg  other days. She acknowledges this. Instructions also sent via email for confirmation in print. Pt has no other questions. [facility]our[facility] to [facility] with any concerns.

## 2017-02-19 ENCOUNTER — Ambulatory Visit: Payer: Self-pay | Admitting: Registered Nurse

## 2017-02-19 VITALS — BP 132/70 | HR 61 | Temp 98.0°F

## 2017-02-19 DIAGNOSIS — K122 Cellulitis and abscess of mouth: Secondary | ICD-10-CM

## 2017-02-19 DIAGNOSIS — K130 Diseases of lips: Secondary | ICD-10-CM

## 2017-02-19 NOTE — Progress Notes (Signed)
Pt reports small area of discoloration and dryness just above upper lip x6 months. Esthetician recommended she have it checked out by dermatology.

## 2017-02-19 NOTE — Patient Instructions (Addendum)
Avoid licking lips/biting lips Apply generic topical triple antibiotic daily to affected area Avoid picking at scabs around mouth Wash with soap and water daily (sensitive skin formula no perfumes) Keflex 500mg  by mouth twice a day for 7 days Wear protective clothing to avoid sunburns Schedule appt with dermatology as history of frequent sunburns fair skin for full body exam; recommended skin biopsy if current plan does not have result of complete healing of lip lesions Discussed ABCDEs of skin cancer with patient

## 2017-02-19 NOTE — Progress Notes (Signed)
Subjective:    Patient ID: Victoria [name], female    DOB: 1961/07/11, 56 y.o.   MRN: [id number]  Married 55y/o caucasian female with history of too many sunburns to count, fair skin.  Noted upper lip/distal to nares nonhealing/scabbed area.  Has facials performed.  Puppy in house and children.  Denied cold sores.  Frequent chapped lips.  Has tried OTC neosporin without any resolution of rash.  Denied fever/chills, purulent drainage, facial swelling, pain, dysphagia, trauma, itching, honey crusted around affected areas, difficulty speaking, voice changes.      Review of Systems  Constitutional: Negative for activity change, appetite change, chills, diaphoresis, fatigue, fever and unexpected weight change.  HENT: Positive for mouth sores. Negative for congestion, dental problem, drooling, ear discharge, ear pain, facial swelling, hearing loss, nosebleeds, postnasal drip, rhinorrhea, sinus pain, sinus pressure, sneezing, sore throat, tinnitus, trouble swallowing and voice change.   Eyes: Negative for photophobia, pain, discharge, redness, itching and visual disturbance.  Respiratory: Negative for cough, choking, chest tightness, shortness of breath, wheezing and stridor.   Cardiovascular: Negative for chest pain, palpitations and leg swelling.  Gastrointestinal: Negative for abdominal distention, abdominal pain, blood in stool, constipation, diarrhea, nausea and vomiting.  Endocrine: Negative for cold intolerance and heat intolerance.  Genitourinary: Negative for difficulty urinating, dysuria and hematuria.  Musculoskeletal: Negative for arthralgias, back pain, gait problem, joint swelling, myalgias, neck pain and neck stiffness.  Skin: Positive for color change and rash. Negative for pallor and wound.  Allergic/Immunologic: Negative for environmental allergies and food allergies.  Neurological: Negative for dizziness, tremors, seizures, syncope, facial asymmetry, speech difficulty, weakness,  light-headedness, numbness and headaches.  Hematological: Negative for adenopathy. Does not bruise/bleed easily.  Psychiatric/Behavioral: Negative for agitation, behavioral problems, confusion and sleep disturbance.       Objective:   Physical Exam  Constitutional: She is oriented to person, place, and time. Vital signs are normal. She appears well-developed and well-nourished. She is active and cooperative.  Non-toxic appearance. She does not have a sickly appearance. She does not appear ill. No distress.  HENT:  Head: Normocephalic and atraumatic.  Right Ear: Hearing and external ear normal.  Left Ear: Hearing and external ear normal.  Nose: No mucosal edema, rhinorrhea, nose lacerations, sinus tenderness, nasal deformity, septal deviation or nasal septal hematoma. No epistaxis.  No foreign bodies. Right sinus exhibits no maxillary sinus tenderness and no frontal sinus tenderness. Left sinus exhibits no maxillary sinus tenderness and no frontal sinus tenderness.  Mouth/Throat: Uvula is midline and mucous membranes are normal. Mucous membranes are not pale, not dry and not cyanotic. She does not have dentures. Oral lesions present. No trismus in the jaw. Normal dentition. No dental abscesses, uvula swelling, lacerations or dental caries. No oropharyngeal exudate, posterior oropharyngeal edema, posterior oropharyngeal erythema or tonsillar abscesses.    Scabbed brown hyperpigmented upper lip border extending 0.5cm above and below lip vermillion not TTP slight honey crusting noted and macular erythema dry  Eyes: Conjunctivae, EOM and lids are normal. Pupils are equal, round, and reactive to light. Right eye exhibits no chemosis, no discharge, no exudate and no hordeolum. No foreign body present in the right eye. Left eye exhibits no chemosis, no discharge, no exudate and no hordeolum. No foreign body present in the left eye. Right conjunctiva is not injected. Right conjunctiva has no hemorrhage.  Left conjunctiva is not injected. Left conjunctiva has no hemorrhage. No scleral icterus. Right eye exhibits normal extraocular motion and no nystagmus. Left eye exhibits  normal extraocular motion and no nystagmus. Right pupil is round and reactive. Left pupil is round and reactive. Pupils are equal.  Neck: Trachea normal and normal range of motion. Neck supple. No tracheal tenderness, no spinous process tenderness and no muscular tenderness present. No neck rigidity. No tracheal deviation, no edema, no erythema and normal range of motion present. No thyroid mass and no thyromegaly present.  Cardiovascular: Normal rate, regular rhythm, S1 normal, S2 normal, normal heart sounds and intact distal pulses.  PMI is not displaced.  Exam reveals no gallop and no friction rub.   No murmur heard. Pulmonary/Chest: Effort normal and breath sounds normal. No accessory muscle usage or stridor. No respiratory distress. She has no decreased breath sounds. She has no wheezes. She has no rhonchi. She has no rales. She exhibits no tenderness.  Abdominal: Soft. She exhibits no distension.  Musculoskeletal: Normal range of motion. She exhibits no edema or tenderness.       Right shoulder: Normal.       Left shoulder: Normal.       Right hip: Normal.       Left hip: Normal.       Right knee: Normal.       Left knee: Normal.       Cervical back: Normal.       Right hand: Normal.       Left hand: Normal.  Lymphadenopathy:       Head (right side): No submental, no submandibular, no tonsillar, no preauricular, no posterior auricular and no occipital adenopathy present.       Head (left side): No submental, no submandibular, no tonsillar, no preauricular, no posterior auricular and no occipital adenopathy present.    She has no cervical adenopathy.       Right cervical: No superficial cervical, no deep cervical and no posterior cervical adenopathy present.      Left cervical: No superficial cervical, no deep cervical and  no posterior cervical adenopathy present.  Neurological: She is alert and oriented to person, place, and time. She has normal strength. She is not disoriented. She displays no atrophy and no tremor. No cranial nerve deficit or sensory deficit. She exhibits normal muscle tone. She displays no seizure activity. Coordination and gait normal. GCS eye subscore is 4. GCS verbal subscore is 5. GCS motor subscore is 6.  Skin: Skin is warm, dry and intact. Lesion and rash noted. No abrasion, no bruising, no burn, no ecchymosis, no laceration, no petechiae and no purpura noted. Rash is macular, papular and maculopapular. Rash is not nodular, not pustular, not vesicular and not urticarial. She is not diaphoretic. There is erythema. No cyanosis. No pallor. Nails show no clubbing.  Grouped 1cm x 0.5cm upper central lip and satellite lesion below right nare less than 0.5cm diamter hyperpigmented/scabbed brown  Psychiatric: She has a normal mood and affect. Her speech is normal and behavior is normal. Judgment and thought content normal. Cognition and memory are normal.  Nursing note and vitals reviewed.         Assessment & Plan:  A-cellulitis upper lip and lip lesion P-topical triple antibiotic BID x 7 days given UD from clinic stock x 4; keflex 500mg  po BID x 7 days #30 RF0 dispensed from [facility] see paper chart.  Schedule appt with dermatology for full body evaluation.  Avoid picking/scratching/licking/biting upper lip  RTC if worsening erythema, pain, purulent discharge, fever.  Wash towels, washcloths, sheets in hot water with bleach every couple of  days until infection resolved.  Recommended skin biopsy if area does not heal since greater than 6 months duration and at increased risk skin cancer due to history sunburns/fair skin.  Consult placed for patient.  May need to follow up with PCM.   Discussed Asymmetrical, Border irregular, Changing color, Diameter enlarging especially greater than 1cm,  Excoriation/Evolving lesion when performing self-skin exams.  Continue self-skin exams on regular basis, wear protective clothing and sunscreen at least 30 SPF.  Patient verbalized understanding, agreed with plan of care and had no further questions at this time.

## 2017-02-20 ENCOUNTER — Encounter: Payer: Self-pay | Admitting: Cardiology

## 2017-02-20 ENCOUNTER — Ambulatory Visit (INDEPENDENT_AMBULATORY_CARE_PROVIDER_SITE_OTHER): Payer: PRIVATE HEALTH INSURANCE | Admitting: Cardiology

## 2017-02-20 VITALS — BP 122/82 | HR 58 | Ht 60.0 in | Wt 120.6 lb

## 2017-02-20 DIAGNOSIS — I10 Essential (primary) hypertension: Secondary | ICD-10-CM

## 2017-02-20 DIAGNOSIS — E785 Hyperlipidemia, unspecified: Secondary | ICD-10-CM | POA: Diagnosis not present

## 2017-02-20 DIAGNOSIS — Z7901 Long term (current) use of anticoagulants: Secondary | ICD-10-CM | POA: Diagnosis not present

## 2017-02-20 DIAGNOSIS — Z952 Presence of prosthetic heart valve: Secondary | ICD-10-CM

## 2017-02-20 MED ORDER — ATORVASTATIN CALCIUM 40 MG PO TABS: 40.0000 mg | ORAL_TABLET | Freq: Every day | ORAL | 3 refills | Status: DC

## 2017-02-20 MED ORDER — WARFARIN SODIUM 5 MG PO TABS: ORAL_TABLET | ORAL | 0 refills | Status: DC

## 2017-02-20 NOTE — Assessment & Plan Note (Signed)
Excellent control with amlodipine and carvedilol

## 2017-02-20 NOTE — Patient Instructions (Signed)
NO CHANGE WITH MEDICATIONS    SCHEDULE [address]  [address]  - [place] - FEB 2019 Your physician has requested that you have an echocardiogram. Echocardiography is a painless test that uses sound waves to create images of your heart. It provides your doctor with information about the size and shape of your heart and how well your heart's chambers and valves are working. This procedure takes approximately one hour. There are no restrictions for this procedure.   Your physician wants you to follow-up in 12 MONTHS WITH DR HARDING. You will receive a reminder letter in the mail two months in advance. If you don't receive a letter, please call our office to schedule the follow-up appointment.  If you need a refill on your cardiac medications before your next appointment, please call your pharmacy.

## 2017-02-20 NOTE — Assessment & Plan Note (Signed)
Clearly auscultated will mechanical valve with associated murmur. Appears to functioning well. There is baseline gradient based on size differential. Per prior documentation, I would plan to recheck an echocardiogram prior to her annual follow-up. Continue warfarin for anticoagulation. No bleeding issues. Continue low-dose beta blocker.

## 2017-02-20 NOTE — Assessment & Plan Note (Signed)
She gets her labs checked actually through work. She is on atorvastatin. The [facility] active work-related clinic checks these labs in the goes into her insurance coverage. Unfortunately don't have the labs

## 2017-02-20 NOTE — Progress Notes (Signed)
PCP: Nicki Reaper, NP  Clinic Note: Chief Complaint  Patient presents with  . Follow-up    HPI: Victoria [name] is a 56 y.o. female with a PMH below who presents today for annual followup of bicuspid aortic valve with severe stenosis status post AVR/Bentall (St. Jude 21 mm Mechanical Valve) with Left Carotid Bypass in January 2011.He is anticoagulated with warfarin. Her initial postoperative echocardiogram showed mild to moderate stenosis with elevated gradients as noted below. This was thought to be potentially related to size mismatch of the graft.. Former Dr. Clarene Duke patient.Victoria [name] was last seen In February 2017. Doing well.  Recent Hospitalizations: None  Studies Reviewed: None  Interval History: Consepcion is still doing well with no major complaints. She has not had any symptoms of chest tightness pressure or dyspnea with rest or exertion. Remains active but without routine exercise. With the carvedilol on board, she is not really noticing the palpitations as much anymore.  No PND, orthopnea or edema. No lightheadedness, dizziness, weakness or syncope/near syncope. No TIA/amaurosis fugax symptoms. No melena, hematochezia, hematuria, or epstaxis. No claudication.  ROS: A comprehensive was performed. Review of Systems  Constitutional: Negative for chills, fever and malaise/fatigue.  HENT: Negative for congestion and hearing loss.   Respiratory: Negative for cough.   Cardiovascular:       Neg per HPI  Gastrointestinal: Negative for blood in stool and melena.  Genitourinary: Negative for hematuria.  Neurological: Negative for dizziness and focal weakness.  Endo/Heme/Allergies: Negative for environmental allergies. Bruises/bleeds easily.  Psychiatric/Behavioral: Negative for depression and memory loss. The patient is not nervous/anxious and does not have insomnia.   All other systems reviewed and are negative.   Past Medical History:  Diagnosis Date  . History  of bicuspid aortic valve     with severe AS and dilated aortic root/arch.  . Hyperlipidemia   . Hypertension   . 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 Jude 21 mm mechanical valve, 24 mm IMA shield vascular graft for the ascending aorta/arch), L carotid artery bypassing    Past Surgical History:  Procedure Laterality Date  . ABDOMINAL HYSTERECTOMY  2000   total  . AORTIC VALVE REPLACEMENT  January 2011   St. Jude 21mm mechanical valve, Bentall procesure & L cartoid artery bypassing   . BENTALL PROCEDURE  January 2011   See above  . CARDIAC CATHETERIZATION  December 2010   No significant coronary disease. Normal LV function.  . TRANSTHORACIC ECHOCARDIOGRAM  April 2014   Normal LV size and thickness. EF 65-70%.; Grade 3 diastolic dysfunction with elevated LAP; 21 mm tilting-disc St. Jude valve functioning normally. No pannus. Mild-moderate stenosis with velocities higher than anticipated for valve type. Peak gradient 35-39 mmHg, mean 20-24 mmHg.  Marland Kitchen TRANSTHORACIC ECHOCARDIOGRAM  January 2016   Normal EF (60-65%), Normal wall motion. 21 mm St Jude Mech AoV Prosthesis- well seated, mild AI (not peri-valvular leak as originally noted). Peak-Mean Gradients: 35 mmHg & 22 mHg (likely size related).  Stable    Current Meds  Medication Sig  . amLODipine (NORVASC) 10 MG tablet TAKE 1 TABLET BY MOUTH ONCE DAILY  . atorvastatin (LIPITOR) 40 MG tablet Take 1 tablet (40 mg total) by mouth at bedtime.  . carvedilol (COREG) 3.125 MG tablet TAKE 1 TABLET BY MOUTH EVERY 12 HOURS  . warfarin (COUMADIN) 5 MG tablet TAKE 1 1/2 TO 2 TABLETS BY MOUTH ONCE DAILY PER COUMADIN CLINIC  . [DISCONTINUED] atorvastatin (LIPITOR) 40  MG tablet TAKE 1 TABLET BY MOUTH AT BEDTIME  . [DISCONTINUED] warfarin (COUMADIN) 5 MG tablet TAKE 1 1/2 TO 2 TABLETS BY MOUTH ONCE DAILY PER COUMADIN CLINIC    Allergies  Allergen Reactions  . Lisinopril     Social History   Social History  . Marital status: Divorced     Spouse name: N/A  . Number of children: 1  . Years of education: N/A   Occupational History  .  [employer]   Social History Main Topics  . Smoking status: Former Smoker    Types: Cigarettes    Quit date: 01/01/2010  . Smokeless tobacco: Never Used  . Alcohol use 18.0 oz/week    12 Standard drinks or equivalent, 18 Cans of beer per week  . Drug use: No  . Sexual activity: Yes    Birth control/ protection: Surgical   Other Topics Concern  . None   Social History Narrative   She is a divorced mother of one, grandmother 1. She usually exercises 4-5 days a week. Very active.   Quit smoking in 2011.   Takes 1-2 beers per day and is stable.    family history includes Breast cancer in her maternal grandmother; Hypertension in her father; Lung cancer in her maternal grandmother; Stroke in her father.  Wt Readings from Last 3 Encounters:  02/20/17 54.7 kg (120 lb 9.6 oz)  10/08/16 53.3 kg (117 lb 8 oz)  02/16/16 52.4 kg (115 lb 8 oz)    PHYSICAL EXAM BP 122/82   Pulse (!) 58   Ht 5' (1.524 m)   Wt 54.7 kg (120 lb 9.6 oz)   BMI 23.55 kg/m  General appearance: alert, cooperative, appears stated age and no distress Neck: no adenopathy, no carotid bruit, no JVD, supple, symmetrical, trachea midline, thyroid not enlarged, symmetric, no tenderness/mass/nodules and Radiated aortic murmur to carotid; normal carotid upstroke Lungs: clear to auscultation bilaterally, normal percussion bilaterally and Nonlabored, good air movement. Heart: normal apical impulse, regular rate and rhythm, S1: normal, S2: normal prosthetic S2 and 3/6 SEM at the RUSB radiating to carotids. -- Can hear the mechanical valve without stethoscope Abdomen: soft, non-tender; bowel sounds normal; no masses, no organomegaly Extremities: extremities normal, atraumatic, no cyanosis or edema and no ulcers, gangrene or trophic changes Pulses: 2+ and symmetric Neurologic: Grossly normal   Adult ECG Report  Rate: 58  ;  Rhythm: sinus bradycardia and Otherwise normal axis, intervals and durations.;   Narrative Interpretation: Stable EKG   Other studies Reviewed: Additional studies/ records that were reviewed today include:  Recent Labs:  Labs checked through work - [place].     ASSESSMENT / PLAN: Problem List Items Addressed This Visit    Dyslipidemia, goal LDL below 130 (Chronic)    She gets her labs checked actually through work. She is on atorvastatin. The [facility] active work-related clinic checks these labs in the goes into her insurance coverage. Unfortunately don't have the labs      Relevant Medications   atorvastatin (LIPITOR) 40 MG tablet   warfarin (COUMADIN) 5 MG tablet   Essential hypertension (Chronic)    Excellent control with amlodipine and carvedilol      Relevant Medications   atorvastatin (LIPITOR) 40 MG tablet   warfarin (COUMADIN) 5 MG tablet   Other Relevant Orders   ECHOCARDIOGRAM COMPLETE   H/O aortic valve replacement - Primary (Chronic)    Clearly auscultated will mechanical valve with associated murmur. Appears to functioning well. There is baseline  gradient based on size differential. Per prior documentation, I would plan to recheck an echocardiogram prior to her annual follow-up. Continue warfarin for anticoagulation. No bleeding issues. Continue low-dose beta blocker.      Relevant Orders   ECHOCARDIOGRAM COMPLETE   Long term current use of anticoagulant therapy (Chronic)    No bleeding issues. Followed in our warfarin clinic         Current medicines are reviewed at length with the patient today. (+/- concerns) None The following changes have been made: None  Patient Instructions  NO CHANGE WITH MEDICATIONS    SCHEDULE [address]  [address]  [place]- [place] - FEB 2019 Your physician has requested that you have an echocardiogram. Echocardiography is a painless test that uses sound waves to create images of your heart. It provides your doctor with  information about the size and shape of your heart and how well your heart's chambers and valves are working. This procedure takes approximately one hour. There are no restrictions for this procedure.   Your physician wants you to follow-up in 12 MONTHS WITH DR Kayceon Oki. You will receive a reminder letter in the mail two months in advance. If you don't receive a letter, please call our office to schedule the follow-up appointment.  If you need a refill on your cardiac medications before your next appointment, please call your pharmacy.      Studies Ordered:   Orders Placed This Encounter  Procedures  . ECHOCARDIOGRAM COMPLETE      Bryan Lemmaavid Bandon Sherwin, M.D., M.S. Interventional Cardiologist   Pager # [phone] Phone # [phone] [address] [place], [place] [address]

## 2017-02-20 NOTE — Assessment & Plan Note (Signed)
No bleeding issues. Followed in our warfarin clinic

## 2017-02-21 NOTE — Addendum Note (Signed)
Addended by: Tobin ChadMARTIN, Erik Nessel V. on: 02/21/2017 02:46 PM   Modules accepted: Orders

## 2017-02-25 ENCOUNTER — Ambulatory Visit: Payer: Self-pay | Admitting: *Deleted

## 2017-02-25 VITALS — BP 118/66

## 2017-02-25 DIAGNOSIS — Z5181 Encounter for therapeutic drug level monitoring: Secondary | ICD-10-CM

## 2017-02-25 DIAGNOSIS — I10 Essential (primary) hypertension: Secondary | ICD-10-CM

## 2017-02-25 NOTE — Progress Notes (Signed)
Pt in for monthly BP check and PT/INR draw. [facility]  Also discussed medication fills through clinic with pt. Rx's for Amlodipine 10mg  (written 12/05/16) and Atorvastatin 40mg  (written 02/20/17) printed from Epic, placed in paper chart. Will begin filling these this month in clinic. Warfarin and Coreg will still be obtained through external pharmacy as they are not available in clinic dispensary.

## 2017-02-26 ENCOUNTER — Ambulatory Visit (INDEPENDENT_AMBULATORY_CARE_PROVIDER_SITE_OTHER): Payer: PRIVATE HEALTH INSURANCE | Admitting: Pharmacist

## 2017-02-26 DIAGNOSIS — Z7901 Long term (current) use of anticoagulants: Secondary | ICD-10-CM

## 2017-02-26 DIAGNOSIS — Z952 Presence of prosthetic heart valve: Secondary | ICD-10-CM

## 2017-02-26 LAB — PROTIME-INR
INR: 3 — AB (ref 0.8–1.2)
Prothrombin Time: 29.6 s — ABNORMAL HIGH (ref 9.1–12.0)

## 2017-03-06 ENCOUNTER — Other Ambulatory Visit: Payer: Self-pay | Admitting: Cardiology

## 2017-03-14 ENCOUNTER — Other Ambulatory Visit: Payer: Self-pay | Admitting: Cardiology

## 2017-03-14 NOTE — Telephone Encounter (Signed)
Medication Detail    Disp Refills Start End   atorvastatin (LIPITOR) 40 MG tablet 90 tablet 3 02/20/2017    Sig - Route: Take 1 tablet (40 mg total) by mouth at bedtime. - Oral   E-Prescribing Status: Receipt confirmed by pharmacy (02/20/2017 9:44 AM EST)   Pharmacy   [id number] - [place], [place] - [address]

## 2017-03-26 ENCOUNTER — Telehealth: Payer: Self-pay | Admitting: Registered Nurse

## 2017-03-26 NOTE — Telephone Encounter (Signed)
Discussed with patient if lip lesion resolved.  It had improved but not resolved with keflex.  Discussed signs and symptoms of skin cancer with patient and recommended dermatology appt/evaluation/skin biopsy.  Patient verbalized understanding information/instructions, agreed with plan of care and had no further questions at this time.

## 2017-03-29 ENCOUNTER — Ambulatory Visit: Payer: Self-pay | Admitting: *Deleted

## 2017-03-29 VITALS — BP 120/68

## 2017-03-29 DIAGNOSIS — Z7901 Long term (current) use of anticoagulants: Secondary | ICD-10-CM

## 2017-03-29 DIAGNOSIS — Z952 Presence of prosthetic heart valve: Secondary | ICD-10-CM

## 2017-03-29 NOTE — Progress Notes (Signed)
PT/INR drawn for anti-coag monitoring. BP check as well. Was stung by a presumed bee 2 days ago. L medial wrist. Site with swelling and erythema, itching. Gave hydrocortisone cream for itching and redness. Suggested ice pack for swelling. Pt verbalizes understanding. Will route lab results to cardiology pharmacist upon receipt.

## 2017-03-30 LAB — PROTIME-INR
INR: 3.4 — ABNORMAL HIGH (ref 0.8–1.2)
Prothrombin Time: 33.2 s — ABNORMAL HIGH (ref 9.1–12.0)

## 2017-04-01 ENCOUNTER — Ambulatory Visit (INDEPENDENT_AMBULATORY_CARE_PROVIDER_SITE_OTHER): Payer: PRIVATE HEALTH INSURANCE | Admitting: Pharmacist

## 2017-04-01 DIAGNOSIS — Z7901 Long term (current) use of anticoagulants: Secondary | ICD-10-CM | POA: Diagnosis not present

## 2017-04-01 DIAGNOSIS — Z952 Presence of prosthetic heart valve: Secondary | ICD-10-CM

## 2017-04-02 NOTE — Progress Notes (Signed)
Pt made aware as well as cardiology pharmacist. They have contacted pt and informed her of updated medication regimen.

## 2017-04-09 ENCOUNTER — Ambulatory Visit: Payer: Self-pay | Admitting: Registered Nurse

## 2017-04-09 VITALS — BP 180/82 | HR 70 | Temp 98.3°F

## 2017-04-09 DIAGNOSIS — R030 Elevated blood-pressure reading, without diagnosis of hypertension: Secondary | ICD-10-CM

## 2017-04-09 DIAGNOSIS — S8002XA Contusion of left knee, initial encounter: Secondary | ICD-10-CM

## 2017-04-09 DIAGNOSIS — T148XXA Other injury of unspecified body region, initial encounter: Secondary | ICD-10-CM | POA: Insufficient documentation

## 2017-04-09 DIAGNOSIS — S86912A Strain of unspecified muscle(s) and tendon(s) at lower leg level, left leg, initial encounter: Secondary | ICD-10-CM | POA: Insufficient documentation

## 2017-04-09 MED ORDER — DICLOFENAC SODIUM 1 % TD GEL: 4.0000 g | Freq: Four times a day (QID) | TRANSDERMAL | 0 refills | Status: AC

## 2017-04-09 MED ORDER — ACETAMINOPHEN 500 MG PO TABS: 500.0000 mg | ORAL_TABLET | Freq: Four times a day (QID) | ORAL | 0 refills | Status: DC | PRN

## 2017-04-09 NOTE — Patient Instructions (Addendum)
Tylenol maximum dose 1 extra strength tablet  every 6 hours by mouth as needed for pain due to coumadin use Ice 15 minutes 4 times per day Elevate left leg Discussed ace wrap/cane use/heel slides/calf pumps; TED hose to prevent blood clots when immobile biofreeze apply every 6 hours to left knee voltaren gel 1% apply to left leg/knee every 6 hours as needed for pain Hydrate Avoid motrin/advil/aleve/naproxen/naprosyn/ibuprofen as these interact with coumadin/warfarin to increase blood levels/bleeding Follow up Thursday for re-evaluation Consider PT/orthopedics/re-imaging 10 days post injury  Contusion A contusion is a deep bruise. Contusions are the result of a blunt injury to tissues and muscle fibers under the skin. The injury causes bleeding under the skin. The skin overlying the contusion may turn blue, purple, or yellow. Minor injuries will give you a painless contusion, but more severe contusions may stay painful and swollen for a few weeks. What are the causes? This condition is usually caused by a blow, trauma, or direct force to an area of the body. What are the signs or symptoms? Symptoms of this condition include:  Swelling of the injured area.  Pain and tenderness in the injured area.  Discoloration. The area may have redness and then turn blue, purple, or yellow. How is this diagnosed? This condition is diagnosed based on a physical exam and medical history. An X-ray, CT scan, or MRI may be needed to determine if there are any associated injuries, such as broken bones (fractures). How is this treated? Specific treatment for this condition depends on what area of the body was injured. In general, the best treatment for a contusion is resting, icing, applying pressure to (compression), and elevating the injured area. This is often called the RICE strategy. Over-the-counter anti-inflammatory medicines may also be recommended for pain control. Follow these instructions at  home:  Rest the injured area.  If directed, apply ice to the injured area:  Put ice in a plastic bag.  Place a towel between your skin and the bag.  Leave the ice on for 20 minutes, 2-3 times per day.  If directed, apply light compression to the injured area using an elastic bandage. Make sure the bandage is not wrapped too tightly. Remove and reapply the bandage as directed by your health care provider.  If possible, raise (elevate) the injured area above the level of your heart while you are sitting or lying down.  Take over-the-counter and prescription medicines only as told by your health care provider. Contact a health care provider if:  Your symptoms do not improve after several days of treatment.  Your symptoms get worse.  You have difficulty moving the injured area. Get help right away if:  You have severe pain.  You have numbness in a hand or foot.  Your hand or foot turns pale or cold. This information is not intended to replace advice given to you by your health care provider. Make sure you discuss any questions you have with your health care provider. Document Released: 09/19/2005 Document Revised: 04/19/2016 Document Reviewed: 04/27/2015 Elsevier Interactive Patient Education  2017 [employer]  Knee Sprain, Adult A knee sprain is a stretch or tear in a knee ligament. Knee ligaments are bands of tissue that connect bones in the knee to each other. What are the causes? This condition often results from:  A fall.  An injury to the knee. What are the signs or symptoms? Symptoms of this condition include:  Trouble bending the leg.  Swelling in the knee.  Bruising around the knee.  Tenderness or pain in the knee.  Muscle spasms around the knee. How is this diagnosed? This condition may be diagnosed based on:  A physical exam.  What happened just before you started to have symptoms.  Tests, including:  An X-ray. This may be done to make sure no  bones are broken.  An MRI. This may be done to check if the ligament is torn.  Stress testing of the knee. This may be done to check ligament damage. How is this treated? Treatment for this condition may involve:  Keeping the knee still (immobilized) with a cast, brace, or splint.  Applying ice to the knee. This helps with pain and swelling.  Keeping the knee raised (elevated) above the level of your heart when you are resting. This helps with pain and swelling.  Taking medicine for pain.  Exercises to prevent or limit permanent weakness or stiffness in your knee.  Surgery to reconnect the ligament to the bone or to reconstruct it. This may be needed if the ligament tore all the way. Follow these instructions at home: If you have a splint or brace:   Wear the splint or brace as told by your health care provider. Remove it only as told by your health care provider.  Loosen the splint or brace if your toes tingle, become numb, or turn cold and blue.  Keep the splint or brace clean.  If the splint or brace is not waterproof:  Do not let it get wet.  Cover it with a watertight covering when you take a bath or a shower. If you have a cast:   Do not stick anything inside the cast to scratch your skin. Doing that increases your risk of infection.  Check the skin around the cast every day. Tell your health care provider about any concerns.  You may put lotion on dry skin around the edges of the cast. Do not put lotion on the skin underneath the cast.  Keep the cast clean.  If the cast is not waterproof:  Do not let it get wet.  Cover it with a watertight covering when you take a bath or a shower. Managing pain, stiffness, and swelling    If directed, put ice on the injured area.  If you have a removable splint or brace, remove it as told by your health care provider.  Put ice in a plastic bag.  Place a towel between your skin and the bag or between your cast and the  bag.  Leave the ice on for 20 minutes, 2-3 times a day.  Gently move your toes often to avoid stiffness and to lessen swelling.  Elevate the injured area above the level of your heart while you are sitting or lying down.  Take over-the-counter and prescription medicines only as told by your health care provider. General instructions   Do exercises as told by your health care provider.  Keep all follow-up visits as told by your health care provider. This is important. Contact a health care provider if:  You have pain that gets worse.  The cast, brace, or splint does not fit right.  The cast, brace, or splint gets damaged. Get help right away if:  You cannot use your injured joint to support any of your body weight (cannot bear weight).  You cannot move the injured joint.  You cannot walk more than a few steps without pain or without your knee buckling.  You have  significant pain, swelling, or numbness below the cast, brace, or splint. This information is not intended to replace advice given to you by your health care provider. Make sure you discuss any questions you have with your health care provider. Document Released: 12/10/2005 Document Revised: 08/29/2016 Document Reviewed: 06/29/2016 Elsevier Interactive Patient Education  2017 [employer].

## 2017-04-09 NOTE — Progress Notes (Signed)
Subjective:    Patient ID: Victoria [name], female    DOB: 15-Feb-1961, 56 y.o.   MRN: [id number]  55y/o caucasian female in monogamous relationship reported she slid in to 3rd base playing softball on Saturday (3 days PTA). Landed on anterior aspect of L knee. Swelling, warmth and bruising present. Bruising from top of knee down mid shin. Applying ice. Pulses present. Unable to bear weight. Was seen at [facility] on Sunday. Xray negative. On Coumadin. No wrap or brace placed at [facility]. Smallest crutches available in clinic are Ht of 5'2". Pt is 5'0".   Came to clinic in wheelchair today drove self to clinic reporting pain 8/10 uncomfortable driving/moving left leg.  Not worse but not better since 24 hours after injury has been resting at home with leg elevated taking motrin and tylenol alternating prn.      Review of Systems  Constitutional: Negative for activity change, appetite change, chills, diaphoresis, fatigue and fever.  HENT: Negative for congestion, ear pain and sore throat.   Eyes: Negative for pain and discharge.  Respiratory: Negative for cough and wheezing.   Cardiovascular: Negative for chest pain, palpitations and leg swelling.  Gastrointestinal: Negative for abdominal distention, abdominal pain, blood in stool, constipation, diarrhea, nausea and vomiting.  Endocrine: Negative for cold intolerance and heat intolerance.  Genitourinary: Negative for difficulty urinating, dysuria and hematuria.  Musculoskeletal: Positive for arthralgias, gait problem, joint swelling and myalgias. Negative for back pain, neck pain and neck stiffness.  Skin: Positive for color change and rash. Negative for pallor and wound.  Allergic/Immunologic: Negative for environmental allergies and food allergies.  Neurological: Negative for dizziness, tremors, seizures, syncope, facial asymmetry, speech difficulty, weakness, light-headedness, numbness and headaches.  Hematological: Negative for adenopathy. Does not  bruise/bleed easily.  Psychiatric/Behavioral: Positive for sleep disturbance. Negative for agitation and confusion. The patient is not nervous/anxious.        Objective:   Physical Exam  Constitutional: She is oriented to person, place, and time. She appears well-developed and well-nourished. She is active and cooperative.  Non-toxic appearance. She does not have a sickly appearance. She does not appear ill. No distress.  HENT:  Head: Normocephalic and atraumatic.  Right Ear: Hearing and external ear normal.  Left Ear: Hearing and external ear normal.  Nose: Nose normal. No mucosal edema, rhinorrhea, nose lacerations, sinus tenderness, nasal deformity, septal deviation or nasal septal hematoma. No epistaxis.  No foreign bodies.  Mouth/Throat: Uvula is midline and mucous membranes are normal. Mucous membranes are not pale, not dry and not cyanotic. She does not have dentures. No oral lesions. No trismus in the jaw. Normal dentition. No dental abscesses, uvula swelling, lacerations or dental caries. No oropharyngeal exudate, posterior oropharyngeal edema, posterior oropharyngeal erythema or tonsillar abscesses.  Eyes: Conjunctivae, EOM and lids are normal. Pupils are equal, round, and reactive to light. Right eye exhibits no chemosis, no discharge, no exudate and no hordeolum. No foreign body present in the right eye. Left eye exhibits no chemosis, no discharge, no exudate and no hordeolum. No foreign body present in the left eye. Right conjunctiva is not injected. Right conjunctiva has no hemorrhage. Left conjunctiva is not injected. Left conjunctiva has no hemorrhage. No scleral icterus. Right eye exhibits normal extraocular motion and no nystagmus. Left eye exhibits normal extraocular motion and no nystagmus. Right pupil is round and reactive. Left pupil is round and reactive. Pupils are equal.  Neck: Trachea normal and normal range of motion. Neck supple. No tracheal tenderness,  no spinous process  tenderness and no muscular tenderness present. No neck rigidity. No tracheal deviation, no edema, no erythema and normal range of motion present. No thyroid mass and no thyromegaly present.  Cardiovascular: Normal rate, regular rhythm, S1 normal, S2 normal, normal heart sounds and intact distal pulses.  PMI is not displaced.  Exam reveals no gallop and no friction rub.   No murmur heard. Pulses:      Dorsalis pedis pulses are 2+ on the right side, and 2+ on the left side.       Posterior tibial pulses are 2+ on the right side, and 2+ on the left side.  Pulmonary/Chest: Effort normal and breath sounds normal. No accessory muscle usage or stridor. No respiratory distress. She has no decreased breath sounds. She has no wheezes. She has no rhonchi. She has no rales. She exhibits no tenderness.  Abdominal: Soft. She exhibits no distension.  Musculoskeletal: She exhibits edema, tenderness and deformity.       Right shoulder: Normal.       Left shoulder: Normal.       Right hip: Normal.       Left hip: Normal.       Right knee: Normal.       Left knee: She exhibits decreased range of motion, swelling, effusion, ecchymosis, deformity, erythema and bony tenderness. She exhibits no laceration, normal alignment, no LCL laxity, normal patellar mobility and no MCL laxity. Tenderness found. Medial joint line and patellar tendon tenderness noted. No lateral joint line, no MCL and no LCL tenderness noted.       Right ankle: Normal.       Left ankle: Normal.       Cervical back: Normal.       Right hand: Normal.       Left hand: Normal.       Right upper leg: Normal.       Left upper leg: Normal.       Right lower leg: Normal.       Left lower leg: She exhibits tenderness. She exhibits no bony tenderness, no swelling, no edema, no deformity and no laceration.       Legs:      Right foot: Normal.       Left foot: Normal.  Negative anterior/posterior drawer sign/lachmans/valgus/varus laxity Limited ROM; +  effusion left knee 2+/4; ecchymosis/erythema and nodule patella; patellar tendons all ttp greatest superior/medial and distal; did not observe patient gait due to pain; did not perform mcmurray's test due to pain  Lymphadenopathy:       Head (right side): No submental, no submandibular, no tonsillar, no preauricular, no posterior auricular and no occipital adenopathy present.       Head (left side): No submental, no submandibular, no tonsillar, no preauricular, no posterior auricular and no occipital adenopathy present.    She has no cervical adenopathy.       Right cervical: No superficial cervical, no deep cervical and no posterior cervical adenopathy present.      Left cervical: No superficial cervical, no deep cervical and no posterior cervical adenopathy present.  Neurological: She is alert and oriented to person, place, and time. She has normal strength. She is not disoriented. She displays no atrophy and no tremor. No cranial nerve deficit or sensory deficit. She exhibits normal muscle tone. She displays no seizure activity. Gait abnormal. Coordination normal. GCS eye subscore is 4. GCS verbal subscore is 5. GCS motor subscore is 6.  Skin:  Skin is warm, dry and intact. Bruising, ecchymosis and rash noted. No abrasion, no burn, no laceration, no lesion, no petechiae and no purpura noted. Rash is macular. Rash is not papular, not maculopapular, not nodular, not pustular, not vesicular and not urticarial. She is not diaphoretic. There is erythema. No cyanosis. No pallor. Nails show no clubbing.  Psychiatric: She has a normal mood and affect. Her speech is normal and behavior is normal. Judgment and thought content normal. Cognition and memory are normal.  Nursing note and vitals reviewed.         Assessment & Plan:  A-left knee sprain and contusion left knee/shin  P-Patient was instructed to rest, ice and elevate leg.  Wrapped with ace bandage and given ice pack from clinic stock; Nance Pew RN demonstrated walking with cane and distributed/fitted from clinic stock.  Consider purchasing OTC crutches patient requires pediatric size not available in clinic.  Utilizing clinic wheelchair with coworker to get back to vehicle.  Discussed no more than 2g tylenol per 24 hours due to coumadin.  Avoid advil/aleve/motrin/ibuprofen/naproxen/naprosyn.   biofreeze alternate with voltaren gel 1% 4g topical left knee QID x 7 days #1 RF0 Rx given.  Speak with supervisor as limited duty recommended this week not lifting greater than 5 lbs.  Discussed with patient oral NSAIDs and topical can increase warfarin levels/bleeding.  Call or return to clinic as needed if these symptoms worsen or fail to improve as anticipated.  Patient verbalized agreement and understanding of treatment plan.   P2:  ROM exercises, Stretching, and Hand out given

## 2017-04-11 ENCOUNTER — Telehealth: Payer: Self-pay | Admitting: *Deleted

## 2017-04-11 ENCOUNTER — Encounter: Payer: Self-pay | Admitting: Internal Medicine

## 2017-04-11 ENCOUNTER — Ambulatory Visit (INDEPENDENT_AMBULATORY_CARE_PROVIDER_SITE_OTHER): Payer: PRIVATE HEALTH INSURANCE | Admitting: Internal Medicine

## 2017-04-11 VITALS — BP 124/70 | HR 78 | Temp 98.0°F | Wt 115.8 lb

## 2017-04-11 DIAGNOSIS — L089 Local infection of the skin and subcutaneous tissue, unspecified: Secondary | ICD-10-CM

## 2017-04-11 DIAGNOSIS — S8010XA Contusion of unspecified lower leg, initial encounter: Secondary | ICD-10-CM

## 2017-04-11 DIAGNOSIS — M7989 Other specified soft tissue disorders: Secondary | ICD-10-CM

## 2017-04-11 DIAGNOSIS — M79662 Pain in left lower leg: Secondary | ICD-10-CM

## 2017-04-11 MED ORDER — AMOXICILLIN-POT CLAVULANATE 875-125 MG PO TABS: 1.0000 | ORAL_TABLET | Freq: Two times a day (BID) | ORAL | 0 refills | Status: DC

## 2017-04-11 NOTE — Progress Notes (Signed)
Subjective:    Patient ID: [name], female    DOB: 1961/07/07, 56 y.o.   MRN: [id number]  HPI  Pt presents to the clinic today with c/o bruising, swelling and pain of her left lower leg. She reports she slid into 3rd base during a softball game 4/14. After that, she felt pain and had difficulty bearing weight. She went to employee health 04/09/17. Xray of the left leg did not show any acute fractures. She was advised to wrap the leg in an ACE wrap, elevate the leg and apply ice. She was given a cane to walk with. And an RX for Voltaren Gel and Biofreeze to use for pain. Since that visit, she reports the bruising has progressed. She has also started noticing increased warmth and redness just below the kneecap. She still has pain with weightbearing. She denies fever, chills or body aches. She is on Coumadin for a mechanical heart valve.  Review of Systems      Past Medical History:  Diagnosis Date  . History of bicuspid aortic valve     with severe AS and dilated aortic root/arch.  . Hyperlipidemia   . Hypertension   . 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 acetaminophen (TYLENOL) 500 MG tablet Take 1 tablet (500 mg total) by mouth every 6 (six) hours as needed. 30 tablet 0  . amLODipine (NORVASC) 10 MG tablet TAKE 1 TABLET BY MOUTH ONCE DAILY 90 tablet 3  . atorvastatin (LIPITOR) 40 MG tablet Take 1 tablet (40 mg total) by mouth at bedtime. 90 tablet 3  . carvedilol (COREG) 3.125 MG tablet TAKE 1 TABLET BY MOUTH EVERY 12 HOURS 180 tablet 2  . diclofenac sodium (VOLTAREN) 1 % GEL Apply 4 g topically 4 (four) times daily. 1 Tube 0  . warfarin (COUMADIN) 5 MG tablet TAKE 1 1/2 TO 2 TABLETS BY MOUTH ONCE DAILY PER COUMADIN CLINIC 180 tablet 0   No current facility-administered medications  for this visit.     Allergies  Allergen Reactions  . Lisinopril     Family History  Problem Relation Age of Onset  . Stroke Father   . Hypertension Father   . Breast cancer Maternal Grandmother   . Lung cancer Maternal Grandmother     Social History   Social History  . Marital status: Divorced    Spouse name: N/A  . Number of children: 1  . Years of education: N/A   Occupational History  .  [employer]   Social History Main Topics  . Smoking status: Former Smoker    Types: Cigarettes    Quit date: 01/01/2010  . Smokeless tobacco: Never Used  . Alcohol use 18.0 oz/week    12 Standard drinks or equivalent, 18 Cans of beer per week  . Drug use: No  . Sexual activity: Yes    Birth control/ protection: Surgical   Other Topics Concern  . Not on file   Social History Narrative   She is a divorced mother of one, grandmother 1. She usually exercises 4-5 days a week. Very active.   Quit smoking in 2011.   Takes 1-2 beers per day and is stable.     Constitutional: Denies fever, malaise, fatigue, headache or abrupt weight changes.  Musculoskeletal: Pt reports left leg  pain. Denies muscle pain.  Skin: Pt reports redness and warmth of left lower leg. Denies rashes, lesions or ulcercations.    No other specific complaints in a complete review of systems (except as listed in HPI above).  Objective:   Physical Exam   BP 124/70   Pulse 78   Temp 98 F (36.7 C) (Oral)   Wt 115 lb 12 oz (52.5 kg)   SpO2 98%   BMI 22.61 kg/m  Wt Readings from Last 3 Encounters:  04/11/17 115 lb 12 oz (52.5 kg)  02/20/17 120 lb 9.6 oz (54.7 kg)  10/08/16 117 lb 8 oz (53.3 kg)    General: Appears her stated age, in NAD. Skin: 2.5 cm area of redness and warmth noted just below the left patella. Bruising noted on entire left lower leg.  Musculoskeletal: Normal extension of the left knee. Pain with flexion. She is limping when she walks. Using a cane for assistance. Neurological:  Alert and oriented. Sensation intact to LLE.   BMET    Component Value Date/Time   NA 139 09/20/2015 0745   K 4.9 09/20/2015 0745   CL 102 09/20/2015 0745   CO2 27 07/06/2011 1147   GLUCOSE 91 09/20/2015 0745   GLUCOSE 100 (H) 07/06/2011 1147   BUN 14 09/20/2015 0745   CREATININE 0.63 09/20/2015 0745   CALCIUM 9.1 09/20/2015 0745   GFRNONAA 103 09/20/2015 0745   GFRAA 119 09/20/2015 0745    Lipid Panel     Component Value Date/Time   CHOL 130 09/20/2015 0745   TRIG 84 09/20/2015 0745   HDL 50 09/20/2015 0745   CHOLHDL 2.6 09/20/2015 0745   LDLCALC 63 09/20/2015 0745    CBC    Component Value Date/Time   WBC 7.0 09/20/2015 0745   WBC 7.7 07/06/2011 1147   RBC 4.23 09/20/2015 0745   RBC 3.83 (L) 07/06/2011 1147   HGB 12.0 07/06/2011 1147   HCT 38.5 09/20/2015 0745   PLT 233 09/20/2015 0745   MCV 91 09/20/2015 0745   MCH 30.3 09/20/2015 0745   MCH 31.3 07/06/2011 1147   MCHC 33.2 09/20/2015 0745   MCHC 34.5 07/06/2011 1147   RDW 14.1 09/20/2015 0745   LYMPHSABS 1.2 09/20/2015 0745   MONOABS 0.7 07/06/2011 1147   EOSABS 0.2 09/20/2015 0745   BASOSABS 0.0 09/20/2015 0745    Hgb A1C Lab Results  Component Value Date   HGBA1C 6.0 (H) 09/20/2015           Assessment & Plan:   Pain and Swelling of LLE:  She is concerned about tendon and ligament injury Advised her to swelling is too great right now to assess this She will continue RICE therapy for now Advised her to make an appt with Dr. Patsy Lager on 5/1 for further evaluation  Infected Hematoma of Left Leg:  eRx for Augmentin BID x 10 days Work note provided  Return precautions discussed Nicki Reaper, NP

## 2017-04-11 NOTE — Patient Instructions (Signed)
RICE for Routine Care of Injuries Many injuries can be cared for using rest, ice, compression, and elevation (RICE therapy). Using RICE therapy can help to lessen pain and swelling. It can help your body to heal. Rest Reduce your normal activities and avoid using the injured part of your body. You can go back to your normal activities when you feel okay and your doctor says it is okay. Ice Do not put ice on your bare skin.  Put ice in a plastic bag.  Place a towel between your skin and the bag.  Leave the ice on for 20 minutes, 2-3 times a day.  Do this for as long as told by your doctor. Compression Compression means putting pressure on the injured area. This can be done with an elastic bandage. If an elastic bandage has been applied:  Remove and reapply the bandage every 3-4 hours or as told by your doctor.  Make sure the bandage is not wrapped too tight. Wrap the bandage more loosely if part of your body beyond the bandage is blue, swollen, cold, painful, or loses feeling (numb).  See your doctor if the bandage seems to make your problems worse.  Elevation Elevation means keeping the injured area raised. Raise the injured area above your heart or the center of your chest if you can. When should I get help? You should get help if:  You keep having pain and swelling.  Your symptoms get worse.  Get help right away if: You should get help right away if:  You have sudden bad pain at or below the area of your injury.  You have redness or more swelling around your injury.  You have tingling or numbness at or below the injury that does not go away when you take off the bandage.  This information is not intended to replace advice given to you by your health care provider. Make sure you discuss any questions you have with your health care provider. Document Released: 05/28/2008 Document Revised: 11/06/2016 Document Reviewed: 11/17/2014 Elsevier Interactive Patient Education  2017  [employer]  

## 2017-04-11 NOTE — Telephone Encounter (Signed)
Pt seen in [facility] on Tuesday for knee injury that occurred outside of work. Was placed on light duty with re-eval scheduled in clinic for today. Pt presented to work, still unable to bear weight on L leg. Brought to clinic with HR in wheelchair, using cane that was provided to support leg in a straight position as she cannot bend leg at the knee 2/2 pain. Bruising present and has extended further distally down shin to just above L ankle. Swelling in lower leg slightly improved. Knee still warm to touch and TTP. Ace bandage in place. Did not remove to visualize knee for pt's comfort as she is crying from pain. Appt made at pt's PCP office as HR reports she needs FMLA paperwork completed as she cannot perform her job duties in her current state. Sports Medicine MD unavailable due to scheduling today, pt will see PCP NP today at 10am. [facility] appt cancelled.

## 2017-04-12 ENCOUNTER — Telehealth: Payer: Self-pay

## 2017-04-12 ENCOUNTER — Other Ambulatory Visit: Payer: Self-pay | Admitting: Internal Medicine

## 2017-04-12 ENCOUNTER — Telehealth: Payer: Self-pay | Admitting: Internal Medicine

## 2017-04-12 MED ORDER — HYDROCODONE-ACETAMINOPHEN 5-325 MG PO TABS: 1.0000 | ORAL_TABLET | Freq: Three times a day (TID) | ORAL | 0 refills | Status: DC | PRN

## 2017-04-12 NOTE — Telephone Encounter (Signed)
rx up front ready for pickup. 

## 2017-04-12 NOTE — Telephone Encounter (Signed)
RX printed and signed and given to [facility]

## 2017-04-12 NOTE — Telephone Encounter (Signed)
She said that would be great. She will send Victoria [name] to come get it before 5.

## 2017-04-12 NOTE — Telephone Encounter (Signed)
Done, given to robin 

## 2017-04-12 NOTE — Telephone Encounter (Signed)
I can give her a few Norco but she would have to come pick up RX, I can't send it in. Can she come pick it up before 5?

## 2017-04-12 NOTE — Telephone Encounter (Signed)
[facility] faxed attending physician statement In [facility] IN BOX

## 2017-04-12 NOTE — Telephone Encounter (Signed)
Pt was seen 04/11/17 and today the lt knee is continuously hurting at pain level 10; hurts whether sitting and not moving. Pt request med for pain.[facility] [address]. Pt request cb ASAP.

## 2017-04-15 ENCOUNTER — Telehealth: Payer: Self-pay | Admitting: *Deleted

## 2017-04-15 NOTE — Telephone Encounter (Signed)
Called pt to check in after 24 hours of abx and prednisone for traumatic hematoma that occurred 6 days prior. Pt reports pain is 10/10 despite meds. Worse than day prior. Unable to ambulate without severe pain. Only goes to bathroom and back to couch with assistance. Pt tearful and upset on phone. PCP placed pt out of work until at least 5/1. Has appt with sports med MD 4/30. RN suggested pt call PCP office and inform them of uncontrolled pain to see if they can suggest any other pain control options. Pt agreeable to this. Will check back in with pt next week.

## 2017-04-16 ENCOUNTER — Other Ambulatory Visit: Payer: Self-pay | Admitting: Cardiology

## 2017-04-18 ENCOUNTER — Ambulatory Visit (INDEPENDENT_AMBULATORY_CARE_PROVIDER_SITE_OTHER)
Admission: RE | Admit: 2017-04-18 | Discharge: 2017-04-18 | Disposition: A | Discharge status: Home / Self Care | Payer: PRIVATE HEALTH INSURANCE | Source: Ambulatory Visit | Attending: Family Medicine | Admitting: Family Medicine

## 2017-04-18 ENCOUNTER — Ambulatory Visit (INDEPENDENT_AMBULATORY_CARE_PROVIDER_SITE_OTHER): Payer: PRIVATE HEALTH INSURANCE | Admitting: Family Medicine

## 2017-04-18 ENCOUNTER — Encounter: Payer: Self-pay | Admitting: Family Medicine

## 2017-04-18 VITALS — BP 114/70 | HR 59 | Temp 98.3°F | Ht 60.0 in | Wt 116.5 lb

## 2017-04-18 DIAGNOSIS — M71162 Other infective bursitis, left knee: Secondary | ICD-10-CM

## 2017-04-18 DIAGNOSIS — M25562 Pain in left knee: Secondary | ICD-10-CM | POA: Diagnosis not present

## 2017-04-18 DIAGNOSIS — M79605 Pain in left leg: Secondary | ICD-10-CM

## 2017-04-18 NOTE — Progress Notes (Signed)
Pre visit review using our clinic review tool, if applicable. No additional management support is needed unless otherwise documented below in the visit note. 

## 2017-04-18 NOTE — Progress Notes (Signed)
Dr. Karleen Hampshire T. Loralye Loberg, MD, CAQ Sports Medicine Primary [facility] and Sports Medicine [address] [place], [address] Phone: [phone] Fax: [phone]  04/18/2017  Patient: Victoria [name], MRN: [id number], DOB: March 15, 1961, 56 y.o.  Primary Physician:  Nicki Reaper, NP   Chief Complaint  Patient presents with  . Knee Injury    Slid into 3rd base on 04/06/17   Subjective:   Victoria [name] is a 56 y.o. very pleasant female patient who presents with the following:  Pleasant patient with a mechanical heart valve who is on Coumadin chronically presents after a traumatic injury to her L knee on 04/06/2017. 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 She was advised to apply ice, elevate her leg, and use an Ace wrap. She has been using a cane to ambulate.  She came to our office on 04/11/2017, and at that point she saw Mrs. Baity, who felt like she likely had an infected traumatic hematoma. She was placed on Augmentin 875 milligrams by mouth twice a day for 10 days.  She is now on day #8 of her antibiotics. Her pain has persisted and worsened. It looks as if she called our office on April 20, and again on April 23 she spoke to her RN from her employee health. Pain was noted to be severe.  I examine her today for the first time. The patient has dramatic pain and extensive ecchymosis throughout her lower extremity and around the knee. She has a reddened warm area in the anterior aspect of her knee in the prepatellar region. She has pain with standing, ambulation, and with movement of the knee.  Past Medical History, Surgical History, Social History, Family History, Problem List, Medications, and Allergies have been reviewed and updated if relevant.  Patient Active Problem List   Diagnosis Date Noted  . Essential hypertension 01/03/2014  . Dyslipidemia, goal LDL below 130  01/03/2014  . Long term current use of anticoagulant therapy 03/13/2013  . H/O aortic valve replacement 03/13/2013    Past Medical History:  Diagnosis Date  . History of bicuspid aortic valve     with severe AS and dilated aortic root/arch.  . Hyperlipidemia   . Hypertension   . 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 Jude 21 mm mechanical valve, 24 mm IMA shield vascular graft for the ascending aorta/arch), L carotid artery bypassing    Past Surgical History:  Procedure Laterality Date  . ABDOMINAL HYSTERECTOMY  2000   total  . AORTIC VALVE REPLACEMENT  January 2011   St. Jude 21mm mechanical valve, Bentall procesure & L cartoid artery bypassing   . BENTALL PROCEDURE  January 2011   See above  . CARDIAC CATHETERIZATION  December 2010   No significant coronary disease. Normal LV function.  . TRANSTHORACIC ECHOCARDIOGRAM  April 2014   Normal LV size and thickness. EF 65-70%.; Grade 3 diastolic dysfunction with elevated LAP; 21 mm tilting-disc St. Jude valve functioning normally. No pannus. Mild-moderate stenosis with velocities higher than anticipated for valve type. Peak gradient 35-39 mmHg, mean 20-24 mmHg.  Marland Kitchen TRANSTHORACIC ECHOCARDIOGRAM  January 2016   Normal EF (60-65%), Normal wall motion. 21 mm St Jude Mech AoV Prosthesis- well seated, mild AI (not peri-valvular leak as originally noted). Peak-Mean Gradients: 35 mmHg & 22 mHg (likely size related).  Stable    Social History  Social History  . Marital status: Divorced    Spouse name: N/A  . Number of children: 1  . Years of education: N/A   Occupational History  .  [employer]   Social History Main Topics  . Smoking status: Former Smoker    Types: Cigarettes    Quit date: 01/01/2010  . Smokeless tobacco: Never Used  . Alcohol use 18.0 oz/week    12 Standard drinks or equivalent, 18 Cans of beer per week  . Drug use: No  . Sexual activity: Yes    Birth control/ protection: Surgical   Other Topics  Concern  . Not on file   Social History Narrative   She is a divorced mother of one, grandmother 1. She usually exercises 4-5 days a week. Very active.   Quit smoking in 2011.   Takes 1-2 beers per day and is stable.    Family History  Problem Relation Age of Onset  . Stroke Father   . Hypertension Father   . Breast cancer Maternal Grandmother   . Lung cancer Maternal Grandmother     Allergies  Allergen Reactions  . Lisinopril     Medication list reviewed and updated in full in [place] [facility].  GEN: No fevers, chills. Nontoxic. Primarily MSK c/o today. MSK: Detailed in the HPI GI: tolerating PO intake without difficulty Neuro: No numbness, parasthesias, or tingling associated. Otherwise the pertinent positives of the ROS are noted above.   Objective:   BP 114/70   Pulse (!) 59   Temp 98.3 F (36.8 C) (Oral)   Ht 5' (1.524 m)   Wt 116 lb 8 oz (52.8 kg)   BMI 22.75 kg/m    GEN: WDWN, NAD, Non-toxic, Alert & Oriented x 3 HEENT: Atraumatic, Normocephalic.  Ears and Nose: No external deformity. EXTR: No clubbing/cyanosis/trace lower extremity edema. Extensive ecchymosis throughout the lower extremity. NEURO: Notable antalgic gait. PSYCH: Normally interactive. Conversant. Not depressed or anxious appearing.  Calm demeanor.    Left knee: Large taut, red, warm effusion in the prepatellar bursa region. The patient lacks 3 of extension. She is able to flex her knee 30 only.  The patella moves easily. There appears to be a minimal intra-articular effusion. The patient does have tenderness at the proximal tibia. Nontender in this at the proximal fibula. Mid shaft and distal tibia and fibula are nontender. Grossly stable MCL and LCL. Unable to fully assess anterior cruciate ligament and PCL secondary to loss of motion and acute pain. No significant tenderness in the distal femur.  Radiology: Dg Tibia/fibula Left  Result Date: 04/18/2017 CLINICAL DATA:  L knee and lower  leg pain and bruising x 2 weeks after trauma ( slid into base during baseball game), no priors. EXAM: LEFT TIBIA AND FIBULA - 2 VIEW COMPARISON:  None. FINDINGS: There is no evidence of fracture or other focal bone lesions. Soft tissue prominence in the soft tissues anterior to the patellar tendon most consistent with a prepatellar tendon hematoma. IMPRESSION: 1.  No acute osseous injury of the left tibia and fibula. 2. Prepatellar tendon hematoma. Electronically Signed   By: Elige Ko   On: 04/18/2017 11:47   Dg Knee Complete 4 Views Left  Result Date: 04/18/2017 CLINICAL DATA:  Lower leg pain and bruising for 2 weeks EXAM: LEFT KNEE - COMPLETE 4+ VIEW COMPARISON:  None. FINDINGS: No evidence of fracture, dislocation, or joint effusion. No evidence of arthropathy or other focal bone abnormality. Soft tissue prominence in the  soft tissues anterior to the patellar tendon most consistent with a prepatellar tendon hematoma. IMPRESSION: 1.  No acute osseous injury of the left knee. 2. Soft tissue prominence in the soft tissues anterior to the patellar tendon most consistent with a prepatellar tendon hematoma. Electronically Signed   By: Elige Ko   On: 04/18/2017 11:45     Assessment and Plan:   Septic prepatellar bursitis of left knee - Plan: Ambulatory referral to Orthopedic Surgery  Acute pain of left knee - Plan: DG Knee Complete 4 Views Left, Ambulatory referral to Orthopedic Surgery  Acute pain of left lower extremity - Plan: DG Tibia/Fibula Left, Ambulatory referral to Orthopedic Surgery  >40 minutes spent in face to face time with patient, >50% spent in counselling or coordination of care   Level of Medical Decision-Making in this case is HIGH.  High level of clinical concern in this case where a patient has warm, red, likely infected prepatellar bursitis versus hematoma. Previously seen by 2 other providers and has failed 8 days of oral Augmentin. Increased pain. Minimal movement. No  fracture.  High level of clinical concern for potential septic prepatellar bursa. I think in this case that rapid surgical evaluation is most appropriate. Thankfully Dr. Thurston Hole will be able to see her today shortly, and he previously has operated on her in the past.  Follow-up: With orthopedic surgery.  Meds ordered this encounter  Medications  . PICATO 0.015 % GEL    Sig: APPLY TO AFFECTED AREA AT BEDTIME X3 NIGHTS    Refill:  0   There are no discontinued medications. Orders Placed This Encounter  Procedures  . DG Knee Complete 4 Views Left  . DG Tibia/Fibula Left  . Ambulatory referral to Orthopedic Surgery    Signed,  Karleen Hampshire T. Bekah Igoe, MD   Allergies as of 04/18/2017      Reactions   Lisinopril       Medication List       Accurate as of 04/18/17  2:06 PM. Always use your most recent med list.          acetaminophen 500 MG tablet Commonly known as:  TYLENOL Take 1 tablet (500 mg total) by mouth every 6 (six) hours as needed.   amLODipine 10 MG tablet Commonly known as:  NORVASC TAKE 1 TABLET BY MOUTH ONCE DAILY   amoxicillin-clavulanate 875-125 MG tablet Commonly known as:  AUGMENTIN Take 1 tablet by mouth 2 (two) times daily.   atorvastatin 40 MG tablet Commonly known as:  LIPITOR TAKE 1 TABLET BY MOUTH ONCE DAILY AT BEDTIME   carvedilol 3.125 MG tablet Commonly known as:  COREG TAKE 1 TABLET BY MOUTH EVERY 12 HOURS   HYDROcodone-acetaminophen 5-325 MG tablet Commonly known as:  NORCO/VICODIN Take 1 tablet by mouth every 8 (eight) hours as needed for moderate pain.   PICATO 0.015 % Gel Generic drug:  Ingenol Mebutate APPLY TO AFFECTED AREA AT BEDTIME X3 NIGHTS   warfarin 5 MG tablet Commonly known as:  COUMADIN TAKE 1 1/2 TO 2 TABLETS BY MOUTH ONCE DAILY PER COUMADIN CLINIC

## 2017-04-18 NOTE — Patient Instructions (Signed)

## 2017-04-22 ENCOUNTER — Institutional Professional Consult (permissible substitution): Payer: PRIVATE HEALTH INSURANCE | Admitting: Family Medicine

## 2017-04-23 ENCOUNTER — Telehealth: Payer: Self-pay | Admitting: Internal Medicine

## 2017-04-23 NOTE — Telephone Encounter (Signed)
[facility] faxed attending physician statement I spoke with pt she stated murphy wainer has her out of work till 5/9.  Paperwork in [facility] IN BOX For review and signature

## 2017-04-23 NOTE — Telephone Encounter (Signed)
Done, given back to [name] 

## 2017-04-24 NOTE — Telephone Encounter (Signed)
Pt aware.

## 2017-04-24 NOTE — Telephone Encounter (Signed)
Paperwork faxed °Copy for pt °Copy for file °Copy for scan °

## 2017-05-01 ENCOUNTER — Telehealth: Payer: Self-pay | Admitting: Internal Medicine

## 2017-05-01 NOTE — Telephone Encounter (Signed)
[facility] faxed attending physician's statement Spoke to pt she stated dr Thurston Holewainer took her out of work until 5/21.

## 2017-05-02 NOTE — Telephone Encounter (Signed)
Done, given back to [name] 

## 2017-05-02 NOTE — Telephone Encounter (Signed)
Paperwork faxed °Pt aware °Copy for pt °Copy for file °Copy for scan °

## 2017-05-03 ENCOUNTER — Telehealth: Payer: Self-pay | Admitting: Cardiology

## 2017-05-07 ENCOUNTER — Telehealth: Payer: Self-pay | Admitting: Pharmacist

## 2017-05-07 NOTE — Telephone Encounter (Signed)
-----   Message from Loraine GripHaley S Workman, RN sent at 05/07/2017 11:49 AM EDT ----- Regarding: PT/INR draw Good morning ladies,  [name] has been out on short term disability r/t a traumatic septic hematoma of her knee. She has not had a PT/INR draw since the beginning of April. She is not due back to work until next Tuesday 5/22. She said she tried to call the office twice last week to see about coming in to have her draw done in the office, but did not receive a call back. Could you please let her if she needs to come in this week to have the draw done in the office or if she can wait another week and see me on 5/22? And please let me know the decision so I can follow up with her? Thank you so much!  Rolly SalterHaley RN

## 2017-05-07 NOTE — Telephone Encounter (Signed)
Septic hematoma - drained by MD  Doxycycline 100mg  twice daily x 10 days  Next INR scheduled for 05/08/17

## 2017-05-08 ENCOUNTER — Ambulatory Visit (INDEPENDENT_AMBULATORY_CARE_PROVIDER_SITE_OTHER): Payer: PRIVATE HEALTH INSURANCE | Admitting: Pharmacist

## 2017-05-08 DIAGNOSIS — Z7901 Long term (current) use of anticoagulants: Secondary | ICD-10-CM | POA: Diagnosis not present

## 2017-05-08 DIAGNOSIS — Z952 Presence of prosthetic heart valve: Secondary | ICD-10-CM | POA: Diagnosis not present

## 2017-05-08 DIAGNOSIS — I10 Essential (primary) hypertension: Secondary | ICD-10-CM

## 2017-05-08 LAB — POCT INR: INR: 5.7

## 2017-05-14 ENCOUNTER — Ambulatory Visit: Payer: Self-pay | Admitting: *Deleted

## 2017-05-14 VITALS — BP 123/74 | HR 58

## 2017-05-14 DIAGNOSIS — Z952 Presence of prosthetic heart valve: Secondary | ICD-10-CM

## 2017-05-14 DIAGNOSIS — Z7901 Long term (current) use of anticoagulants: Secondary | ICD-10-CM

## 2017-05-14 NOTE — Progress Notes (Signed)
Pt had PT/INR check in cardiology office last week while on medical leave. INR 5.5. Told to hold 2 days of warfarin. Recheck level today. Lab drawn. BP check WNL as well. Will route to Cardiology Pharm upon receipt for them to f/u with pt.

## 2017-05-15 LAB — PROTIME-INR
INR: 3.4 — AB (ref 0.8–1.2)
PROTHROMBIN TIME: 33.3 s — AB (ref 9.1–12.0)

## 2017-05-16 ENCOUNTER — Ambulatory Visit (INDEPENDENT_AMBULATORY_CARE_PROVIDER_SITE_OTHER): Payer: PRIVATE HEALTH INSURANCE | Admitting: Pharmacist

## 2017-05-16 DIAGNOSIS — Z952 Presence of prosthetic heart valve: Secondary | ICD-10-CM

## 2017-05-16 DIAGNOSIS — Z7901 Long term (current) use of anticoagulants: Secondary | ICD-10-CM

## 2017-05-16 NOTE — Progress Notes (Signed)
Message left on pt's personal voicemail with results. Routed to cardiology pharmacist Lonia Skinneraquel Rodriguez for pt f/u.

## 2017-06-14 ENCOUNTER — Ambulatory Visit: Payer: Self-pay | Admitting: *Deleted

## 2017-06-14 VITALS — BP 125/78 | HR 59 | Ht 60.5 in | Wt 118.0 lb

## 2017-06-14 DIAGNOSIS — Z0000 Encounter for general adult medical examination without abnormal findings: Secondary | ICD-10-CM

## 2017-06-14 DIAGNOSIS — Z952 Presence of prosthetic heart valve: Secondary | ICD-10-CM

## 2017-06-14 DIAGNOSIS — Z7901 Long term (current) use of anticoagulants: Secondary | ICD-10-CM

## 2017-06-14 NOTE — Progress Notes (Signed)
[employer] insurance premium discount evaluation: Labs Drawn. [employer] form signed. Tobacco Free Attestation form signed.  Forms placed in paper chart. Pt/INR also drawn. Will route to PCP and Cardiology upon receipt.

## 2017-06-15 LAB — CMP12+LP+TP+TSH+6AC+CBC/D/PLT
ALBUMIN: 4.7 g/dL (ref 3.5–5.5)
ALT: 16 IU/L (ref 0–32)
AST: 31 IU/L (ref 0–40)
Albumin/Globulin Ratio: 1.7 (ref 1.2–2.2)
Alkaline Phosphatase: 82 IU/L (ref 39–117)
BASOS ABS: 0 10*3/uL (ref 0.0–0.2)
BILIRUBIN TOTAL: 1.3 mg/dL — AB (ref 0.0–1.2)
BUN/Creatinine Ratio: 24 — ABNORMAL HIGH (ref 9–23)
BUN: 14 mg/dL (ref 6–24)
Basos: 0 %
CHLORIDE: 101 mmol/L (ref 96–106)
CHOLESTEROL TOTAL: 146 mg/dL (ref 100–199)
Calcium: 9.6 mg/dL (ref 8.7–10.2)
Chol/HDL Ratio: 2.1 ratio (ref 0.0–4.4)
Creatinine, Ser: 0.59 mg/dL (ref 0.57–1.00)
EOS (ABSOLUTE): 0.1 10*3/uL (ref 0.0–0.4)
Eos: 2 %
FREE THYROXINE INDEX: 1.8 (ref 1.2–4.9)
GFR calc Af Amer: 120 mL/min/{1.73_m2} (ref >59)
GFR calc non Af Amer: 104 mL/min/{1.73_m2} (ref >59)
GGT: 19 IU/L (ref 0–60)
GLOBULIN, TOTAL: 2.7 g/dL (ref 1.5–4.5)
Glucose: 103 mg/dL — ABNORMAL HIGH (ref 65–99)
HDL: 70 mg/dL (ref >39)
Hematocrit: 39.7 % (ref 34.0–46.6)
Hemoglobin: 12.5 g/dL (ref 11.1–15.9)
IMMATURE GRANULOCYTES: 0 %
IRON: 82 ug/dL (ref 27–159)
Immature Grans (Abs): 0 10*3/uL (ref 0.0–0.1)
LDH: 396 IU/L — ABNORMAL HIGH (ref 119–226)
LDL Calculated: 62 mg/dL (ref 0–99)
LYMPHS ABS: 1.4 10*3/uL (ref 0.7–3.1)
Lymphs: 20 %
MCH: 28.5 pg (ref 26.6–33.0)
MCHC: 31.5 g/dL (ref 31.5–35.7)
MCV: 91 fL (ref 79–97)
MONOS ABS: 0.6 10*3/uL (ref 0.1–0.9)
Monocytes: 9 %
NEUTROS PCT: 69 %
Neutrophils Absolute: 4.8 10*3/uL (ref 1.4–7.0)
PLATELETS: 238 10*3/uL (ref 150–379)
Phosphorus: 3.5 mg/dL (ref 2.5–4.5)
Potassium: 4.3 mmol/L (ref 3.5–5.2)
RBC: 4.38 x10E6/uL (ref 3.77–5.28)
RDW: 14.2 % (ref 12.3–15.4)
Sodium: 140 mmol/L (ref 134–144)
T3 UPTAKE RATIO: 26 % (ref 24–39)
T4, Total: 7 ug/dL (ref 4.5–12.0)
TOTAL PROTEIN: 7.4 g/dL (ref 6.0–8.5)
TSH: 1.6 u[IU]/mL (ref 0.450–4.500)
Triglycerides: 70 mg/dL (ref 0–149)
Uric Acid: 5 mg/dL (ref 2.5–7.1)
VLDL CHOLESTEROL CAL: 14 mg/dL (ref 5–40)
WBC: 6.9 10*3/uL (ref 3.4–10.8)

## 2017-06-15 LAB — HGB A1C W/O EAG: Hgb A1c MFr Bld: 5.5 % (ref 4.8–5.6)

## 2017-06-15 LAB — PROTIME-INR
INR: 2.5 — AB (ref 0.8–1.2)
PROTHROMBIN TIME: 25 s — AB (ref 9.1–12.0)

## 2017-06-17 ENCOUNTER — Ambulatory Visit (INDEPENDENT_AMBULATORY_CARE_PROVIDER_SITE_OTHER): Payer: PRIVATE HEALTH INSURANCE | Admitting: Pharmacist Clinician (PhC)/ Clinical Pharmacy Specialist

## 2017-06-17 DIAGNOSIS — Z952 Presence of prosthetic heart valve: Secondary | ICD-10-CM

## 2017-06-17 DIAGNOSIS — Z7901 Long term (current) use of anticoagulants: Secondary | ICD-10-CM

## 2017-06-17 NOTE — Progress Notes (Signed)
Results routed to PCP and Cardiology pharmacists.

## 2017-08-16 ENCOUNTER — Telehealth: Payer: Self-pay | Admitting: Pharmacist Clinician (PhC)/ Clinical Pharmacy Specialist

## 2017-08-19 ENCOUNTER — Ambulatory Visit: Payer: Self-pay | Admitting: *Deleted

## 2017-08-19 VITALS — BP 119/73 | HR 54

## 2017-08-19 DIAGNOSIS — Z952 Presence of prosthetic heart valve: Secondary | ICD-10-CM

## 2017-08-19 DIAGNOSIS — Z7901 Long term (current) use of anticoagulants: Secondary | ICD-10-CM

## 2017-08-19 NOTE — Progress Notes (Signed)
BP check with PT/INR drawn. Will route results to cardiology pharmacist upon receipt.

## 2017-08-20 ENCOUNTER — Ambulatory Visit (INDEPENDENT_AMBULATORY_CARE_PROVIDER_SITE_OTHER): Payer: PRIVATE HEALTH INSURANCE | Admitting: Pharmacist Clinician (PhC)/ Clinical Pharmacy Specialist

## 2017-08-20 DIAGNOSIS — Z952 Presence of prosthetic heart valve: Secondary | ICD-10-CM

## 2017-08-20 DIAGNOSIS — Z7901 Long term (current) use of anticoagulants: Secondary | ICD-10-CM

## 2017-08-20 LAB — PROTIME-INR
INR: 3.4 — AB (ref 0.8–1.2)
Prothrombin Time: 32.7 s — ABNORMAL HIGH (ref 9.1–12.0)

## 2017-08-20 NOTE — Progress Notes (Signed)
Pt made aware of results. Results routed to Cardiology pharmacists Phillips Hay and Raquel Rodriguez-Guzman that typically manage pt. Pt has no further questions/concerns.

## 2017-08-21 NOTE — Telephone Encounter (Signed)
Coumadin letter 

## 2017-09-20 ENCOUNTER — Ambulatory Visit: Payer: Self-pay | Admitting: *Deleted

## 2017-09-20 ENCOUNTER — Other Ambulatory Visit: Payer: Self-pay | Admitting: Cardiology

## 2017-09-20 VITALS — BP 124/72 | HR 58

## 2017-09-20 DIAGNOSIS — Z7901 Long term (current) use of anticoagulants: Secondary | ICD-10-CM

## 2017-09-20 DIAGNOSIS — Z952 Presence of prosthetic heart valve: Secondary | ICD-10-CM

## 2017-09-20 NOTE — Addendum Note (Signed)
Addended by: Loraine Grip on: 09/20/2017 10:04 AM   Modules accepted: Orders

## 2017-09-20 NOTE — Progress Notes (Signed)
q4wks PT/INR drawn per standing orders.

## 2017-09-21 LAB — PROTIME-INR
INR: 3.7 — ABNORMAL HIGH (ref 0.8–1.2)
Prothrombin Time: 35.4 s — ABNORMAL HIGH (ref 9.1–12.0)

## 2017-09-23 ENCOUNTER — Ambulatory Visit (INDEPENDENT_AMBULATORY_CARE_PROVIDER_SITE_OTHER): Payer: PRIVATE HEALTH INSURANCE | Admitting: Pharmacist Clinician (PhC)/ Clinical Pharmacy Specialist

## 2017-09-23 DIAGNOSIS — Z952 Presence of prosthetic heart valve: Secondary | ICD-10-CM

## 2017-09-23 DIAGNOSIS — Z7901 Long term (current) use of anticoagulants: Secondary | ICD-10-CM | POA: Diagnosis not present

## 2017-09-23 NOTE — Progress Notes (Signed)
Pt made aware pf lab results. INR slightly elevated from goal therapeutic range. Results routed to Pharmacists at [facility] managing pt. No further needs at this time.

## 2017-09-24 ENCOUNTER — Ambulatory Visit: Payer: Self-pay | Admitting: Registered Nurse

## 2017-09-24 VITALS — BP 129/79 | HR 63 | Temp 98.6°F

## 2017-09-24 DIAGNOSIS — R791 Abnormal coagulation profile: Secondary | ICD-10-CM

## 2017-09-24 DIAGNOSIS — H538 Other visual disturbances: Secondary | ICD-10-CM

## 2017-09-24 DIAGNOSIS — B351 Tinea unguium: Secondary | ICD-10-CM

## 2017-09-24 MED ORDER — CICLOPIROX 8 % EX SOLN: Freq: Every day | CUTANEOUS | 1 refills | Status: AC

## 2017-09-24 NOTE — Patient Instructions (Addendum)
Fungal Nail Infection Fungal nail infection is a common fungal infection of the toenails or fingernails. This condition affects toenails more often than fingernails. More than one nail may be infected. The condition can be passed from person to person (is contagious). What are the causes? This condition is caused by a fungus. Several types of funguses can cause the infection. These funguses are common in moist and warm areas. If your hands or feet come into contact with the fungus, it may get into a crack in your fingernail or toenail and cause the infection. What increases the risk? The following factors may make you more likely to develop this condition:  Being female.  Having diabetes.  Being of older age.  Living with someone who has the fungus.  Walking barefoot in areas where the fungus thrives, such as showers or locker rooms.  Having poor circulation.  Wearing shoes and socks that cause your feet to sweat.  Having athlete's foot.  Having a nail injury or history of a recent nail surgery.  Having psoriasis.  Having a weak body defense system (immune system).  What are the signs or symptoms? Symptoms of this condition include:  A pale spot on the nail.  Thickening of the nail.  A nail that becomes yellow or brown.  A brittle or ragged nail edge.  A crumbling nail.  A nail that has lifted away from the nail bed.  How is this diagnosed? This condition is diagnosed with a physical exam. Your health care provider may take a scraping or clipping from your nail to test for the fungus. How is this treated? Mild infections do not need treatment. If you have significant nail changes, treatment may include:  Oral antifungal medicines. You may need to take the medicine for several weeks or several months, and you may not see the results for a long time. These medicines can cause side effects. Ask your health care provider what problems to watch for.  Antifungal nail polish  and nail cream. These may be used along with oral antifungal medicines.  Laser treatment of the nail.  Surgery to remove the nail. This may be needed for the most severe infections.  Treatment takes a long time, and the infection may come back. Follow these instructions at home: Medicines  Take or apply over-the-counter and prescription medicines only as told by your health care provider.  Ask your health care provider about using over-the-counter mentholated ointment on your nails. Lifestyle   Do not share personal items, such as towels or nail clippers.  Trim your nails often.  Wash and dry your hands and feet every day.  Wear absorbent socks, and change your socks frequently.  Wear shoes that allow air to circulate, such as sandals or canvas tennis shoes. Throw out old shoes.  Wear rubber gloves if you are working with your hands in wet areas.  Do not walk barefoot in shower rooms or locker rooms.  Do not use a nail salon that does not use clean instruments.  Do not use artificial nails. General instructions  Keep all follow-up visits as told by your health care provider. This is important.  Use antifungal foot powder on your feet and in your shoes. Contact a health care provider if: Your infection is not getting better or it is getting worse after several months. This information is not intended to replace advice given to you by your health care provider. Make sure you discuss any questions you have with your health   care provider. Document Released: 12/07/2000 Document Revised: 05/17/2016 Document Reviewed: 06/13/2015 Elsevier Interactive Patient Education  2018 [employer]. Blurred Vision Having blurred vision means that you cannot see things clearly. Your vision may seem fuzzy or out of focus. Blurred vision is a very common symptom of an eye or vision problem. Blurred vision is often a gradual blur that occurs in one eye or both eyes. There are many causes of  blurred vision, including cataracts, macular degeneration, and diabetic retinopathy. Blurred vision can be diagnosed based on your symptoms and a physical exam. Tell your health care provider about any other health problems you have, any recent eye injury, and any prior surgeries. You may need to see a health care provider who specializes in eye problems (ophthalmologist). Your treatment depends on what is causing your blurred vision.  HOME CARE INSTRUCTIONS  Tell your health care provider about any changes in your blurred vision.  Do not drive or operate heavy machinery if your vision is blurry.  Keep all follow-up visits as directed by your health care provider. This is important. SEEK MEDICAL CARE IF:  Your symptoms get worse.  You have new symptoms.  You have trouble seeing at night.  You have trouble seeing up close or far away.  You have trouble noticing the difference between colors. SEEK IMMEDIATE MEDICAL CARE IF:  You have severe eye pain.  You have a severe headache.  You have flashing lights in your field of vision.  You have a sudden change in vision.  You have a sudden loss of vision.  You have vision change after an injury.  You notice drainage coming from your eyes.  You notice a rash around your eyes. This information is not intended to replace advice given to you by your health care provider. Make sure you discuss any questions you have with your health care provider. Document Released: 12/13/2003 Document Revised: 04/26/2015 Document Reviewed: 11/03/2014 Elsevier Interactive Patient Education  2017 [employer].

## 2017-09-24 NOTE — Progress Notes (Signed)
Subjective:    Patient ID: Victoria [name], female    DOB: Sep 01, 1961, 56 y.o.   MRN: [id number]  55y/o married caucasian female established Pt reports toenail fungus to R great toe. Nail painted currently with red nailpolish. Reports nail with yellow coloring and brittle "ugly".  Patient gets pedicures reported that nail discoloration has occurred for months and she does see nail tech disinfect pedicure bowl but unsure if each nail set designated for specific client or if they are autoclaving tools.  Patient denied pain/trauma/discharge/swelling to affected toe.  Patient also verified she received INR/PT results from RN [facility] yesterday and was contacted by her coumadin clinic staff for dosage change as INR too high.  Patient noticed she was bruising easier than normal also on arm yesterday.  Patient has noticed blurring of near vision over the past year.  Has not seen optometrist in a long time borrows spouse glasses but yesterday mother reported she was diagnosed with a condition that can lead to blindness.  Patient requesting assistance with booking optometry appt.      Review of Systems  Constitutional: Negative for activity change, appetite change, chills, diaphoresis, fatigue, fever and unexpected weight change.  HENT: Negative for trouble swallowing and voice change.   Eyes: Positive for visual disturbance. Negative for photophobia, pain, discharge, redness and itching.  Respiratory: Negative for cough, shortness of breath and wheezing.   Cardiovascular: Negative for chest pain and leg swelling.  Gastrointestinal: Negative for blood in stool, diarrhea and vomiting.  Endocrine: Negative for cold intolerance and heat intolerance.  Musculoskeletal: Negative for arthralgias, back pain, gait problem, joint swelling, myalgias, neck pain and neck stiffness.  Skin: Negative for color change, pallor, rash and wound.  Allergic/Immunologic: Negative for environmental allergies and food  allergies.  Neurological: Negative for tremors, weakness and numbness.  Hematological: Negative for adenopathy. Bruises/bleeds easily.  Psychiatric/Behavioral: Negative for agitation, confusion and sleep disturbance.       Objective:   Physical Exam  Constitutional: She is oriented to person, place, and time. Vital signs are normal. She appears well-developed and well-nourished.  Non-toxic appearance. She does not have a sickly appearance. She does not appear ill. No distress.  HENT:  Head: Normocephalic and atraumatic.  Right Ear: Hearing and external ear normal.  Left Ear: Hearing and external ear normal.  Nose: Nose normal.  Mouth/Throat: Uvula is midline, oropharynx is clear and moist and mucous membranes are normal. No oropharyngeal exudate.  Eyes: Pupils are equal, round, and reactive to light. Conjunctivae, EOM and lids are normal. Right eye exhibits no chemosis, no discharge, no exudate and no hordeolum. No foreign body present in the right eye. Left eye exhibits no chemosis, no discharge, no exudate and no hordeolum. No foreign body present in the left eye. Right conjunctiva is not injected. Right conjunctiva has no hemorrhage. Left conjunctiva is not injected. Left conjunctiva has no hemorrhage. No scleral icterus. Right eye exhibits normal extraocular motion and no nystagmus. Left eye exhibits normal extraocular motion and no nystagmus. Right pupil is round and reactive. Left pupil is round and reactive. Pupils are equal.  Distant snellen without correction 20/20 bilaterally  Neck: Trachea normal and normal range of motion. Neck supple. No neck rigidity. No tracheal deviation, no edema, no erythema and normal range of motion present.  Cardiovascular: Normal rate, regular rhythm, normal heart sounds and intact distal pulses.   Pulmonary/Chest: Effort normal and breath sounds normal. No accessory muscle usage or stridor. No respiratory distress. She  has no wheezes. She has no rhonchi.  She has no rales.  Abdominal: Normal appearance. She exhibits no distension, no fluid wave and no ascites. There is no rigidity and no guarding.  Musculoskeletal: Normal range of motion. She exhibits no edema, tenderness or deformity.       Right shoulder: Normal.       Left shoulder: Normal.       Right elbow: Normal.      Right hip: Normal.       Left hip: Normal.       Right knee: Normal.       Left knee: Normal.       Cervical back: Normal.       Thoracic back: Normal.       Lumbar back: Normal.       Right hand: Normal.       Left hand: Normal.  Lymphadenopathy:       Head (right side): No submental, no submandibular, no tonsillar, no preauricular, no posterior auricular and no occipital adenopathy present.       Head (left side): No submental, no submandibular, no tonsillar, no preauricular, no posterior auricular and no occipital adenopathy present.    She has no cervical adenopathy.  Neurological: She is alert and oriented to person, place, and time. She is not disoriented. She displays no atrophy and no tremor. No cranial nerve deficit or sensory deficit. She exhibits normal muscle tone. She displays no seizure activity. Coordination and gait normal. GCS eye subscore is 4. GCS verbal subscore is 5. GCS motor subscore is 6.  In/out of chair without difficulty; gait sure and steady in hall  Skin: Skin is warm, dry and intact. No abrasion, no bruising, no burn, no ecchymosis, no laceration, no lesion, no petechiae and no rash noted. She is not diaphoretic. No cyanosis or erythema. No pallor. Nails show no clubbing.  Psychiatric: She has a normal mood and affect. Her speech is normal and behavior is normal. Judgment and thought content normal. Cognition and memory are normal.  Nursing note and vitals reviewed.         Assessment & Plan:  A-onychomycosis right great toe; blurred near vision; prolonged INR  P-patient wants to start topical medication ciclopirox topical 8% solution  apply to right big toenail at bedtime at least 8 hours before shower/bath #1 RF1.  Clean nail with alcohol rubbing every 7 days will need to continue for at least 6 months.  Toenails grow slowly Will requires months of therapy. Discussed OTC antifungal powder in footwear.  Rotating footwear and placing footwear in sun to dry out between use. Exitcare handout on onychomycosis given to patient. Discussed oral lamisil therapy can increase or decrease warfarin effectiveness and not recommended for her at this time due to still titrating her dose as she was not anticoagulated enough earlier this year and now INR too high and titrating dose again.  Patient verbalized understanding and agreed with plan of care and had no further questions at this time.   Patient assisted with booking appt [facility] in [place] at [phone] tomorrow for routine optometry appt due to blurred near vision for many months now.  Distant vision 20/20 on snellen today uncorrected.  Patient denied eye pain, nausea/vomiting/headache.  Unsure what disease her mother diagnosed with and will follow up with her mother so she can notify optometrist tomorrow.  Patient agreed with plan of care and had no further questions at this time.

## 2017-10-08 ENCOUNTER — Other Ambulatory Visit: Payer: Self-pay | Admitting: Cardiology

## 2017-10-09 ENCOUNTER — Other Ambulatory Visit: Payer: Self-pay | Admitting: Cardiology

## 2017-10-21 ENCOUNTER — Ambulatory Visit: Payer: Self-pay | Admitting: *Deleted

## 2017-10-21 ENCOUNTER — Other Ambulatory Visit: Payer: Self-pay | Admitting: Internal Medicine

## 2017-10-21 VITALS — BP 126/74 | HR 55

## 2017-10-21 DIAGNOSIS — Z952 Presence of prosthetic heart valve: Secondary | ICD-10-CM

## 2017-10-21 DIAGNOSIS — Z1231 Encounter for screening mammogram for malignant neoplasm of breast: Secondary | ICD-10-CM

## 2017-10-21 DIAGNOSIS — Z7901 Long term (current) use of anticoagulants: Secondary | ICD-10-CM

## 2017-10-21 NOTE — Progress Notes (Signed)
Monthly PT/INR drawn for coumadin dosing. BP/pulse check. Will route lab results to cardiology pharmacist upon receipt.

## 2017-10-22 ENCOUNTER — Ambulatory Visit (INDEPENDENT_AMBULATORY_CARE_PROVIDER_SITE_OTHER): Payer: PRIVATE HEALTH INSURANCE | Admitting: Pharmacist Clinician (PhC)/ Clinical Pharmacy Specialist

## 2017-10-22 DIAGNOSIS — Z952 Presence of prosthetic heart valve: Secondary | ICD-10-CM

## 2017-10-22 DIAGNOSIS — Z7901 Long term (current) use of anticoagulants: Secondary | ICD-10-CM | POA: Diagnosis not present

## 2017-10-22 LAB — PROTIME-INR
INR: 4.4 — AB (ref 0.8–1.2)
Prothrombin Time: 42.2 s — ABNORMAL HIGH (ref 9.1–12.0)

## 2017-10-22 NOTE — Progress Notes (Signed)
Results routed to cardiology pharmacists. Attempted to call pt to inform her of results, but no answer. LMTCB.

## 2017-10-22 NOTE — Progress Notes (Signed)
Pt returned call. Informed of results. Pt denies bleeding/bruising easier than usual for her. Reports she had a small amount of spinach this past week and was expecting it to be lower. No further questions/concerns. Further management by cardiology pharmacist that pt is anticipating call from.

## 2017-10-24 ENCOUNTER — Ambulatory Visit
Admission: RE | Admit: 2017-10-24 | Discharge: 2017-10-24 | Disposition: A | Discharge status: Home / Self Care | Payer: PRIVATE HEALTH INSURANCE | Source: Ambulatory Visit | Attending: Internal Medicine | Admitting: Internal Medicine

## 2017-10-24 DIAGNOSIS — Z1231 Encounter for screening mammogram for malignant neoplasm of breast: Secondary | ICD-10-CM | POA: Insufficient documentation

## 2017-11-04 ENCOUNTER — Ambulatory Visit: Payer: Self-pay | Admitting: *Deleted

## 2017-11-04 VITALS — BP 113/65 | HR 50

## 2017-11-04 DIAGNOSIS — Z952 Presence of prosthetic heart valve: Secondary | ICD-10-CM

## 2017-11-04 DIAGNOSIS — Z7901 Long term (current) use of anticoagulants: Secondary | ICD-10-CM

## 2017-11-04 NOTE — Progress Notes (Signed)
BP check and follow up INR check. Elevated at last check. Cardiology pharmacists requested 2 week recheck.

## 2017-11-05 ENCOUNTER — Ambulatory Visit: Payer: Self-pay | Admitting: *Deleted

## 2017-11-05 DIAGNOSIS — Z952 Presence of prosthetic heart valve: Secondary | ICD-10-CM

## 2017-11-05 DIAGNOSIS — Z7901 Long term (current) use of anticoagulants: Secondary | ICD-10-CM

## 2017-11-05 NOTE — Progress Notes (Signed)
Recollect of PT/INR as [facility] reports original sample as "lost". Have been in contact with dispatch and manager at local branch multiple times today. Have informed manager that new sample has been collected and to ensure that only test is charged for. Original lab appt addended and original order cancelled in epic. New order placed for new sample.  Dispatch called to pick up new sample directly from [facility].

## 2017-11-05 NOTE — Addendum Note (Signed)
Addended by: Loraine GripWORKMAN, HALEY S on: 11/05/2017 02:56 PM   Modules accepted: Orders

## 2017-11-06 ENCOUNTER — Ambulatory Visit (INDEPENDENT_AMBULATORY_CARE_PROVIDER_SITE_OTHER): Payer: PRIVATE HEALTH INSURANCE | Admitting: Pharmacist

## 2017-11-06 DIAGNOSIS — Z952 Presence of prosthetic heart valve: Secondary | ICD-10-CM

## 2017-11-06 DIAGNOSIS — Z7901 Long term (current) use of anticoagulants: Secondary | ICD-10-CM | POA: Diagnosis not present

## 2017-11-06 LAB — PROTIME-INR
INR: 2.7 — ABNORMAL HIGH (ref 0.8–1.2)
Prothrombin Time: 26.8 s — ABNORMAL HIGH (ref 9.1–12.0)

## 2017-11-06 NOTE — Progress Notes (Signed)
[facility]. Reviewed with pt via phone. Awaiting recommendations from Pharm. No further questions/concerns.

## 2017-11-26 ENCOUNTER — Other Ambulatory Visit: Payer: Self-pay | Admitting: Cardiology

## 2017-12-03 ENCOUNTER — Ambulatory Visit: Payer: Self-pay | Admitting: *Deleted

## 2017-12-03 VITALS — BP 135/80 | HR 55

## 2017-12-03 DIAGNOSIS — Z952 Presence of prosthetic heart valve: Secondary | ICD-10-CM

## 2017-12-03 DIAGNOSIS — Z7901 Long term (current) use of anticoagulants: Secondary | ICD-10-CM

## 2017-12-03 NOTE — Progress Notes (Signed)
Q4week INR draw and BP check.

## 2017-12-04 ENCOUNTER — Ambulatory Visit (INDEPENDENT_AMBULATORY_CARE_PROVIDER_SITE_OTHER): Payer: PRIVATE HEALTH INSURANCE | Admitting: Pharmacist Clinician (PhC)/ Clinical Pharmacy Specialist

## 2017-12-04 DIAGNOSIS — Z952 Presence of prosthetic heart valve: Secondary | ICD-10-CM | POA: Diagnosis not present

## 2017-12-04 DIAGNOSIS — Z7901 Long term (current) use of anticoagulants: Secondary | ICD-10-CM | POA: Diagnosis not present

## 2017-12-04 LAB — PROTIME-INR
INR: 3.2 — ABNORMAL HIGH (ref 0.8–1.2)
Prothrombin Time: 30.8 s — ABNORMAL HIGH (ref 9.1–12.0)

## 2017-12-30 ENCOUNTER — Ambulatory Visit: Payer: Self-pay | Admitting: *Deleted

## 2017-12-30 VITALS — BP 124/71 | HR 55

## 2017-12-30 DIAGNOSIS — Z7901 Long term (current) use of anticoagulants: Secondary | ICD-10-CM

## 2017-12-30 DIAGNOSIS — Z952 Presence of prosthetic heart valve: Secondary | ICD-10-CM

## 2017-12-30 NOTE — Progress Notes (Signed)
PT/INR drawn for monthly coumadin check. BP check. WNL.  Pt has no complaints or concerns.  Will route results to cardiology pharm upon receipt for dosing.   Started wearing glasses yesterday after first visit to optometrist in multiple years. Reports some difficulty adjusting to them "things coming at me fast," causing intermittent HAs. Suggested taking them off when they are bothering her, wearing again in one hour and gradually increasing the amount of time she wears them. She reports she has been doing this some and it seems to help.

## 2017-12-31 LAB — PROTIME-INR
INR: 3.8 — AB (ref 0.8–1.2)
PROTHROMBIN TIME: 36.7 s — AB (ref 9.1–12.0)

## 2017-12-31 NOTE — Progress Notes (Signed)
Results reviewed with pt. Results routed to cardiology pharmacists with request to contact pt with recommendations or changes. Pt has no further questions/concerns.

## 2018-01-02 ENCOUNTER — Ambulatory Visit (INDEPENDENT_AMBULATORY_CARE_PROVIDER_SITE_OTHER): Payer: PRIVATE HEALTH INSURANCE | Admitting: Pharmacist Clinician (PhC)/ Clinical Pharmacy Specialist

## 2018-01-02 DIAGNOSIS — Z7901 Long term (current) use of anticoagulants: Secondary | ICD-10-CM | POA: Diagnosis not present

## 2018-01-02 DIAGNOSIS — Z952 Presence of prosthetic heart valve: Secondary | ICD-10-CM

## 2018-01-03 NOTE — Progress Notes (Signed)
Per chart review, pharmacist instructed pt to take only 1/2 tab on 01/02/18 instead of her usual 1.5 tabs, then continue usual dosing. Repeat INR in 3 weeks. Appt sent to pt via email calendar. LM on pt's work line as she was not available at this time to confirm that she received these dosing instructions. Will f/u.

## 2018-01-13 ENCOUNTER — Ambulatory Visit (HOSPITAL_COMMUNITY): Payer: PRIVATE HEALTH INSURANCE | Attending: Internal Medicine

## 2018-01-13 DIAGNOSIS — I10 Essential (primary) hypertension: Secondary | ICD-10-CM

## 2018-01-13 DIAGNOSIS — I051 Rheumatic mitral insufficiency: Secondary | ICD-10-CM | POA: Insufficient documentation

## 2018-01-13 DIAGNOSIS — Z952 Presence of prosthetic heart valve: Secondary | ICD-10-CM | POA: Insufficient documentation

## 2018-01-20 ENCOUNTER — Ambulatory Visit: Payer: Self-pay | Admitting: *Deleted

## 2018-01-20 VITALS — BP 113/66 | HR 61

## 2018-01-20 DIAGNOSIS — Z952 Presence of prosthetic heart valve: Secondary | ICD-10-CM

## 2018-01-20 DIAGNOSIS — Z7901 Long term (current) use of anticoagulants: Secondary | ICD-10-CM

## 2018-01-21 ENCOUNTER — Ambulatory Visit (INDEPENDENT_AMBULATORY_CARE_PROVIDER_SITE_OTHER): Payer: PRIVATE HEALTH INSURANCE | Admitting: Pharmacist Clinician (PhC)/ Clinical Pharmacy Specialist

## 2018-01-21 DIAGNOSIS — Z7901 Long term (current) use of anticoagulants: Secondary | ICD-10-CM | POA: Diagnosis not present

## 2018-01-21 DIAGNOSIS — Z952 Presence of prosthetic heart valve: Secondary | ICD-10-CM | POA: Diagnosis not present

## 2018-01-21 LAB — PROTIME-INR
INR: 4 — ABNORMAL HIGH (ref 0.8–1.2)
Prothrombin Time: 38.3 s — ABNORMAL HIGH (ref 9.1–12.0)

## 2018-01-21 NOTE — Progress Notes (Signed)
Results reviewed with pt. She denies any frank bleeding or bruising worse than usual. Results routed to cardiology pharmacists with request to contact pt with recommendations for dosing. Pt aware and awaiting phone call.

## 2018-01-31 ENCOUNTER — Ambulatory Visit: Payer: Self-pay | Admitting: *Deleted

## 2018-01-31 DIAGNOSIS — Z7901 Long term (current) use of anticoagulants: Secondary | ICD-10-CM

## 2018-01-31 DIAGNOSIS — Z952 Presence of prosthetic heart valve: Secondary | ICD-10-CM

## 2018-01-31 NOTE — Progress Notes (Signed)
Pt brought to clinic by supervisor. 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 She reports she was walking and something was in the floor that she slid on, and started falling backwards. Was able to catch herself but had too much momentum and ended up falling forward onto both hands and knees. No bruising, swelling, deformity, or obvious injury noted anywhere. Pt denies pain. Denies hitting head or LOC. No witnesses reported her hitting head or LOC.  Pt is on anticoagulants 2/2 AVR. Last few INRs have been elevated and has been instructed by cardiology pharmacists to skip doses. Most recently though, total weekly dose was decreased by 0.5mg . Was due on Monday for repeat INR. Went ahead and drew today to have results on Monday in case of any complications of fall over the weekend.  Discussed use of Tylenol if needed with pt. Ice packs or Biofreeze to sites of pain as soreness will likely develop over next couple of days. Declines ice pack and biofreeze from clinic stock stating she has both at home.  Pt verbalizes understanding and agreement of plan of care. Will review results of INR with pt upon receipt on Monday and route to cardiology. No further questions/concerns.

## 2018-02-01 LAB — PROTIME-INR
INR: 3.1 — ABNORMAL HIGH (ref 0.8–1.2)
Prothrombin Time: 30.6 s — ABNORMAL HIGH (ref 9.1–12.0)

## 2018-02-03 ENCOUNTER — Ambulatory Visit (INDEPENDENT_AMBULATORY_CARE_PROVIDER_SITE_OTHER): Payer: PRIVATE HEALTH INSURANCE | Admitting: Pharmacist Clinician (PhC)/ Clinical Pharmacy Specialist

## 2018-02-03 DIAGNOSIS — Z7901 Long term (current) use of anticoagulants: Secondary | ICD-10-CM

## 2018-02-03 DIAGNOSIS — Z952 Presence of prosthetic heart valve: Secondary | ICD-10-CM

## 2018-02-03 NOTE — Progress Notes (Signed)
INR result reviewed with pt via phone. Within goal range. Pt also reports no bruising or pain s/p fall on Friday. Feels well. Results routed to cardiology pharmacist for further coumadin dosing. Pt has no further questions or concerns.

## 2018-02-04 NOTE — Progress Notes (Signed)
Per pharmacist encounter notes, mesg left for pt to continue current dosing schedule and repeat INR in 2 weeks. Appt made and sent to pt via email.

## 2018-02-12 ENCOUNTER — Other Ambulatory Visit (HOSPITAL_COMMUNITY): Payer: Self-pay

## 2018-02-17 ENCOUNTER — Ambulatory Visit: Payer: Self-pay | Admitting: *Deleted

## 2018-02-17 VITALS — BP 124/70 | HR 52

## 2018-02-17 DIAGNOSIS — Z7901 Long term (current) use of anticoagulants: Secondary | ICD-10-CM

## 2018-02-17 DIAGNOSIS — Z952 Presence of prosthetic heart valve: Secondary | ICD-10-CM

## 2018-02-17 NOTE — Progress Notes (Signed)
PT/INR check. 2 weeks since previous. Requested per cardiology pharmacist based on pt's variable ranges recently.  Pt has annual cardiology f/u with physician this week. Will route results to provider upon receipt.

## 2018-02-18 ENCOUNTER — Ambulatory Visit (INDEPENDENT_AMBULATORY_CARE_PROVIDER_SITE_OTHER): Payer: PRIVATE HEALTH INSURANCE | Admitting: Pharmacist Clinician (PhC)/ Clinical Pharmacy Specialist

## 2018-02-18 DIAGNOSIS — Z952 Presence of prosthetic heart valve: Secondary | ICD-10-CM

## 2018-02-18 DIAGNOSIS — Z7901 Long term (current) use of anticoagulants: Secondary | ICD-10-CM

## 2018-02-18 LAB — PROTIME-INR
INR: 3.2 — AB (ref 0.8–1.2)
PROTHROMBIN TIME: 30.7 s — AB (ref 9.1–12.0)

## 2018-02-18 NOTE — Progress Notes (Signed)
Reviewed results with pt via phone. Routed to cardiology pharmacist with request to contact pt with any recommendations or questions. No further needs from pt. Will f/u with Pharm encounter note to determine next draw schedule and make appointment with pt.

## 2018-02-20 ENCOUNTER — Encounter: Payer: Self-pay | Admitting: Cardiology

## 2018-02-20 ENCOUNTER — Ambulatory Visit: Payer: PRIVATE HEALTH INSURANCE | Admitting: Cardiology

## 2018-02-20 VITALS — BP 108/65 | HR 59 | Ht 60.0 in | Wt 121.8 lb

## 2018-02-20 DIAGNOSIS — Z7901 Long term (current) use of anticoagulants: Secondary | ICD-10-CM

## 2018-02-20 DIAGNOSIS — E785 Hyperlipidemia, unspecified: Secondary | ICD-10-CM

## 2018-02-20 DIAGNOSIS — Z952 Presence of prosthetic heart valve: Secondary | ICD-10-CM

## 2018-02-20 DIAGNOSIS — I10 Essential (primary) hypertension: Secondary | ICD-10-CM

## 2018-02-20 NOTE — Patient Instructions (Signed)
NO CHANGES WITH CURRENT MEDICATIONS   Your physician wants you to follow-up in: 12 MONTH WITH DR HARDING. You will receive a reminder letter in the mail two months in advance. If you don't receive a letter, please call our office to schedule the follow-up appointment.   If you need a refill on your cardiac medications before your next appointment, please call your pharmacy.  

## 2018-02-20 NOTE — Progress Notes (Signed)
PCP: Lorre Munroe, NP  Clinic Note: Chief Complaint  Patient presents with  . Follow-up    Pt states no Sx  . Cardiac Valve Problem    Status post AVR    HPI: Victoria [name] is a 57 y.o. female with a PMH below who presents today for annual f/u post AoV Relacement.Marland Kitchen of bicuspid aortic valve with severe stenosis status post AVR/Bentall (St. Jude 21 mm Mechanical Valve) with Left Carotid Bypass in January 2011.He is anticoagulated with warfarin.  Victoria [name] initial postoperative echocardiogram showed mild to moderate stenosis with elevated gradients as noted below. This was thought to be potentially related to size mismatch of the graft..  Former Dr. Clarene Duke patient.Victoria [name] was last seen in February 2018.  He was doing well with no complaints.  No symptoms.  Recent Hospitalizations: None  Studies Personally Reviewed - (if available, images/films reviewed: From Epic Chart or Care Everywhere)  Transthoracic Echo January 13, 2018: EF 60-65%.  Mild LVH.  Prosthetic aortic valve opens well.  Peak-mean gradients 36 and 19 mmHg respectively.  Appropriate for prosthesis.  Mild MR.  Trivial AI.  Interval History: Victoria [name] returns today as usual doing well without any no signs of fatigue or dyspnea.  No chest tightness or pressure.  No palpitations or irregular heartbeats.  No chest pain or shortness of breath with rest or exertion. No PND, orthopnea or edema.  No lightheadedness, dizziness, weakness or syncope/near syncope. No TIA/amaurosis fugax symptoms. No melena, hematochezia, hematuria, or epstaxis.  Just mild bruising No claudication.  ROS: A comprehensive was performed. Review of Systems  Constitutional: Negative for chills, fever and malaise/fatigue.  HENT: Negative for congestion.   Respiratory: Negative for cough, shortness of breath and wheezing.   Musculoskeletal: Negative for joint pain.  Neurological: Negative for dizziness and loss of consciousness.    Endo/Heme/Allergies: Negative for environmental allergies.  All other systems reviewed and are negative.   I have reviewed and (if needed) personally updated the patient's problem list, medications, allergies, past medical and surgical history, social and family history.   Past Medical History:  Diagnosis Date  . History of bicuspid aortic valve     with severe AS and dilated aortic root/arch.  . Hyperlipidemia   . Hypertension   . 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 Jude 21 mm mechanical valve, 24 mm IMA shield vascular graft for the ascending aorta/arch), L carotid artery bypassing    Past Surgical History:  Procedure Laterality Date  . ABDOMINAL HYSTERECTOMY  2000   total  . AORTIC VALVE REPLACEMENT  January 2011   St. Jude 21mm mechanical valve, Bentall procesure & L cartoid artery bypassing   . BENTALL PROCEDURE  January 2011   See above  . CARDIAC CATHETERIZATION  December 2010   No significant coronary disease. Normal LV function.  . TRANSTHORACIC ECHOCARDIOGRAM  April 2014   Normal LV size and thickness. EF 65-70%.; Grade 3 diastolic dysfunction with elevated LAP; 21 mm tilting-disc St. Jude valve functioning normally. No pannus. Mild-moderate stenosis with velocities higher than anticipated for valve type. Peak gradient 35-39 mmHg, mean 20-24 mmHg.  Marland Kitchen TRANSTHORACIC ECHOCARDIOGRAM  1/'16, 1/19   a) Normal EF (60-65%), Normal wall motion. 21 mm St Jude Mech AoV Prosthesis- well seated, mild AI (not peri-valvular leak as originally noted). Peak-Mean Gradients: 35 mmHg & 22 mHg (likely size related).  Stable;; b) EF 60-65%.  Mild LVH.  Prosthetic aortic valve opens well.  Peak-mean  gradients 36 and 19 mmHg respectively.  Appropriate for prosthesis.  Mild MR.  Trivial AI.    Current Meds  Medication Sig  . amLODipine (NORVASC) 10 MG tablet TAKE 1 TABLET BY MOUTH ONCE DAILY  . atorvastatin (LIPITOR) 40 MG tablet TAKE 1 TABLET BY MOUTH ONCE DAILY AT BEDTIME  .  carvedilol (COREG) 3.125 MG tablet TAKE 1 TABLET BY MOUTH EVERY 12 HOURS  . ciclopirox (PENLAC) 8 % solution Apply topically at bedtime. Apply over nail and surrounding skin. Apply daily over previous coat.  . warfarin (COUMADIN) 5 MG tablet TAKE 1 1/2 TO 2 TABLETS BY MOUTH ONCE DAILY PER COUMADIN CLINIC    Allergies  Allergen Reactions  . Lisinopril     Social History   Tobacco Use  . Smoking status: Former Smoker    Types: Cigarettes    Last attempt to quit: 01/01/2010    Years since quitting: 8.1  . Smokeless tobacco: Never Used  Substance Use Topics  . Alcohol use: Yes    Alcohol/week: 18.0 oz    Types: 12 Standard drinks or equivalent, 18 Cans of beer per week  . Drug use: No   Social History   Social History Narrative   She is a divorced mother of one, grandmother 1. She usually exercises 4-5 days a week. Very active.   Quit smoking in 2011.   Takes 1-2 beers per day and is stable.    family history includes Breast cancer in Victoria [name] maternal grandmother; Hypertension in Victoria [name] father; Lung cancer in Victoria [name] maternal grandmother; Stroke in Victoria [name] father.  Wt Readings from Last 3 Encounters:  02/20/18 121 lb 12.8 oz (55.2 kg)  06/14/17 118 lb (53.5 kg)  04/18/17 116 lb 8 oz (52.8 kg)    PHYSICAL EXAM BP 108/65   Pulse (!) 59   Ht 5' (1.524 m)   Wt 121 lb 12.8 oz (55.2 kg)   BMI 23.79 kg/m  Physical Exam  Constitutional: She is oriented to person, place, and time. She appears well-developed and well-nourished.  Healthy-appearing.  Pleasant woman in no acute distress.  HENT:  Head: Normocephalic and atraumatic.  Neck: No hepatojugular reflux and no JVD present. Carotid bruit is not present.  Cardiovascular: Normal rate, regular rhythm, S1 normal and intact distal pulses.  No extrasystoles are present. PMI is not displaced. Exam reveals no gallop and no friction rub.  Murmur heard.  Harsh crescendo-decrescendo midsystolic murmur is present with a grade of 3/6 at the upper  right sternal border radiating to the neck. Crisp prosthetic S2.  Pulmonary/Chest: Effort normal and breath sounds normal. No respiratory distress. She has no wheezes. She has no rales.  Abdominal: Soft. Bowel sounds are normal. She exhibits no distension. There is no tenderness. There is no rebound.  Musculoskeletal: Normal range of motion. She exhibits no edema.  Neurological: She is alert and oriented to person, place, and time.  Skin: Skin is warm and dry.  Psychiatric: She has a normal mood and affect. Victoria [name] behavior is normal. Judgment and thought content normal.  Nursing note and vitals reviewed.   Adult ECG Report  Rate: 59;  Rhythm: sinus bradycardia and Normal axis, intervals and durations.;   Narrative Interpretation: Normal EKG.   Other studies Reviewed: Additional studies/ records that were reviewed today include:  Recent Labs:   Lab Results  Component Value Date   CHOL 146 06/14/2017   HDL 70 06/14/2017   LDLCALC 62 06/14/2017   TRIG 70 06/14/2017   CHOLHDL 2.1  06/14/2017    ASSESSMENT / PLAN: Problem List Items Addressed This Visit    Dyslipidemia, goal LDL below 130 (Chronic)    Excellent LDL based on last June results with atorvastatin.  Labs are followed by PCP. No myalgias on atorvastatin.  Would simply continue.      Essential hypertension (Chronic)    Remains on carvedilol and amlodipine.  Well-controlled.      Relevant Orders   EKG 12-Lead   H/O aortic valve replacement - Primary (Chronic)    Aortic valve appears well functioning based on echocardiogram.  Can probably check up a new echocardiogram and may be 2-3 years.  Has baseline gradients that are probably normal for the size of Victoria [name] prosthesis. Continue warfarin. Continue low-dose beta-blocker. Continue SBE prophylaxis as directed.      Relevant Orders   EKG 12-Lead   Long term current use of anticoagulant therapy (Chronic)    Followed by our warfarin clinic.  No bleeding issues.          Current medicines are reviewed at length with the patient today. (+/- concerns) none The following changes have been made: discuss SBE prophylaxis.  Patient Instructions  NO CHANGES WITH CURRENT MEDICATIONS     Your physician wants you to follow-up in 12 MONTH WITH DR Gearl Baratta. You will receive a reminder letter in the mail two months in advance. If you don't receive a letter, please call our office to schedule the follow-up appointment.     If you need a refill on your cardiac medications before your next appointment, please call your pharmacy.     Studies Ordered:   Orders Placed This Encounter  Procedures  . EKG 12-Lead      Bryan Lemma, M.D., M.S. Interventional Cardiologist   Pager # [phone] Phone # [phone] [address] [place], [place] [address]   Thank you for choosing [facility] at [facility]!!

## 2018-02-22 ENCOUNTER — Encounter: Payer: Self-pay | Admitting: Cardiology

## 2018-02-22 NOTE — Assessment & Plan Note (Signed)
Remains on carvedilol and amlodipine.  Well-controlled.

## 2018-02-22 NOTE — Assessment & Plan Note (Signed)
Aortic valve appears well functioning based on echocardiogram.  Can probably check up a new echocardiogram and may be 2-3 years.  Has baseline gradients that are probably normal for the size of her prosthesis. Continue warfarin. Continue low-dose beta-blocker. Continue SBE prophylaxis as directed.

## 2018-02-22 NOTE — Assessment & Plan Note (Signed)
Excellent LDL based on last June results with atorvastatin.  Labs are followed by PCP. No myalgias on atorvastatin.  Would simply continue.

## 2018-02-22 NOTE — Assessment & Plan Note (Signed)
Followed by our warfarin clinic.  No bleeding issues.

## 2018-03-10 ENCOUNTER — Ambulatory Visit: Payer: Self-pay | Admitting: *Deleted

## 2018-03-10 VITALS — BP 137/74 | HR 57

## 2018-03-10 DIAGNOSIS — Z952 Presence of prosthetic heart valve: Secondary | ICD-10-CM

## 2018-03-10 DIAGNOSIS — Z7901 Long term (current) use of anticoagulants: Secondary | ICD-10-CM

## 2018-03-10 NOTE — Progress Notes (Signed)
Labs drawn for 3 week PT/INR check. BP checked.

## 2018-03-11 ENCOUNTER — Ambulatory Visit (INDEPENDENT_AMBULATORY_CARE_PROVIDER_SITE_OTHER): Payer: PRIVATE HEALTH INSURANCE | Admitting: Pharmacist Clinician (PhC)/ Clinical Pharmacy Specialist

## 2018-03-11 DIAGNOSIS — Z7901 Long term (current) use of anticoagulants: Secondary | ICD-10-CM

## 2018-03-11 DIAGNOSIS — Z952 Presence of prosthetic heart valve: Secondary | ICD-10-CM | POA: Diagnosis not present

## 2018-03-11 LAB — PROTIME-INR
INR: 2.5 — AB (ref 0.8–1.2)
Prothrombin Time: 24.7 s — ABNORMAL HIGH (ref 9.1–12.0)

## 2018-03-11 NOTE — Progress Notes (Signed)
No change in dosing per cardiology pharmacist. Repeat INR in 4 weeks. Appt made and sent to pt's email calendar.

## 2018-03-11 NOTE — Progress Notes (Signed)
Pt made aware of result via phone. Results routed to cardiology pharmacist to call pt with recommendations. Will f/u to review pharmacists' notes for next suggested draw date.

## 2018-03-22 ENCOUNTER — Other Ambulatory Visit: Payer: Self-pay | Admitting: Cardiology

## 2018-04-14 ENCOUNTER — Ambulatory Visit: Payer: Self-pay | Admitting: *Deleted

## 2018-04-14 VITALS — BP 117/69 | HR 51

## 2018-04-14 DIAGNOSIS — Z952 Presence of prosthetic heart valve: Secondary | ICD-10-CM

## 2018-04-14 DIAGNOSIS — Z7901 Long term (current) use of anticoagulants: Secondary | ICD-10-CM

## 2018-04-14 NOTE — Progress Notes (Signed)
PT/INR drawn. One week delay due to scheduling conflicts between RN and pt.  [place] going [place] next [place]. Last day at work May 10th, returning to work May 23rd. Pt scheduled to draw INR check on May 27th.

## 2018-04-15 LAB — PROTIME-INR
INR: 2.3 — AB (ref 0.8–1.2)
Prothrombin Time: 22.4 s — ABNORMAL HIGH (ref 9.1–12.0)

## 2018-04-15 NOTE — Progress Notes (Signed)
Results routed to cardiology pharmacist. Subtherapeutic and pt traveling internationally next month over next expected draw date, so cardiology pharmacist made aware of travel dates and RN requested their preferred draw dates of repeat INR based around pt's vacation to ensure appropriate f/u. Pt made aware of level via phone and to expect phone call from pharmacist per usual protocol with recommendations or med changes. Will f/u with pt once recommendations placed in epic.

## 2018-04-16 ENCOUNTER — Ambulatory Visit (INDEPENDENT_AMBULATORY_CARE_PROVIDER_SITE_OTHER): Payer: PRIVATE HEALTH INSURANCE | Admitting: Pharmacist

## 2018-04-16 DIAGNOSIS — Z7901 Long term (current) use of anticoagulants: Secondary | ICD-10-CM

## 2018-04-16 DIAGNOSIS — Z952 Presence of prosthetic heart valve: Secondary | ICD-10-CM

## 2018-04-16 NOTE — Progress Notes (Signed)
Anti-coag encounter from cardiology pharmacist noted in chart. Reads that they spoke with pt and had her increase dose to 10mg  today, up from usual 7.5mg  then continue usual schedule of 7.5mg  daily, except 10mg  on Mondays. Repeat INR on May 10th prior to leaving for vacation. Pt placed on schedule and will email pt upon return to clinic tomorrow with appt time and date for confirmation.

## 2018-04-17 NOTE — Progress Notes (Signed)
Noted  

## 2018-04-29 ENCOUNTER — Other Ambulatory Visit: Payer: Self-pay | Admitting: Cardiology

## 2018-05-02 ENCOUNTER — Ambulatory Visit: Payer: Self-pay | Admitting: *Deleted

## 2018-05-02 VITALS — BP 119/69 | HR 54

## 2018-05-02 DIAGNOSIS — Z952 Presence of prosthetic heart valve: Secondary | ICD-10-CM

## 2018-05-02 DIAGNOSIS — Z7901 Long term (current) use of anticoagulants: Secondary | ICD-10-CM

## 2018-05-03 LAB — PROTIME-INR
INR: 2.6 — ABNORMAL HIGH (ref 0.8–1.2)
Prothrombin Time: 25.7 s — ABNORMAL HIGH (ref 9.1–12.0)

## 2018-05-05 ENCOUNTER — Ambulatory Visit (INDEPENDENT_AMBULATORY_CARE_PROVIDER_SITE_OTHER): Payer: PRIVATE HEALTH INSURANCE | Admitting: Pharmacist

## 2018-05-05 DIAGNOSIS — Z952 Presence of prosthetic heart valve: Secondary | ICD-10-CM | POA: Diagnosis not present

## 2018-05-05 DIAGNOSIS — Z7901 Long term (current) use of anticoagulants: Secondary | ICD-10-CM

## 2018-05-05 NOTE — Progress Notes (Signed)
Chart review shows PharmD Rodriguez-Guzman contacted pt and LVM at 10:40 this am with recommendation to continue current dose and repeat INR in 4 weeks. Pt scheduled for Thursday 05/29/18 and email sent with calendar invite to pt.

## 2018-05-05 NOTE — Progress Notes (Signed)
Victoria [name] contacted via cell [phone]) at her request with results. Expecting phone call from [facility] regarding recommendations today. Pt leaving for international travel tomorrow. Returns in approx 2 weeks. Raquel/Kristin, please let me know when you would like me to schedule Myosha for her next draw. Thank you!

## 2018-05-06 NOTE — Progress Notes (Signed)
Noted next lab draw 05/29/2018

## 2018-05-06 NOTE — Progress Notes (Signed)
Noted  

## 2018-05-29 ENCOUNTER — Ambulatory Visit: Payer: Self-pay | Admitting: *Deleted

## 2018-05-29 VITALS — BP 128/71 | HR 55

## 2018-05-29 DIAGNOSIS — Z952 Presence of prosthetic heart valve: Secondary | ICD-10-CM

## 2018-05-29 DIAGNOSIS — Z7901 Long term (current) use of anticoagulants: Secondary | ICD-10-CM

## 2018-05-29 NOTE — Progress Notes (Signed)
INR drawn under standing order. Results to CC to [facility] pharmacists [place] and Rodriguez-Guzman in order to contact pt with recommendations.

## 2018-05-30 ENCOUNTER — Ambulatory Visit (INDEPENDENT_AMBULATORY_CARE_PROVIDER_SITE_OTHER): Payer: PRIVATE HEALTH INSURANCE | Admitting: Pharmacist

## 2018-05-30 DIAGNOSIS — Z952 Presence of prosthetic heart valve: Secondary | ICD-10-CM

## 2018-05-30 DIAGNOSIS — Z7901 Long term (current) use of anticoagulants: Secondary | ICD-10-CM

## 2018-05-30 LAB — PROTIME-INR
INR: 4.1 — ABNORMAL HIGH (ref 0.8–1.2)
PROTHROMBIN TIME: 39.3 s — AB (ref 9.1–12.0)

## 2018-05-30 NOTE — Progress Notes (Signed)
Pt made aware of result via phone. She reported to RN yesterday at draw appt that she felt she would at least be within goal range due to bruising more easily than she was at last draw. No frank bleeding. Advised to avoid contact sports until recheck. Results were CC'd to [facility] x2. Pt made aware of this and will be looking for their phone call with recommendations per regular routine. No further questions/concerns.

## 2018-05-30 NOTE — Progress Notes (Signed)
noted 

## 2018-06-20 ENCOUNTER — Other Ambulatory Visit: Payer: Self-pay | Admitting: Cardiology

## 2018-07-04 ENCOUNTER — Ambulatory Visit: Payer: Self-pay | Admitting: *Deleted

## 2018-07-04 VITALS — BP 135/82 | HR 61

## 2018-07-04 DIAGNOSIS — Z952 Presence of prosthetic heart valve: Secondary | ICD-10-CM

## 2018-07-04 DIAGNOSIS — Z7901 Long term (current) use of anticoagulants: Secondary | ICD-10-CM

## 2018-07-04 NOTE — Progress Notes (Signed)
PT/INR drawn and BP checked. INR results to [facility] pharmacists for review in order for them to contact pt with recommendations.

## 2018-07-05 LAB — PROTIME-INR
INR: 4.1 — AB (ref 0.8–1.2)
PROTHROMBIN TIME: 39.2 s — AB (ref 9.1–12.0)

## 2018-07-07 ENCOUNTER — Ambulatory Visit (INDEPENDENT_AMBULATORY_CARE_PROVIDER_SITE_OTHER): Payer: PRIVATE HEALTH INSURANCE | Admitting: Pharmacist

## 2018-07-07 DIAGNOSIS — Z7901 Long term (current) use of anticoagulants: Secondary | ICD-10-CM | POA: Diagnosis not present

## 2018-07-07 DIAGNOSIS — Z952 Presence of prosthetic heart valve: Secondary | ICD-10-CM

## 2018-07-07 NOTE — Progress Notes (Signed)
Results reviewed with pt via phone. INR remains elevated above therapeutic goal at 4.1. [facility] Pharmacist Rodriguez-Guzman was CC'd on results in order to contact pt once results were reviewed per regular routine. Pt to contact [facility] office if she has not heard from them by this afternoon prior to close of business.   Pt denies frank bleeding. Reports bruising less easily than she was last month. Continue avoiding contact sports until recheck. No further questions/concerns.

## 2018-07-08 NOTE — Progress Notes (Signed)
Reviewed with pt 7/12. Nursing notes under original encounter. Per chart review. Pharmacist contacted pt yesterday with instructions to decrease daily dose to 1.5 tabs daily and repeat INR in 2 weeks. Appt made with pt for lab draw 07/21/18.

## 2018-07-08 NOTE — Progress Notes (Signed)
noted 

## 2018-07-26 ENCOUNTER — Other Ambulatory Visit: Payer: Self-pay | Admitting: Cardiology

## 2018-07-29 ENCOUNTER — Ambulatory Visit: Payer: Self-pay | Admitting: *Deleted

## 2018-07-29 VITALS — BP 126/72 | HR 60

## 2018-07-29 DIAGNOSIS — Z952 Presence of prosthetic heart valve: Secondary | ICD-10-CM

## 2018-07-29 DIAGNOSIS — Z7901 Long term (current) use of anticoagulants: Secondary | ICD-10-CM

## 2018-07-29 NOTE — Progress Notes (Signed)
PT/INR drawn and BP checked. [facility] results to [facility] pharmacists for review in order for them to contact pt with recommendations. RN not on site tomorrow as clinic closed on Wednesdays. Pt reminded of this and instructed to contact [facility] if she has not heard from office by midafternoon tomorrow. She verbalizes understanding and agreement.

## 2018-07-30 ENCOUNTER — Ambulatory Visit (INDEPENDENT_AMBULATORY_CARE_PROVIDER_SITE_OTHER): Payer: PRIVATE HEALTH INSURANCE | Admitting: Pharmacist Clinician (PhC)/ Clinical Pharmacy Specialist

## 2018-07-30 DIAGNOSIS — Z7901 Long term (current) use of anticoagulants: Secondary | ICD-10-CM

## 2018-07-30 DIAGNOSIS — Z952 Presence of prosthetic heart valve: Secondary | ICD-10-CM

## 2018-07-30 LAB — PROTIME-INR
INR: 2.7 — AB (ref 0.8–1.2)
Prothrombin Time: 25.8 s — ABNORMAL HIGH (ref 9.1–12.0)

## 2018-07-31 NOTE — Progress Notes (Signed)
Mesg left for pt on personal work line that per chart review of pharmacist notes, no dose change and INR within goal range. Repeat in 4 weeks. Appt scheduled and pt made aware of 08/25/18 0900 appt for next INR check. Instructed pt to return call or email with any questions or concerns.

## 2018-08-26 ENCOUNTER — Ambulatory Visit: Payer: Self-pay | Admitting: *Deleted

## 2018-08-26 VITALS — BP 128/75 | HR 57

## 2018-08-26 DIAGNOSIS — Z952 Presence of prosthetic heart valve: Secondary | ICD-10-CM

## 2018-08-26 DIAGNOSIS — Z7901 Long term (current) use of anticoagulants: Secondary | ICD-10-CM

## 2018-08-27 ENCOUNTER — Ambulatory Visit (INDEPENDENT_AMBULATORY_CARE_PROVIDER_SITE_OTHER): Payer: PRIVATE HEALTH INSURANCE | Admitting: Pharmacist Clinician (PhC)/ Clinical Pharmacy Specialist

## 2018-08-27 DIAGNOSIS — Z7901 Long term (current) use of anticoagulants: Secondary | ICD-10-CM | POA: Diagnosis not present

## 2018-08-27 DIAGNOSIS — Z952 Presence of prosthetic heart valve: Secondary | ICD-10-CM | POA: Diagnosis not present

## 2018-08-27 LAB — PROTIME-INR
INR: 2.6 — ABNORMAL HIGH (ref 0.8–1.2)
Prothrombin Time: 25 s — ABNORMAL HIGH (ref 9.1–12.0)

## 2018-08-29 NOTE — Progress Notes (Signed)
Appt set and emailed to pt's calendar for INR recheck 09/22/18.

## 2018-09-22 ENCOUNTER — Ambulatory Visit: Payer: Self-pay | Admitting: *Deleted

## 2018-09-22 VITALS — BP 109/61 | HR 53

## 2018-09-22 DIAGNOSIS — Z7901 Long term (current) use of anticoagulants: Secondary | ICD-10-CM

## 2018-09-22 DIAGNOSIS — Z952 Presence of prosthetic heart valve: Secondary | ICD-10-CM

## 2018-09-23 ENCOUNTER — Ambulatory Visit (INDEPENDENT_AMBULATORY_CARE_PROVIDER_SITE_OTHER): Payer: PRIVATE HEALTH INSURANCE | Admitting: Pharmacist Clinician (PhC)/ Clinical Pharmacy Specialist

## 2018-09-23 DIAGNOSIS — Z7901 Long term (current) use of anticoagulants: Secondary | ICD-10-CM

## 2018-09-23 DIAGNOSIS — Z952 Presence of prosthetic heart valve: Secondary | ICD-10-CM | POA: Diagnosis not present

## 2018-09-23 LAB — PROTIME-INR
INR: 3.6 — ABNORMAL HIGH (ref 0.8–1.2)
Prothrombin Time: 33.5 s — ABNORMAL HIGH (ref 9.1–12.0)

## 2018-09-23 NOTE — Progress Notes (Signed)
Pt informed of INR result of 3.6 via phone. New goal range is 2.0-3.0 as of last month when it was dropped from 2.5-3.5. Pt denies any med changes or change in diet that can account for the increase in level. Denies frank bleeding, increase in bruising, gums bleeding, dark stools, etc. Anticoag clinic pharmacists CC'd on results. Pt advised of this and will look for their phone call with recommendations for dosing, next draw, f/u. She has no further questions/concerns.

## 2018-10-06 LAB — HM MAMMOGRAPHY

## 2018-10-13 ENCOUNTER — Ambulatory Visit: Payer: Self-pay | Admitting: *Deleted

## 2018-10-13 VITALS — BP 106/63 | HR 57

## 2018-10-13 DIAGNOSIS — Z952 Presence of prosthetic heart valve: Secondary | ICD-10-CM

## 2018-10-13 DIAGNOSIS — Z7901 Long term (current) use of anticoagulants: Secondary | ICD-10-CM

## 2018-10-14 ENCOUNTER — Ambulatory Visit (INDEPENDENT_AMBULATORY_CARE_PROVIDER_SITE_OTHER): Payer: PRIVATE HEALTH INSURANCE | Admitting: Pharmacist Clinician (PhC)/ Clinical Pharmacy Specialist

## 2018-10-14 DIAGNOSIS — Z952 Presence of prosthetic heart valve: Secondary | ICD-10-CM

## 2018-10-14 DIAGNOSIS — Z7901 Long term (current) use of anticoagulants: Secondary | ICD-10-CM

## 2018-10-14 LAB — PROTIME-INR
INR: 2.4 — ABNORMAL HIGH (ref 0.8–1.2)
Prothrombin Time: 23.2 s — ABNORMAL HIGH (ref 9.1–12.0)

## 2018-10-14 NOTE — Progress Notes (Signed)
Unable to reach pt via phone or at workstation today as she was with customers. LVM on her private work line informing her of INR level 2.4, within goal range of 2-3 and that cardiology pharmacist had created an encounter but not completed it yet so they should be contacting her soon. Will f/u with chart review upon rtc Thursday for next lab draw date and schedule with pt.

## 2018-10-16 NOTE — Progress Notes (Signed)
Appt made in work calendar and emailed to pt for 11/10/18 for next INR draw and BP check in clinic per cardiology instructions.

## 2018-10-17 ENCOUNTER — Encounter: Payer: Self-pay | Admitting: Internal Medicine

## 2018-11-10 ENCOUNTER — Ambulatory Visit: Payer: Self-pay | Admitting: *Deleted

## 2018-11-10 VITALS — BP 120/71 | HR 56

## 2018-11-10 DIAGNOSIS — Z952 Presence of prosthetic heart valve: Secondary | ICD-10-CM

## 2018-11-10 DIAGNOSIS — Z7901 Long term (current) use of anticoagulants: Secondary | ICD-10-CM

## 2018-11-11 ENCOUNTER — Ambulatory Visit (INDEPENDENT_AMBULATORY_CARE_PROVIDER_SITE_OTHER): Payer: PRIVATE HEALTH INSURANCE | Admitting: Pharmacist Clinician (PhC)/ Clinical Pharmacy Specialist

## 2018-11-11 DIAGNOSIS — Z952 Presence of prosthetic heart valve: Secondary | ICD-10-CM | POA: Diagnosis not present

## 2018-11-11 DIAGNOSIS — Z7901 Long term (current) use of anticoagulants: Secondary | ICD-10-CM | POA: Diagnosis not present

## 2018-11-11 LAB — PROTIME-INR
INR: 3.6 — ABNORMAL HIGH (ref 0.8–1.2)
PROTHROMBIN TIME: 33.7 s — AB (ref 9.1–12.0)

## 2018-11-11 NOTE — Progress Notes (Signed)
Could not reach pt by phone. Chart review shows that cardiology contacted pt regarding elevated result and instructed skip today's dose and repeat level in 3 weeks. Email sent with date/time appt for next INR and BP check per recommendations.

## 2018-11-24 ENCOUNTER — Other Ambulatory Visit: Payer: Self-pay | Admitting: Cardiology

## 2018-12-01 ENCOUNTER — Ambulatory Visit: Payer: Self-pay | Admitting: *Deleted

## 2018-12-01 VITALS — BP 110/67 | HR 57

## 2018-12-01 DIAGNOSIS — Z7901 Long term (current) use of anticoagulants: Secondary | ICD-10-CM

## 2018-12-01 DIAGNOSIS — Z952 Presence of prosthetic heart valve: Secondary | ICD-10-CM

## 2018-12-02 ENCOUNTER — Ambulatory Visit (INDEPENDENT_AMBULATORY_CARE_PROVIDER_SITE_OTHER): Payer: PRIVATE HEALTH INSURANCE | Admitting: Pharmacist Clinician (PhC)/ Clinical Pharmacy Specialist

## 2018-12-02 DIAGNOSIS — Z952 Presence of prosthetic heart valve: Secondary | ICD-10-CM

## 2018-12-02 DIAGNOSIS — Z7901 Long term (current) use of anticoagulants: Secondary | ICD-10-CM

## 2018-12-02 LAB — PROTIME-INR
INR: 2.5 — AB (ref 0.8–1.2)
PROTHROMBIN TIME: 24.4 s — AB (ref 9.1–12.0)

## 2018-12-02 NOTE — Progress Notes (Signed)
Per chart review, pharm instructed pt to hold today's warfarin dose and continue regular dosing schedule tomorrow. Repeat lab in 3 weeks. Scheduled and emailed to pt's work calendar for 12/22/18.

## 2018-12-02 NOTE — Progress Notes (Signed)
INR within goal range of 2.0-3.0. Called pt, no answer. LVM with results on private work line. Instructed to return call if any questions. [facility] pharmacists were CC'd on results. Does not appear they have contacted pt yet. Advised pt to expect their communication as per normal routine.

## 2018-12-08 NOTE — Progress Notes (Signed)
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 VM left on pt's individual work line explaining that it appeared the instructions RN had given pt to hold Tuesday's dose was possibly copied from the previous months' encounter and that the daily calendar with dosing schedule showed no changes in dosing. Instructed pt not to skip any doses and to return RN call if she had any questions or if she had received any different instructions from cardiology. No response as of today.

## 2018-12-09 NOTE — Progress Notes (Signed)
Patient stated she did take dose and did not hold last week when seen in warehouse last Thursday late entry.

## 2018-12-22 ENCOUNTER — Ambulatory Visit: Payer: Self-pay | Admitting: *Deleted

## 2018-12-25 ENCOUNTER — Ambulatory Visit: Payer: Self-pay | Admitting: *Deleted

## 2018-12-25 VITALS — BP 110/66 | HR 55

## 2018-12-25 DIAGNOSIS — Z952 Presence of prosthetic heart valve: Secondary | ICD-10-CM

## 2018-12-25 DIAGNOSIS — Z7901 Long term (current) use of anticoagulants: Secondary | ICD-10-CM

## 2018-12-25 NOTE — Progress Notes (Signed)
Last INR check 12/01/18.

## 2018-12-26 ENCOUNTER — Ambulatory Visit (INDEPENDENT_AMBULATORY_CARE_PROVIDER_SITE_OTHER): Payer: PRIVATE HEALTH INSURANCE | Admitting: Pharmacist Clinician (PhC)/ Clinical Pharmacy Specialist

## 2018-12-26 DIAGNOSIS — Z952 Presence of prosthetic heart valve: Secondary | ICD-10-CM | POA: Diagnosis not present

## 2018-12-26 DIAGNOSIS — Z7901 Long term (current) use of anticoagulants: Secondary | ICD-10-CM

## 2018-12-26 LAB — PROTIME-INR
INR: 2.6 — ABNORMAL HIGH (ref 0.8–1.2)
Prothrombin Time: 24.9 s — ABNORMAL HIGH (ref 9.1–12.0)

## 2018-12-26 NOTE — Progress Notes (Signed)
Per chart review, Cardiology has called pt and LVM. Pt informed via email of results within goal range, no dose changes, and repeat INR in 4 weeks. Appt sent to her calendar for 01/26/2019.

## 2019-01-09 ENCOUNTER — Other Ambulatory Visit: Payer: Self-pay

## 2019-01-10 ENCOUNTER — Other Ambulatory Visit: Payer: Self-pay | Admitting: Cardiology

## 2019-01-26 ENCOUNTER — Ambulatory Visit: Payer: Self-pay | Admitting: *Deleted

## 2019-01-26 VITALS — BP 124/66 | HR 58

## 2019-01-26 DIAGNOSIS — Z7901 Long term (current) use of anticoagulants: Secondary | ICD-10-CM

## 2019-01-26 DIAGNOSIS — Z952 Presence of prosthetic heart valve: Secondary | ICD-10-CM

## 2019-01-26 NOTE — Progress Notes (Signed)
Pt had a large nosebleed yesterday evening. No trauma, no sneezing, coughing, etc. No activity when it started. Lasted 10-15 minutes with bright red blood and clots. No pressure held though. So reviewed proper technique for holding bridge of nose with pressure to help stop bleeding. Pt verbalizes understanding and agreement. Denies any other episodes or sx of bleeding.

## 2019-01-27 ENCOUNTER — Ambulatory Visit (INDEPENDENT_AMBULATORY_CARE_PROVIDER_SITE_OTHER): Payer: PRIVATE HEALTH INSURANCE | Admitting: Pharmacist

## 2019-01-27 DIAGNOSIS — Z7901 Long term (current) use of anticoagulants: Secondary | ICD-10-CM | POA: Diagnosis not present

## 2019-01-27 DIAGNOSIS — Z952 Presence of prosthetic heart valve: Secondary | ICD-10-CM | POA: Diagnosis not present

## 2019-01-27 LAB — PROTIME-INR
INR: 4 — ABNORMAL HIGH (ref 0.8–1.2)
Prothrombin Time: 36.6 s — ABNORMAL HIGH (ref 9.1–12.0)

## 2019-01-28 ENCOUNTER — Ambulatory Visit: Payer: Self-pay

## 2019-01-28 ENCOUNTER — Ambulatory Visit (INDEPENDENT_AMBULATORY_CARE_PROVIDER_SITE_OTHER): Payer: No Typology Code available for payment source

## 2019-01-28 ENCOUNTER — Ambulatory Visit: Payer: No Typology Code available for payment source | Admitting: Internal Medicine

## 2019-01-28 ENCOUNTER — Encounter: Payer: Self-pay | Admitting: Internal Medicine

## 2019-01-28 VITALS — BP 124/68 | HR 58 | Temp 98.0°F | Wt 119.0 lb

## 2019-01-28 DIAGNOSIS — Z7901 Long term (current) use of anticoagulants: Secondary | ICD-10-CM | POA: Diagnosis not present

## 2019-01-28 DIAGNOSIS — R040 Epistaxis: Secondary | ICD-10-CM

## 2019-01-28 DIAGNOSIS — Z952 Presence of prosthetic heart valve: Secondary | ICD-10-CM

## 2019-01-28 LAB — POCT INR: INR: 2.5 (ref 2.0–3.0)

## 2019-01-28 NOTE — Telephone Encounter (Signed)
Mandy,   Would you be able to check her INR or do I need to do this through the lab?

## 2019-01-28 NOTE — Telephone Encounter (Signed)
Patient has appt today at 3pm with Pamala Hurry, NP

## 2019-01-28 NOTE — Telephone Encounter (Signed)
I don't manage her in coumadin clinic but I would happy to do a POC stick on her when she checks in so you have her number during visit.  She did have INR checked yesterday at Cardiology though and she was 4.0.  Interestingly, it doesn't appear that they held her, not sure why, her therapeutic range appears to be 2.0-3.0.    I will plan on checking her when she comes in.

## 2019-01-28 NOTE — Progress Notes (Signed)
Subjective:    Patient ID: Victoria [name], female    DOB: 07/25/1961, 58 y.o.   MRN: [id number]  HPI  Pt presents to the clinic today with c/o a nose bleed. She reports this started yesterday. Her nose bled on and off for 4 hours. This is the second nose bleed this week. She denies any facial trauma.  She is on Coumadin, had her INR checked yesterday and it was 4. She was advised to hold her coumadin yesterday and today which she did, then resume current dose. She has not had any nosebleeds today.  Review of Systems      Past Medical History:  Diagnosis Date  . History of bicuspid aortic valve     with severe AS and dilated aortic root/arch.  . Hyperlipidemia   . Hypertension   . 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 amLODipine (NORVASC) 10 MG tablet TAKE 1 TABLET BY MOUTH ONCE DAILY 90 tablet 1  . atorvastatin (LIPITOR) 40 MG tablet TAKE 1 TABLET (40 MG TOTAL) BY MOUTH AT BEDTIME. 90 tablet 3  . carvedilol (COREG) 3.125 MG tablet TAKE 1 TABLET BY MOUTH EVERY 12 HOURS 180 tablet 2  . warfarin (COUMADIN) 5 MG tablet TAKE 1 1/2 TO 2 TABLETS BY MOUTH ONCE DAILY PER COUMADIN CLINIC 180 tablet 0  . warfarin (COUMADIN) 5 MG tablet TAKE 1 1/2 TO 2 TABLETS BY MOUTH ONCE DAILY PER COUMADIN CLINIC 180 tablet 0   No current facility-administered medications for this visit.     Allergies  Allergen Reactions  . Lisinopril     Family History  Problem Relation Age of Onset  . Stroke Father   . Hypertension Father   . Breast cancer Maternal Grandmother   . Lung cancer Maternal Grandmother     Social History   Socioeconomic History  . Marital status: Significant Other    Spouse name: Not on file  . Number of children: 1  . Years of education: Not on file  . Highest education level: Not  on file  Occupational History    Employer: [employer]  Social Needs  . Financial resource strain: Not on file  . Food insecurity:    Worry: Not on file    Inability: Not on file  . Transportation needs:    Medical: Not on file    Non-medical: Not on file  Tobacco Use  . Smoking status: Former Smoker    Types: Cigarettes    Last attempt to quit: 01/01/2010    Years since quitting: 9.0  . Smokeless tobacco: Never Used  Substance and Sexual Activity  . Alcohol use: Yes    Alcohol/week: 30.0 standard drinks    Types: 12 Standard drinks or equivalent, 18 Cans of beer per week  . Drug use: No  . Sexual activity: Yes    Birth control/protection: Surgical  Lifestyle  . Physical activity:    Days per week: Not on file    Minutes per session: Not on file  . Stress: Not on file  Relationships  . Social connections:    Talks on phone: Not on file    Gets together: Not on file    Attends religious service: Not on file    Active member of club or organization: Not on file  Attends meetings of clubs or organizations: Not on file    Relationship status: Not on file  . Intimate partner violence:    Fear of current or ex partner: Not on file    Emotionally abused: Not on file    Physically abused: Not on file    Forced sexual activity: Not on file  Other Topics Concern  . Not on file  Social History Narrative   She is a divorced mother of one, grandmother 1. She usually exercises 4-5 days a week. Very active.   Quit smoking in 2011.   Takes 1-2 beers per day and is stable.     Constitutional: Denies fever, malaise, fatigue, headache or abrupt weight changes.  HEENT: Pt reports nose bleed. Denies eye pain, eye redness, ear pain, ringing in the ears, wax buildup, runny nose, nasal congestion, or sore throat.  No other specific complaints in a complete review of systems (except as listed in HPI above).  Objective:   Physical Exam   BP 124/68   Pulse (!) 58   Temp 98 F  (36.7 C) (Oral)   Wt 119 lb (54 kg)   SpO2 98%   BMI 23.24 kg/m  Wt Readings from Last 3 Encounters:  01/28/19 119 lb (54 kg)  02/20/18 121 lb 12.8 oz (55.2 kg)  06/14/17 118 lb (53.5 kg)    General: Appears her stated age, well developed, well nourished in NAD. HEENT:Nose: mucosa boggy and moist, turbinates swollen R>L, no active bleeding; Throat/Mouth: Teeth present, mucosa pink and moist, no exudate, lesions or ulcerations noted.  Cardiovascular: Normal rate and rhythm. S1,S2 noted.  Click noted. Pulmonary/Chest: Normal effort and positive vesicular breath sounds. No respiratory distress. No wheezes, rales or ronchi noted.  Neurological: Alert and oriented.    BMET    Component Value Date/Time   NA 140 06/14/2017 0844   K 4.3 06/14/2017 0844   CL 101 06/14/2017 0844   CO2 27 07/06/2011 1147   GLUCOSE 103 (H) 06/14/2017 0844   GLUCOSE 100 (H) 07/06/2011 1147   BUN 14 06/14/2017 0844   CREATININE 0.59 06/14/2017 0844   CALCIUM 9.6 06/14/2017 0844   GFRNONAA 104 06/14/2017 0844   GFRAA 120 06/14/2017 0844    Lipid Panel     Component Value Date/Time   CHOL 146 06/14/2017 0844   TRIG 70 06/14/2017 0844   HDL 70 06/14/2017 0844   CHOLHDL 2.1 06/14/2017 0844   LDLCALC 62 06/14/2017 0844    CBC    Component Value Date/Time   WBC 6.9 06/14/2017 0844   WBC 7.7 07/06/2011 1147   RBC 4.38 06/14/2017 0844   RBC 3.83 (L) 07/06/2011 1147   HGB 12.5 06/14/2017 0844   HCT 39.7 06/14/2017 0844   PLT 238 06/14/2017 0844   MCV 91 06/14/2017 0844   MCH 28.5 06/14/2017 0844   MCH 31.3 07/06/2011 1147   MCHC 31.5 06/14/2017 0844   MCHC 34.5 07/06/2011 1147   RDW 14.2 06/14/2017 0844   LYMPHSABS 1.4 06/14/2017 0844   MONOABS 0.7 07/06/2011 1147   EOSABS 0.1 06/14/2017 0844   BASOSABS 0.0 06/14/2017 0844    Hgb A1C Lab Results  Component Value Date   HGBA1C 5.5 06/14/2017           Assessment & Plan:   Nose Bleed secondary to elevated INR:  INR today  2.5 She will hold Coumadin today as advised Resume therapy tomorrow Have INR checked on Monday Monitor for recurrent bleeding  Return precautions  discussed Webb Silversmith, NP

## 2019-01-28 NOTE — Patient Instructions (Signed)
Nosebleed, Adult  A nosebleed is when blood comes out of the nose. Nosebleeds are common. Usually, they are not a sign of a serious condition.  Nosebleeds can happen if a small blood vessel in your nose starts to bleed or if the lining of your nose (mucous membrane) cracks. They are commonly caused by:   Allergies.   Colds.   Picking your nose.   Blowing your nose too hard.   An injury from sticking an object into your nose or getting hit in the nose.   Dry or cold air.  Less common causes of nosebleeds include:   Toxic fumes.   Something abnormal in the nose or in the air-filled spaces in the bones of the face (sinuses).   Growths in the nose, such as polyps.   Medicines or conditions that cause blood to clot slowly.   Certain illnesses or procedures that irritate or dry out the nasal passages.  Follow these instructions at home:  When you have a nosebleed:     Sit down and tilt your head slightly forward.   Use a clean towel or tissue to pinch your nostrils under the bony part of your nose. After 10 minutes, let go of your nose and see if bleeding starts again. Do not release pressure before that time. If there is still bleeding, repeat the pinching and holding for 10 minutes until the bleeding stops.   Do not place tissues or gauze in the nose to stop bleeding.   Avoid lying down and avoid tilting your head backward. That may make blood collect in the throat and cause gagging or coughing.   Use a nasal spray decongestant to help with a nosebleed as told by your health care provider.   Do not use petroleum jelly or mineral oil in your nose. It can drip into your lungs.  After a nosebleed:   Avoid blowing your nose or sniffing for a number of hours.   Avoid straining, lifting, or bending at the waist for several days. You may resume other normal activities as you are able.   Use saline spray or a humidifier as told by your health care provider.   Aspirinand blood thinners make bleeding more  likely. If you are prescribed these medicines and you suffer from nosebleeds:  ? Ask your health care provider if you should stop taking the medicines or if you should adjust the dose.  ? Do not stop taking medicines that your health care provider has recommended unless told by your health care provider.   If your nosebleed was caused by dry mucous membranes, use over-the-counter saline nasal spray or gel. This will keep the mucous membranes moist and allow them to heal. If you must use a lubricant:  ? Choose one that is water-soluble.  ? Use only as much as you need and use it only as often as needed.  ? Do not lie down until several hours after you use it.  Contact a health care provider if:   You have a fever.   You get nosebleeds often or more often than usual.   You bruise very easily.   You have a nosebleed from having something stuck in your nose.   You have bleeding in your mouth.   You vomit or cough up brown material.   You have a nosebleed after you start a new medicine.  Get help right away if:   You have a nosebleed after a fall or a head injury.     Your nosebleed does not go away after 20 minutes.   You feel dizzy or weak.   You have unusual bleeding from other parts of your body.   You have unusual bruising on other parts of your body.   You become sweaty.   You vomit blood.  This information is not intended to replace advice given to you by your health care provider. Make sure you discuss any questions you have with your health care provider.  Document Released: 09/19/2005 Document Revised: 04/16/2017 Document Reviewed: 06/26/2016  Elsevier Interactive Patient Education  2019 [employer]

## 2019-01-28 NOTE — Telephone Encounter (Signed)
Pt. Reports she has been on Coumadin x 7 years. Has had 2 nose bleeds this week. Had one yesterday that lasted "4 hours - it was a steady flow." No bleeding this morning. States her INR was 4 yesterday and she held her Coumadin yesterday. Appointment made for today with her provider.Instrcuted if nose started bleeding again, to call back. Reason for Disposition . [1] Bleeding recurs 3 or more times in 24 hours AND [2] direct pressure applied correctly  Answer Assessment - Initial Assessment Questions 1. AMOUNT OF BLEEDING: "How bad is the bleeding?" "How much blood was lost?" "Has the bleeding stopped?"   - MILD: needed a couple tissues   - MODERATE: needed many tissues   - SEVERE: large blood clots, soaked many tissues, lasted more than 30 minutes      Moderate 2. ONSET: "When did the nosebleed start?"      Nose bleed yesterday 3. FREQUENCY: "How many nosebleeds have you had in the last 24 hours?"      1 4. RECURRENT SYMPTOMS: "Have there been other recent nosebleeds?" If so, ask: "How long did it take you to stop the bleeding?" "What worked best?"      2 this week 5. CAUSE: "What do you think caused this nosebleed?"     Blood thinner 6. LOCAL FACTORS: "Do you have any cold symptoms?", "Have you been rubbing or picking at your nose?"     No 7. SYSTEMIC FACTORS: "Do you have high blood pressure or any bleeding problems?"     On Coumadin 8. BLOOD THINNERS: "Do you take any blood thinners?" (e.g., coumadin, heparin, aspirin, Plavix)     Yes 9. OTHER SYMPTOMS: "Do you have any other symptoms?" (e.g., lightheadedness)     No 10. PREGNANCY: "Is there any chance you are pregnant?" "When was your last menstrual period?"       No  Protocols used: NOSEBLEED-A-AH

## 2019-01-29 ENCOUNTER — Telehealth: Payer: Self-pay | Admitting: Pharmacist Clinician (PhC)/ Clinical Pharmacy Specialist

## 2019-01-29 NOTE — Telephone Encounter (Signed)
Patient called to report multiple nosebleeds over past week.  Went to MD yesterday, INR was 2.5 in their office.  Patient reports nosebleed re-occurred after leaving.   Lasting at least 10-15 min,  Advised she get some Afrin nasal spray, use twice daily x 2 days only.  She should also refrain from blowing her nose.  Patient aware if bleeding continues she should go to ED for possible packing or cauterization.

## 2019-01-29 NOTE — Patient Instructions (Signed)
Patient in to see R. Baity, Np today for ongoing nosebleeds.  While in office obtained a quick POC INR which was 2.5.  She is in the midst of a hold due to INR on Monday of 4.0.  She will hold again today and then follow cardiology clinic's instructions for dosing.  F/u with cardiology clinic as instructed.

## 2019-02-17 ENCOUNTER — Telehealth: Payer: Self-pay

## 2019-02-17 NOTE — Telephone Encounter (Signed)
Called and left a msg regarding overdue coumadin appt 

## 2019-02-24 ENCOUNTER — Ambulatory Visit: Payer: Self-pay | Admitting: *Deleted

## 2019-02-24 VITALS — BP 119/63 | HR 61

## 2019-02-24 DIAGNOSIS — Z7901 Long term (current) use of anticoagulants: Secondary | ICD-10-CM

## 2019-02-24 DIAGNOSIS — Z952 Presence of prosthetic heart valve: Secondary | ICD-10-CM

## 2019-02-24 NOTE — Progress Notes (Signed)
INR check.  3 nosebleeds since last pcp f/u. 1 per week. No additional f/u with those. Using Afrin when those occur. Reminded that cauterization may be an option and can contact pcp or be seen at UC/ED depending on severity and time of day/week. She verbalizes understanding and agreement.  Clinic closed on Wednesdays when INR will result. Pt made aware that Coumadin clinic is CC'd on result and she should hear from them tomorrow instead of RN.

## 2019-02-25 ENCOUNTER — Ambulatory Visit (INDEPENDENT_AMBULATORY_CARE_PROVIDER_SITE_OTHER)
Payer: No Typology Code available for payment source | Admitting: Pharmacist Clinician (PhC)/ Clinical Pharmacy Specialist

## 2019-02-25 DIAGNOSIS — Z952 Presence of prosthetic heart valve: Secondary | ICD-10-CM

## 2019-02-25 DIAGNOSIS — Z7901 Long term (current) use of anticoagulants: Secondary | ICD-10-CM

## 2019-02-25 LAB — PROTIME-INR
INR: 3.3 — ABNORMAL HIGH (ref 0.8–1.2)
Prothrombin Time: 31.3 s — ABNORMAL HIGH (ref 9.1–12.0)

## 2019-02-26 NOTE — Progress Notes (Signed)
noted 

## 2019-02-26 NOTE — Progress Notes (Signed)
Noted NP and cardiology pharm encounter/notes. Appt made and sent to pt for repeat INR 3/16 at 1100 to result 3/17.

## 2019-03-09 ENCOUNTER — Other Ambulatory Visit: Payer: Self-pay

## 2019-03-09 ENCOUNTER — Ambulatory Visit: Payer: Self-pay | Admitting: *Deleted

## 2019-03-09 VITALS — BP 118/62

## 2019-03-09 DIAGNOSIS — Z952 Presence of prosthetic heart valve: Secondary | ICD-10-CM

## 2019-03-09 DIAGNOSIS — Z7901 Long term (current) use of anticoagulants: Secondary | ICD-10-CM

## 2019-03-09 NOTE — Progress Notes (Signed)
INR drawn per cardiology orders. Anticoag clinic CC'd on results. Will update pt upon receipt of results tomorrow. Pt denies any further nosebleeds or other recent concerns.

## 2019-03-10 ENCOUNTER — Other Ambulatory Visit: Payer: Self-pay | Admitting: Cardiology

## 2019-03-11 LAB — PROTIME-INR

## 2019-03-12 ENCOUNTER — Other Ambulatory Visit: Payer: Self-pay

## 2019-03-12 ENCOUNTER — Ambulatory Visit: Payer: Self-pay | Admitting: *Deleted

## 2019-03-12 DIAGNOSIS — Z7901 Long term (current) use of anticoagulants: Secondary | ICD-10-CM

## 2019-03-12 DIAGNOSIS — Z952 Presence of prosthetic heart valve: Secondary | ICD-10-CM

## 2019-03-12 NOTE — Progress Notes (Signed)
Labs not picked up by lab courier on day of draw. Specimen expired before reaching lab for testing and was discarded. New sample drawn today.

## 2019-03-12 NOTE — Progress Notes (Signed)
noted 

## 2019-03-12 NOTE — Progress Notes (Signed)
INR drawn 3/16 was not picked up by lab courier and expired prior to arriving to lab the next day. Specimen redrawn today.

## 2019-03-13 ENCOUNTER — Ambulatory Visit (INDEPENDENT_AMBULATORY_CARE_PROVIDER_SITE_OTHER): Payer: No Typology Code available for payment source | Admitting: Cardiology

## 2019-03-13 DIAGNOSIS — Z7901 Long term (current) use of anticoagulants: Secondary | ICD-10-CM

## 2019-03-13 DIAGNOSIS — Z952 Presence of prosthetic heart valve: Secondary | ICD-10-CM | POA: Diagnosis not present

## 2019-03-13 LAB — PROTIME-INR
INR: 2.7 — ABNORMAL HIGH (ref 0.8–1.2)
Prothrombin Time: 25.9 s — ABNORMAL HIGH (ref 9.1–12.0)

## 2019-03-13 NOTE — Patient Instructions (Signed)
Description   Spoke with patient and advised to continue dose to 7.5 mg (1.5 tablets) daily except 1 tablet each Sunday.  Repeat INR in 3 weeks.

## 2019-03-13 NOTE — Progress Notes (Signed)
Advised pt of 2.7 result within goal range of 2.0-3.0 and should expect to hear from [facility] anticoag clinic staff by end of the day with any recommendations. Pt verbalizes understanding. No further questions/concerns.

## 2019-03-13 NOTE — Progress Notes (Signed)
noted 

## 2019-03-16 NOTE — Progress Notes (Signed)
noted 

## 2019-03-16 NOTE — Progress Notes (Signed)
Appt made for next draw on 04/02/19.

## 2019-03-17 ENCOUNTER — Other Ambulatory Visit: Payer: Self-pay | Admitting: Cardiology

## 2019-03-17 MED ORDER — AMLODIPINE BESYLATE 10 MG PO TABS: 10.0000 mg | ORAL_TABLET | Freq: Every day | ORAL | 0 refills | Status: DC

## 2019-03-17 MED ORDER — WARFARIN SODIUM 5 MG PO TABS: ORAL_TABLET | ORAL | 0 refills | Status: DC

## 2019-03-17 NOTE — Telephone Encounter (Signed)
Spoke to patient. She states she not sure which medication are need for refill, except she just picked up carvedilol from the pharmacy.  RN reviewed,Patient medication list-  Sent refill --for Amlodipine.  Reschedule for 04/2019.   will forward to CV[facility] -TO REFILL  WARFARIN

## 2019-03-17 NOTE — Telephone Encounter (Signed)
New Message:    Pt cx her 03-19-19 appt, because of the Virus. She wants to know if you would please refill her medicine?

## 2019-03-19 ENCOUNTER — Ambulatory Visit: Payer: Self-pay | Admitting: Cardiology

## 2019-04-02 ENCOUNTER — Ambulatory Visit: Payer: Self-pay | Admitting: *Deleted

## 2019-04-02 ENCOUNTER — Other Ambulatory Visit: Payer: Self-pay

## 2019-04-02 VITALS — BP 146/72 | HR 60

## 2019-04-02 DIAGNOSIS — Z7901 Long term (current) use of anticoagulants: Secondary | ICD-10-CM

## 2019-04-02 DIAGNOSIS — Z952 Presence of prosthetic heart valve: Secondary | ICD-10-CM

## 2019-04-03 ENCOUNTER — Ambulatory Visit (INDEPENDENT_AMBULATORY_CARE_PROVIDER_SITE_OTHER)
Payer: No Typology Code available for payment source | Admitting: Pharmacist Clinician (PhC)/ Clinical Pharmacy Specialist

## 2019-04-03 DIAGNOSIS — Z7901 Long term (current) use of anticoagulants: Secondary | ICD-10-CM | POA: Diagnosis not present

## 2019-04-03 DIAGNOSIS — Z952 Presence of prosthetic heart valve: Secondary | ICD-10-CM | POA: Diagnosis not present

## 2019-04-03 LAB — PROTIME-INR
INR: 1.9 — ABNORMAL HIGH (ref 0.8–1.2)
Prothrombin Time: 19.4 s — ABNORMAL HIGH (ref 9.1–12.0)

## 2019-04-06 NOTE — Progress Notes (Signed)
Noted. Pt scheduled for 5/4 with calendar appointment sent via email.

## 2019-04-07 NOTE — Progress Notes (Signed)
noted 

## 2019-04-11 ENCOUNTER — Other Ambulatory Visit: Payer: Self-pay | Admitting: Cardiology

## 2019-05-13 ENCOUNTER — Ambulatory Visit: Payer: No Typology Code available for payment source | Admitting: Cardiology

## 2019-05-20 ENCOUNTER — Telehealth: Payer: Self-pay | Admitting: *Deleted

## 2019-05-20 NOTE — Telephone Encounter (Signed)
   TELEPHONE CALL NOTE  This patient has been deemed a candidate for follow-up tele-health visit to limit community exposure during the Covid-19 pandemic. I spoke with the patient via phone to discuss instructions. The patient will receive a phone call 2-3 days prior to their E-Visit at which time consent will be verbally confirmed.   A Virtual Office Visit appointment type has been scheduled for 6/1 with harding , with  "TELEPHONE"only    Tobin Chad, RN 05/20/2019 3:27 PM

## 2019-05-21 ENCOUNTER — Ambulatory Visit: Payer: Self-pay | Admitting: *Deleted

## 2019-05-21 ENCOUNTER — Other Ambulatory Visit: Payer: Self-pay

## 2019-05-22 ENCOUNTER — Ambulatory Visit: Payer: Self-pay | Admitting: *Deleted

## 2019-05-22 VITALS — BP 129/77 | HR 62 | Temp 97.7°F | Ht 60.5 in | Wt 117.0 lb

## 2019-05-22 DIAGNOSIS — Z7901 Long term (current) use of anticoagulants: Secondary | ICD-10-CM

## 2019-05-22 DIAGNOSIS — Z952 Presence of prosthetic heart valve: Secondary | ICD-10-CM

## 2019-05-22 DIAGNOSIS — Z0000 Encounter for general adult medical examination without abnormal findings: Secondary | ICD-10-CM

## 2019-05-22 NOTE — Progress Notes (Signed)
Fasting labs and vitals/Ht/Wt ahead of cardiology annual appt. Virtual visit 05/25/2019.

## 2019-05-23 LAB — CMP12+LP+TP+TSH+6AC+CBC/D/PLT
ALT: 19 IU/L (ref 0–32)
AST: 31 IU/L (ref 0–40)
Albumin/Globulin Ratio: 1.8 (ref 1.2–2.2)
Albumin: 4.5 g/dL (ref 3.8–4.9)
Alkaline Phosphatase: 78 IU/L (ref 39–117)
BUN/Creatinine Ratio: 20 (ref 9–23)
BUN: 14 mg/dL (ref 6–24)
Basophils Absolute: 0 10*3/uL (ref 0.0–0.2)
Basos: 0 %
Bilirubin Total: 1.2 mg/dL (ref 0.0–1.2)
Calcium: 9.3 mg/dL (ref 8.7–10.2)
Chloride: 103 mmol/L (ref 96–106)
Chol/HDL Ratio: 2.3 ratio (ref 0.0–4.4)
Cholesterol, Total: 153 mg/dL (ref 100–199)
Creatinine, Ser: 0.71 mg/dL (ref 0.57–1.00)
EOS (ABSOLUTE): 0.2 10*3/uL (ref 0.0–0.4)
Eos: 2 %
Estimated CHD Risk: <0.5 times avg. (ref 0.0–1.0)
Free Thyroxine Index: 1.8 (ref 1.2–4.9)
GFR calc Af Amer: 110 mL/min/{1.73_m2} (ref >59)
GFR calc non Af Amer: 96 mL/min/{1.73_m2} (ref >59)
GGT: 17 IU/L (ref 0–60)
Globulin, Total: 2.5 g/dL (ref 1.5–4.5)
Glucose: 125 mg/dL — ABNORMAL HIGH (ref 65–99)
HDL: 68 mg/dL (ref >39)
Hematocrit: 38.6 % (ref 34.0–46.6)
Hemoglobin: 12.7 g/dL (ref 11.1–15.9)
Immature Grans (Abs): 0 10*3/uL (ref 0.0–0.1)
Immature Granulocytes: 0 %
Iron: 66 ug/dL (ref 27–159)
LDH: 369 IU/L — ABNORMAL HIGH (ref 119–226)
LDL Calculated: 73 mg/dL (ref 0–99)
Lymphocytes Absolute: 1.3 10*3/uL (ref 0.7–3.1)
Lymphs: 19 %
MCH: 30.5 pg (ref 26.6–33.0)
MCHC: 32.9 g/dL (ref 31.5–35.7)
MCV: 93 fL (ref 79–97)
Monocytes Absolute: 0.7 10*3/uL (ref 0.1–0.9)
Monocytes: 10 %
Neutrophils Absolute: 4.8 10*3/uL (ref 1.4–7.0)
Neutrophils: 69 %
Phosphorus: 3.7 mg/dL (ref 3.0–4.3)
Platelets: 139 10*3/uL — ABNORMAL LOW (ref 150–450)
Potassium: 4.3 mmol/L (ref 3.5–5.2)
RBC: 4.17 x10E6/uL (ref 3.77–5.28)
RDW: 13.1 % (ref 11.7–15.4)
Sodium: 141 mmol/L (ref 134–144)
T3 Uptake Ratio: 26 % (ref 24–39)
T4, Total: 6.9 ug/dL (ref 4.5–12.0)
TSH: 1.52 u[IU]/mL (ref 0.450–4.500)
Total Protein: 7 g/dL (ref 6.0–8.5)
Triglycerides: 60 mg/dL (ref 0–149)
Uric Acid: 5.3 mg/dL (ref 2.5–7.1)
VLDL Cholesterol Cal: 12 mg/dL (ref 5–40)
WBC: 7 10*3/uL (ref 3.4–10.8)

## 2019-05-23 LAB — HGB A1C W/O EAG: Hgb A1c MFr Bld: 5.9 % — ABNORMAL HIGH (ref 4.8–5.6)

## 2019-05-23 LAB — PROTIME-INR
INR: 2.3 — ABNORMAL HIGH (ref 0.8–1.2)
Prothrombin Time: 23.7 s — ABNORMAL HIGH (ref 9.1–12.0)

## 2019-05-25 ENCOUNTER — Encounter: Payer: Self-pay | Admitting: Cardiology

## 2019-05-25 ENCOUNTER — Telehealth: Payer: Self-pay | Admitting: *Deleted

## 2019-05-25 ENCOUNTER — Telehealth (INDEPENDENT_AMBULATORY_CARE_PROVIDER_SITE_OTHER): Payer: PRIVATE HEALTH INSURANCE | Admitting: Cardiology

## 2019-05-25 VITALS — BP 129/77 | HR 62 | Ht 60.5 in | Wt 117.0 lb

## 2019-05-25 DIAGNOSIS — Z952 Presence of prosthetic heart valve: Secondary | ICD-10-CM

## 2019-05-25 DIAGNOSIS — Z7901 Long term (current) use of anticoagulants: Secondary | ICD-10-CM

## 2019-05-25 DIAGNOSIS — I10 Essential (primary) hypertension: Secondary | ICD-10-CM

## 2019-05-25 DIAGNOSIS — E785 Hyperlipidemia, unspecified: Secondary | ICD-10-CM

## 2019-05-25 NOTE — Telephone Encounter (Signed)
Spoke to patient - instruction given from 05/25/19 tele-visit - avs summary will be mailed to patient . Patient verbalized understanding.

## 2019-05-25 NOTE — Patient Instructions (Addendum)
Medication Instructions:  none  Tests Ordered: None No orders of the defined types were placed in this encounter.   Medication Instructions:   N/a   If you need a refill on your cardiac medications before your next appointment, please call your pharmacy.   Lab work:  If you have labs (blood work) drawn today and your tests are completely normal, you will receive your results only by: Marland Kitchen MyChart Message (if you have MyChart) OR . A paper copy in the mail If you have any lab test that is abnormal or we need to change your treatment, we will call you to review the results.  Testing/Procedures: n/a  Follow-Up: At [facility], you and your health needs are our priority.  As part of our continuing mission to provide you with exceptional heart care, we have created designated Provider Care Teams.  These Care Teams include your primary Cardiologist (physician) and Advanced Practice Providers (APPs -  Physician Assistants and Nurse Practitioners) who all work together to provide you with the care you need, when you need it. . You will need a follow up appointment in  February 2021.  Please call our office 2 months in advance to schedule this appointment.  You may see Bryan Lemma, MD or one of the following Advanced Practice Providers on your designated Care Team:   . Theodore Demark, PA-C . Joni Reining, DNP, ANP  Any Other Special Instructions Will Be Listed Below (If Applicable).

## 2019-05-25 NOTE — Progress Notes (Signed)
Virtual Visit via Telephone Note   This visit type was conducted due to national recommendations for restrictions regarding the COVID-19 Pandemic (e.g. social distancing) in an effort to limit this patient's exposure and mitigate transmission in our community.  Due to her co-morbid illnesses, this patient is at least at moderate risk for complications without adequate follow up.  This format is felt to be most appropriate for this patient at this time.  The patient did not have access to video technology/had technical difficulties with video requiring transitioning to audio format only (telephone).  All issues noted in this document were discussed and addressed.  No physical exam could be performed with this format.  Please refer to the patient's chart for her  consent to telehealth for [facility].   Patient does not have Video capability - no mobile phone.  Patient has given verbal permission to conduct this visit via virtual appointment and to bill insurance 05/25/2019 5:57 PM     Evaluation Performed:  Follow-up visit  Date:  05/25/2019   ID:  Victoria [name], DOB 04-14-1961, MRN [id number]  Patient Location: Other:  Work  Provider Location: Home  PCP:  Lorre MunroeBaity, Regina W, NP  Cardiologist:  Bryan Lemmaavid , MD  Electrophysiologist:  None   Chief Complaint: Delayed annual follow-up-s/p AVR/Bentall  History of Present Illness:    Victoria [name] is a 58 y.o. 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  She is a former Dr. Julieanne MansonAlfred Little patient & is on chronic warfarin therapy for Mechanical AVR anticoagulation. Her initial postoperative echocardiogram showed mild to moderate stenosis with elevated gradients as noted below. This was thought to be potentially related to size mismatch  of the graft --> stable echocardiogram in January 2019 -gradients consistent with mild stenosis (36 mmHg peak- 19 mmHg mean) with EF 60-65%  Victoria SeminoleLaurie A [name] was last seen in February 2019 following up her echo from January.  She was doing well with no complaints or symptoms.  No chest pain or pressure.  No dyspnea.  No bleeding issues. --> Plan was to reassess echo in 3 years. --> SBE prophylaxis  Interval History:  Victoria [name] is doing very well from a cardiac standpoint.  No active symptoms.  She is relatively active and is not having any cardiac symptoms  Cardiovascular ROS: no chest pain or dyspnea on exertion negative for - edema, irregular heartbeat, orthopnea, palpitations, paroxysmal nocturnal dyspnea, rapid heart rate, shortness of breath or Syncope/near syncope, TIA shows amaurosis fugax.  Melena, hematochezia, or hematuria.  No longer having epistaxis.  No claudication.  Nosebleeds a few months ago - now OK  The patient does not have symptoms concerning for COVID-19 infection (fever, chills, cough, or new shortness of breath).  The patient is practicing social distancing.  ROS:  Please see the history of present illness.    Review of Systems  Constitutional: Negative for malaise/fatigue and weight loss.  HENT: Negative for nosebleeds.   Respiratory: Negative for cough, hemoptysis and shortness of breath.   Gastrointestinal: Negative for blood in stool and melena.  Genitourinary: Negative for hematuria.  Musculoskeletal: Negative for joint pain.  Neurological: Negative for dizziness, focal weakness and headaches.  Endo/Heme/Allergies: Does not bruise/bleed easily.  Psychiatric/Behavioral: Negative.     Past Medical History:  Diagnosis Date  . History of  bicuspid aortic valve     with severe AS and dilated aortic root/arch.  . Hyperlipidemia   . Hypertension   . 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 Jude 21 mm mechanical valve, 24 mm IMA shield vascular graft for the  ascending aorta/arch), L carotid artery bypassing   Past Surgical History:  Procedure Laterality Date  . ABDOMINAL HYSTERECTOMY  2000   total  . AORTIC VALVE REPLACEMENT  January 2011   St. Jude 21mm mechanical valve, Bentall procesure & L cartoid artery bypassing   . BENTALL PROCEDURE  January 2011   See above  . CARDIAC CATHETERIZATION  December 2010   No significant coronary disease. Normal LV function.  . TRANSTHORACIC ECHOCARDIOGRAM  April 2014   Normal LV size and thickness. EF 65-70%.; Grade 3 diastolic dysfunction with elevated LAP; 21 mm tilting-disc St. Jude valve functioning normally. No pannus. Mild-moderate stenosis with velocities higher than anticipated for valve type. Peak gradient 35-39 mmHg, mean 20-24 mmHg.  Marland Kitchen TRANSTHORACIC ECHOCARDIOGRAM  1/'16, 1/19   a) 12/2014: Normal EF (60-65%), Normal wall motion. 21 mm St Jude Mech AoV Prosthesis- well seated, mild AI (not peri-valvular leak as originally noted). Peak-Mean Gradients: 35 mmHg & 22 mHg (likely size related).  Stable;; b) 12/2017: EF 60-65%.  Mild LVH.  Prosthetic aortic valve opens well.  Peak-mean gradients 36 and 19 mmHg respectively.  Appropriate for prosthesis.  Mild MR.  Trivial AI.     No outpatient medications have been marked as taking for the 05/25/19 encounter (Telemedicine) with [name], MD.     Allergies:   Lisinopril   Social History   Tobacco Use  . Smoking status: Former Smoker    Types: Cigarettes    Last attempt to quit: 01/01/2010    Years since quitting: 9.4  . Smokeless tobacco: Never Used  Substance Use Topics  . Alcohol use: Yes    Alcohol/week: 30.0 standard drinks    Types: 12 Standard drinks or equivalent, 18 Cans of beer per week  . Drug use: No     Family Hx: The patient's family history includes Breast cancer in her maternal grandmother; Hypertension in her father; Lung cancer in her maternal grandmother; Stroke in her father.   Prior CV studies:   The following  studies were reviewed today: . No new  Labs/Other Tests and Data Reviewed:    EKG:  No ECG reviewed.  Recent Labs: 05/22/2019: ALT 19; BUN 14; Creatinine, Ser 0.71; Hemoglobin 12.7; Platelets 139; Potassium 4.3; Sodium 141; TSH 1.520   Recent Lipid Panel Lab Results  Component Value Date/Time   CHOL 153 05/22/2019 09:07 AM   TRIG 60 05/22/2019 09:07 AM   HDL 68 05/22/2019 09:07 AM   CHOLHDL 2.3 05/22/2019 09:07 AM   LDLCALC 73 05/22/2019 09:07 AM    Wt Readings from Last 3 Encounters:  05/25/19 117 lb (53.1 kg)  05/22/19 117 lb (53.1 kg)  01/28/19 119 lb (54 kg)     Objective:    Vital Signs:  BP 129/77 (BP Location: Left Arm, Patient Position: Sitting)   Pulse 62   Ht 5' 0.5" (1.537 m)   Wt 117 lb (53.1 kg)   SpO2 98%   BMI 22.47 kg/m   VITAL SIGNS:  reviewed GEN:  no acute distress RESPIRATORY:  Nonlabored NEURO:  Alert and oriented x3.  Answers questions appropriately PSYCH:  normal affect   ASSESSMENT & PLAN:    Problem List Items Addressed This Visit  Dyslipidemia, goal LDL below 130 (Chronic)   Essential hypertension (Chronic)   H/O aortic valve replacement - Primary (Chronic)   Long term current use of anticoagulant therapy (Chronic)      -- LAURIAN MANCHEGO is doing very well overall from a cardiac standpoint.  Blood pressures well controlled as are her lipids on current medicines. She is on amlodipine and carvedilol for blood pressure, and stable dose of atorvastatin for lipids.  Is on warfarin for anticoagulation with no bleeding issues for her aortic valve.  No concerning signs or symptoms of cardiac disease.  Plan to probably check 2D echocardiogram in roughly 2 to 3 years from now. (Has stable mild stenosis)   COVID-19 Education: The signs and symptoms of COVID-19 were discussed with the patient and how to seek care for testing (follow up with PCP or arrange E-visit).   The importance of social distancing was discussed today.  Time:    Today, I have spent 8 minutes with the patient with telehealth technology discussing the above problems.     Medication Adjustments/Labs and Tests Ordered: Current medicines are reviewed at length with the patient today.  Concerns regarding medicines are outlined above.  Medication Instructions:  none  Tests Ordered: No orders of the defined types were placed in this encounter.   Medication Changes: No orders of the defined types were placed in this encounter.   Disposition:  Follow up In February 2021    Signed, Bryan Lemma, MD  05/25/2019 5:57 PM    [facility]

## 2019-06-04 ENCOUNTER — Telehealth: Payer: Self-pay

## 2019-06-04 DIAGNOSIS — Z7901 Long term (current) use of anticoagulants: Secondary | ICD-10-CM

## 2019-06-04 NOTE — Telephone Encounter (Signed)
Called to let the pt know that they are overdue for their coumadin check and they stated they will do it that their work and I placed orders

## 2019-06-08 ENCOUNTER — Ambulatory Visit: Payer: Self-pay | Admitting: Pharmacist Clinician (PhC)/ Clinical Pharmacy Specialist

## 2019-06-08 DIAGNOSIS — Z952 Presence of prosthetic heart valve: Secondary | ICD-10-CM

## 2019-06-08 DIAGNOSIS — Z7901 Long term (current) use of anticoagulants: Secondary | ICD-10-CM

## 2019-06-14 ENCOUNTER — Other Ambulatory Visit: Payer: Self-pay | Admitting: Cardiology

## 2019-06-15 ENCOUNTER — Other Ambulatory Visit: Payer: Self-pay | Admitting: Cardiology

## 2019-07-08 ENCOUNTER — Telehealth: Payer: Self-pay

## 2019-07-08 NOTE — Telephone Encounter (Signed)
lmom for overdue inr 

## 2019-07-14 ENCOUNTER — Other Ambulatory Visit: Payer: Self-pay

## 2019-07-14 ENCOUNTER — Ambulatory Visit: Payer: Self-pay | Admitting: *Deleted

## 2019-07-14 VITALS — BP 122/72 | HR 65

## 2019-07-14 DIAGNOSIS — Z7901 Long term (current) use of anticoagulants: Secondary | ICD-10-CM

## 2019-07-14 DIAGNOSIS — Z952 Presence of prosthetic heart valve: Secondary | ICD-10-CM

## 2019-07-14 NOTE — Progress Notes (Signed)
Monthly PT/INR drawn. BP check as well. [place] clinic on results. Advised to expect contact from [place] clinic tomorrow prior to typical contact from [place] as [facility] clinic is closed on Wednesdays.

## 2019-07-15 ENCOUNTER — Ambulatory Visit (INDEPENDENT_AMBULATORY_CARE_PROVIDER_SITE_OTHER): Payer: PRIVATE HEALTH INSURANCE | Admitting: Pharmacist

## 2019-07-15 DIAGNOSIS — Z7901 Long term (current) use of anticoagulants: Secondary | ICD-10-CM | POA: Diagnosis not present

## 2019-07-15 DIAGNOSIS — Z952 Presence of prosthetic heart valve: Secondary | ICD-10-CM | POA: Diagnosis not present

## 2019-07-15 LAB — PROTIME-INR
INR: 2.6 — ABNORMAL HIGH (ref 0.8–1.2)
Prothrombin Time: 26.4 s — ABNORMAL HIGH (ref 9.1–12.0)

## 2019-07-16 NOTE — Progress Notes (Signed)
noted 

## 2019-07-16 NOTE — Progress Notes (Signed)
Appt scheduled and sent to pt via work email.

## 2019-07-19 ENCOUNTER — Other Ambulatory Visit: Payer: Self-pay | Admitting: Cardiology

## 2019-08-11 ENCOUNTER — Other Ambulatory Visit: Payer: Self-pay

## 2019-08-11 ENCOUNTER — Ambulatory Visit: Payer: Self-pay | Admitting: *Deleted

## 2019-08-11 VITALS — BP 131/69 | HR 57

## 2019-08-11 DIAGNOSIS — Z7901 Long term (current) use of anticoagulants: Secondary | ICD-10-CM

## 2019-08-11 DIAGNOSIS — Z952 Presence of prosthetic heart valve: Secondary | ICD-10-CM

## 2019-08-11 NOTE — Progress Notes (Signed)
PT/INR draw and BP check per cardiology orders. [place] [facility] [place] on results.

## 2019-08-12 ENCOUNTER — Ambulatory Visit (INDEPENDENT_AMBULATORY_CARE_PROVIDER_SITE_OTHER): Payer: PRIVATE HEALTH INSURANCE | Admitting: Pharmacist

## 2019-08-12 DIAGNOSIS — Z952 Presence of prosthetic heart valve: Secondary | ICD-10-CM

## 2019-08-12 DIAGNOSIS — Z7901 Long term (current) use of anticoagulants: Secondary | ICD-10-CM | POA: Diagnosis not present

## 2019-08-12 LAB — PROTIME-INR
INR: 2.1 — ABNORMAL HIGH (ref 0.8–1.2)
Prothrombin Time: 22.1 s — ABNORMAL HIGH (ref 9.1–12.0)

## 2019-08-13 NOTE — Progress Notes (Signed)
Noted  

## 2019-08-13 NOTE — Progress Notes (Signed)
Notes in by [place] clinic pharmacist. They spoke with pt yesterday and confirmed no change in dosing. Return in 4 weeks for recheck. Appt made and sent to pt via email for 9/14 at 9:30.

## 2019-08-18 NOTE — Progress Notes (Signed)
Labs drawn 08/11/2019 follow up

## 2019-08-18 NOTE — Progress Notes (Signed)
Discussed results with patient on 20 Aug in person.  She verified she received lab draw appt via email from [employer] and is continuing her medication dosing as per pharmacist from anticoagulation clinic.  Late Entry.

## 2019-09-08 ENCOUNTER — Ambulatory Visit: Payer: Self-pay | Admitting: *Deleted

## 2019-09-08 ENCOUNTER — Other Ambulatory Visit: Payer: Self-pay

## 2019-09-08 VITALS — BP 122/67 | HR 54

## 2019-09-08 DIAGNOSIS — Z952 Presence of prosthetic heart valve: Secondary | ICD-10-CM

## 2019-09-08 DIAGNOSIS — Z7901 Long term (current) use of anticoagulants: Secondary | ICD-10-CM

## 2019-09-09 ENCOUNTER — Ambulatory Visit (INDEPENDENT_AMBULATORY_CARE_PROVIDER_SITE_OTHER): Payer: PRIVATE HEALTH INSURANCE | Admitting: Pharmacist

## 2019-09-09 DIAGNOSIS — Z952 Presence of prosthetic heart valve: Secondary | ICD-10-CM

## 2019-09-09 DIAGNOSIS — Z7901 Long term (current) use of anticoagulants: Secondary | ICD-10-CM

## 2019-09-09 LAB — PROTIME-INR
INR: 2.9 — ABNORMAL HIGH (ref 0.8–1.2)
Prothrombin Time: 29.9 s — ABNORMAL HIGH (ref 9.1–12.0)

## 2019-09-10 NOTE — Progress Notes (Signed)
noted 

## 2019-09-10 NOTE — Progress Notes (Signed)
Per chart review, anticoag clinic spoke with pt over phone yesterday and confirmed continued unchanged dosing schedule. Next draw appt for 0930 Mon Oct 12th sent to pt via email/work calendar.

## 2019-10-06 ENCOUNTER — Ambulatory Visit: Payer: Self-pay | Admitting: *Deleted

## 2019-10-06 ENCOUNTER — Other Ambulatory Visit: Payer: Self-pay

## 2019-10-06 VITALS — BP 118/69 | HR 60

## 2019-10-06 DIAGNOSIS — Z952 Presence of prosthetic heart valve: Secondary | ICD-10-CM

## 2019-10-06 DIAGNOSIS — Z7901 Long term (current) use of anticoagulants: Secondary | ICD-10-CM

## 2019-10-07 ENCOUNTER — Ambulatory Visit: Payer: Self-pay | Admitting: Pharmacist

## 2019-10-07 DIAGNOSIS — Z7901 Long term (current) use of anticoagulants: Secondary | ICD-10-CM

## 2019-10-07 DIAGNOSIS — Z952 Presence of prosthetic heart valve: Secondary | ICD-10-CM

## 2019-10-07 LAB — PROTIME-INR
INR: 2.9 — ABNORMAL HIGH (ref 0.9–1.2)
Prothrombin Time: 29.4 s — ABNORMAL HIGH (ref 9.1–12.0)

## 2019-10-08 NOTE — Progress Notes (Signed)
noted 

## 2019-10-08 NOTE — Progress Notes (Signed)
LVM for pt on private work line. Appears anticoag clinic contacted pt yesterday with results and next draw date. Emailed calendar invite for next draw on 11/02/19 at 0930.

## 2019-11-03 ENCOUNTER — Ambulatory Visit: Payer: Self-pay | Admitting: *Deleted

## 2019-11-03 ENCOUNTER — Other Ambulatory Visit: Payer: Self-pay

## 2019-11-03 VITALS — BP 112/63 | HR 53

## 2019-11-03 DIAGNOSIS — Z7901 Long term (current) use of anticoagulants: Secondary | ICD-10-CM

## 2019-11-03 DIAGNOSIS — Z23 Encounter for immunization: Secondary | ICD-10-CM

## 2019-11-03 DIAGNOSIS — Z952 Presence of prosthetic heart valve: Secondary | ICD-10-CM

## 2019-11-03 LAB — POCT INR: INR: 2.2 (ref 2.0–3.0)

## 2019-11-03 NOTE — Progress Notes (Signed)
INR and BP check per cardiology. [place] [facility] [place] on results. Flu vaccine administered per pt request.

## 2019-11-04 ENCOUNTER — Ambulatory Visit (INDEPENDENT_AMBULATORY_CARE_PROVIDER_SITE_OTHER): Payer: PRIVATE HEALTH INSURANCE | Admitting: Cardiovascular Disease

## 2019-11-04 DIAGNOSIS — Z952 Presence of prosthetic heart valve: Secondary | ICD-10-CM

## 2019-11-04 DIAGNOSIS — Z7901 Long term (current) use of anticoagulants: Secondary | ICD-10-CM

## 2019-11-04 LAB — PROTIME-INR
INR: 2.2 — ABNORMAL HIGH (ref 0.9–1.2)
Prothrombin Time: 22.7 s — ABNORMAL HIGH (ref 9.1–12.0)

## 2019-11-05 NOTE — Progress Notes (Signed)
Per anti coag clinic MD note, no changes to medication regimen. Repeat INR in 4 weeks. Appt made and calender reminder sent to pt for 11/30/19 at 0930.

## 2019-11-05 NOTE — Progress Notes (Signed)
noted 

## 2019-12-01 ENCOUNTER — Other Ambulatory Visit: Payer: Self-pay

## 2019-12-01 ENCOUNTER — Ambulatory Visit: Payer: Self-pay | Admitting: *Deleted

## 2019-12-01 VITALS — BP 117/74 | HR 64

## 2019-12-01 DIAGNOSIS — Z7901 Long term (current) use of anticoagulants: Secondary | ICD-10-CM

## 2019-12-01 DIAGNOSIS — Z952 Presence of prosthetic heart valve: Secondary | ICD-10-CM

## 2019-12-02 ENCOUNTER — Ambulatory Visit (INDEPENDENT_AMBULATORY_CARE_PROVIDER_SITE_OTHER): Payer: PRIVATE HEALTH INSURANCE | Admitting: Pharmacist Clinician (PhC)/ Clinical Pharmacy Specialist

## 2019-12-02 DIAGNOSIS — Z952 Presence of prosthetic heart valve: Secondary | ICD-10-CM

## 2019-12-02 DIAGNOSIS — Z7901 Long term (current) use of anticoagulants: Secondary | ICD-10-CM

## 2019-12-02 LAB — PROTIME-INR
INR: 1.7 — ABNORMAL HIGH (ref 0.9–1.2)
Prothrombin Time: 18.3 s — ABNORMAL HIGH (ref 9.1–12.0)

## 2019-12-03 NOTE — Progress Notes (Signed)
Per chart review, anticoag clinic spoke with pt yesterday 12/9 and instructed her to take 2 tablets that day, then resume her normal dosing and recheck in 3 weeks. Calendar invite with appt date and time sent to pt. Scheduled 12/28 at 0930.

## 2019-12-13 ENCOUNTER — Other Ambulatory Visit: Payer: Self-pay | Admitting: Cardiology

## 2019-12-14 NOTE — Telephone Encounter (Signed)
Refill Request.  

## 2019-12-22 ENCOUNTER — Ambulatory Visit: Payer: Self-pay | Admitting: *Deleted

## 2019-12-22 ENCOUNTER — Other Ambulatory Visit: Payer: Self-pay

## 2019-12-22 VITALS — BP 108/65 | HR 61

## 2019-12-22 DIAGNOSIS — Z952 Presence of prosthetic heart valve: Secondary | ICD-10-CM

## 2019-12-22 DIAGNOSIS — Z7901 Long term (current) use of anticoagulants: Secondary | ICD-10-CM

## 2019-12-22 NOTE — Progress Notes (Signed)
INR and BP check per cardiology for warfarin management.

## 2019-12-23 ENCOUNTER — Ambulatory Visit (INDEPENDENT_AMBULATORY_CARE_PROVIDER_SITE_OTHER): Payer: PRIVATE HEALTH INSURANCE | Admitting: Pharmacist

## 2019-12-23 DIAGNOSIS — Z952 Presence of prosthetic heart valve: Secondary | ICD-10-CM | POA: Diagnosis not present

## 2019-12-23 DIAGNOSIS — Z7901 Long term (current) use of anticoagulants: Secondary | ICD-10-CM | POA: Diagnosis not present

## 2019-12-23 LAB — PROTIME-INR
INR: 2.3 — ABNORMAL HIGH (ref 0.9–1.2)
Prothrombin Time: 23.7 s — ABNORMAL HIGH (ref 9.1–12.0)

## 2019-12-24 NOTE — Progress Notes (Signed)
noted 

## 2019-12-24 NOTE — Progress Notes (Signed)
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 Pt to contact clinic with any questions/concerns.

## 2020-01-02 ENCOUNTER — Other Ambulatory Visit: Payer: Self-pay | Admitting: Cardiology

## 2020-01-18 ENCOUNTER — Other Ambulatory Visit: Payer: Self-pay

## 2020-01-18 ENCOUNTER — Ambulatory Visit: Payer: Self-pay | Admitting: *Deleted

## 2020-01-18 VITALS — BP 123/73 | HR 55

## 2020-01-18 DIAGNOSIS — Z7901 Long term (current) use of anticoagulants: Secondary | ICD-10-CM

## 2020-01-18 DIAGNOSIS — Z952 Presence of prosthetic heart valve: Secondary | ICD-10-CM

## 2020-01-18 NOTE — Progress Notes (Signed)
INR and BP check per standing orders. CC'd anticoag clinic on results so they can contact pt with recommendations.

## 2020-01-19 ENCOUNTER — Ambulatory Visit (INDEPENDENT_AMBULATORY_CARE_PROVIDER_SITE_OTHER): Payer: PRIVATE HEALTH INSURANCE | Admitting: Pharmacist

## 2020-01-19 DIAGNOSIS — Z7901 Long term (current) use of anticoagulants: Secondary | ICD-10-CM | POA: Diagnosis not present

## 2020-01-19 DIAGNOSIS — Z952 Presence of prosthetic heart valve: Secondary | ICD-10-CM | POA: Diagnosis not present

## 2020-01-19 LAB — PROTIME-INR
INR: 2.6 — ABNORMAL HIGH (ref 0.9–1.2)
Prothrombin Time: 27.1 s — ABNORMAL HIGH (ref 9.1–12.0)

## 2020-01-19 NOTE — Progress Notes (Signed)
Patient seen at workcenter stated she is to have labs redrawn in 4 weeks and her anticoagulation pharmacist had discussed results with her.  No further questions at this time

## 2020-01-19 NOTE — Progress Notes (Signed)
noted 

## 2020-01-19 NOTE — Progress Notes (Signed)
Unable to contact pt by phone. Email sent providing INR result and anticoag clinic recommendations. Per [facility] review they have already contacted pt today as well. No changes to dosing. Repeat in 4 weeks. Calendar invite sent for 02/15/20.

## 2020-01-19 NOTE — Progress Notes (Signed)
Please forward to pharmacist anticoagulation clinic.  Per last anticoagulation clinic note INR goal 2-3.  Patient continues in goal.  Patient to follow up with anticoagulation clinic staff to verify no dose change and when next labs due.

## 2020-02-10 ENCOUNTER — Other Ambulatory Visit: Payer: Self-pay

## 2020-02-10 ENCOUNTER — Encounter (INDEPENDENT_AMBULATORY_CARE_PROVIDER_SITE_OTHER): Payer: Self-pay

## 2020-02-10 ENCOUNTER — Encounter: Payer: Self-pay | Admitting: Cardiology

## 2020-02-10 ENCOUNTER — Ambulatory Visit: Payer: PRIVATE HEALTH INSURANCE | Admitting: Cardiology

## 2020-02-10 VITALS — BP 114/58 | HR 55 | Temp 94.6°F | Ht 60.0 in | Wt 125.0 lb

## 2020-02-10 DIAGNOSIS — Z7901 Long term (current) use of anticoagulants: Secondary | ICD-10-CM | POA: Diagnosis not present

## 2020-02-10 DIAGNOSIS — I10 Essential (primary) hypertension: Secondary | ICD-10-CM

## 2020-02-10 DIAGNOSIS — E785 Hyperlipidemia, unspecified: Secondary | ICD-10-CM

## 2020-02-10 DIAGNOSIS — Z952 Presence of prosthetic heart valve: Secondary | ICD-10-CM

## 2020-02-10 NOTE — Progress Notes (Signed)
Primary Care Provider: Jearld Fenton, NP Cardiologist: Glenetta Hew, MD Electrophysiologist: None  Clinic Note: Chief Complaint  Patient presents with  . Edema    8 months.  . Cardiac Valve Problem    History of AVR    HPI:    Victoria [name] is a 59 y.o. 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    Victoria [name] was last seen via telemedicine back in June 2020.  She was doing well with no symptoms.  No changes made.  Recent Hospitalizations: None  Reviewed  CV studies:    The following studies were reviewed today: (if available, images/films reviewed: From Epic Chart or Care Everywhere) . None:   Interval History:   Victoria [name] returns again doing extremely well.  She has had no symptoms whatsoever to suggest any arrhythmias or heart failure.  She has not had to have any invasive procedures take prophylactic antibiotics.  She is active with no major complaints.  She is recently took up tennis, and is slowly progressing through her technique.  CV Review of Symptoms (Summary): no chest pain or dyspnea on exertion negative for - edema, irregular heartbeat, orthopnea, palpitations, paroxysmal nocturnal dyspnea, rapid heart rate, shortness of breath or Syncope/near syncope, TIA/amaurosis fugax, claudication.  No bleeding issues.  The patient does not have symptoms concerning for COVID-19 infection (fever, chills, cough, or new shortness of breath).  The patient is not practicing social distancing & Masking.    REVIEWED OF SYSTEMS   Review of Systems  Constitutional: Negative for malaise/fatigue.  HENT: Negative for congestion and nosebleeds.   Gastrointestinal: Negative for blood in stool and melena.    Genitourinary: Negative for hematuria.  Musculoskeletal: Negative for joint pain.  Neurological: Negative for dizziness.   I have reviewed and (if needed) personally updated the patient's problem list, medications, allergies, past medical and surgical history, social and family history.   PAST MEDICAL HISTORY   Past Medical History:  Diagnosis Date  . History of bicuspid aortic valve     with severe AS and dilated aortic root/arch.  . Hyperlipidemia   . Hypertension   . 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 Jude 21 mm mechanical valve, 24 mm IMA shield vascular graft for the ascending aorta/arch), L carotid artery bypassing    PAST SURGICAL HISTORY   Past Surgical History:  Procedure Laterality Date  . ABDOMINAL HYSTERECTOMY  2000   total  . AORTIC VALVE REPLACEMENT  January 2011   St. Jude 9mm mechanical valve, Bentall procesure & L cartoid artery bypassing   . BENTALL PROCEDURE  January 2011   See above  . CARDIAC CATHETERIZATION  December 2010   No significant coronary disease. Normal LV function.  . TRANSTHORACIC ECHOCARDIOGRAM  April 2014   Normal LV size and thickness. EF 65-70%.; Grade 3 diastolic dysfunction with elevated LAP; 21 mm tilting-disc St. Jude valve functioning normally. No pannus. Mild-moderate stenosis with velocities higher than anticipated for valve type. Peak gradient 35-39 mmHg, mean 20-24 mmHg.  Marland Kitchen TRANSTHORACIC ECHOCARDIOGRAM  1/'16, 1/19   a) 12/2014: Normal EF (60-65%), Normal wall motion. 21 mm St Jude Mech AoV Prosthesis- well seated, mild AI (not peri-valvular leak as originally noted). Peak-Mean Gradients: 35 mmHg &  22 mHg (likely size related).  Stable;; b) 12/2017: EF 60-65%.  Mild LVH.  Prosthetic aortic valve opens well.  Peak-mean gradients 36 and 19 mmHg respectively.  Appropriate for prosthesis.  Mild MR.  Trivial AI.    MEDICATIONS/ALLERGIES   Current Meds  Medication Sig  . amLODipine (NORVASC) 10 MG tablet TAKE 1 TABLET BY  MOUTH EVERY DAY  . atorvastatin (LIPITOR) 40 MG tablet TAKE 1 TABLET BY MOUTH EVERYDAY AT BEDTIME  . carvedilol (COREG) 3.125 MG tablet TAKE 1 TABLET BY MOUTH EVERY 12 HOURS  . warfarin (COUMADIN) 5 MG tablet TAKE 1 TO 1 & 1/2 TABLETS BY MOUTH DAILY AS DIRECTED BY COUMADIN CLINIC    Allergies  Allergen Reactions  . Lisinopril     SOCIAL HISTORY/FAMILY HISTORY   Reviewed in Epic:  Pertinent findings: none  OBJCTIVE -PE, EKG, labs   Wt Readings from Last 3 Encounters:  02/10/20 125 lb (56.7 kg)  05/25/19 117 lb (53.1 kg)  05/22/19 117 lb (53.1 kg)    Physical Exam: BP (!) 114/58 (BP Location: Left Arm, Patient Position: Sitting, Cuff Size: Normal)   Pulse (!) 55   Temp (!) 94.6 F (34.8 C)   Ht 5' (1.524 m)   Wt 125 lb (56.7 kg)   BMI 24.41 kg/m  Physical Exam  Constitutional: She is oriented to person, place, and time. She appears well-developed and well-nourished. No distress.  Healthy-appearing.  Well-groomed.  HENT:  Head: Normocephalic and atraumatic.  Neck: No hepatojugular reflux and no JVD present. Carotid bruit is not present.  Cardiovascular: Regular rhythm and intact distal pulses.  No extrasystoles are present. Bradycardia present. PMI is not displaced.  Murmur heard.  Harsh crescendo-decrescendo midsystolic murmur is present with a grade of 2/6 at the upper right sternal border radiating to the neck. Normal S1 with metallic S2.  Pulmonary/Chest: Effort normal and breath sounds normal. No respiratory distress. She has no wheezes. She has no rales.  Musculoskeletal:        General: No edema. Normal range of motion.     Cervical back: Normal range of motion and neck supple.  Neurological: She is alert and oriented to person, place, and time.  Psychiatric: She has a normal mood and affect. Her behavior is normal. Judgment and thought content normal.  Vitals reviewed.     Adult ECG Report  Rate: 55 ;  Rhythm: sinus bradycardia and otherwise normal intervals  & durations, asix.   ;   Narrative Interpretation: stable  Recent Labs:   Lab Results  Component Value Date   CHOL 153 05/22/2019   HDL 68 05/22/2019   LDLCALC 73 05/22/2019   TRIG 60 05/22/2019   CHOLHDL 2.3 05/22/2019   Lab Results  Component Value Date   CREATININE 0.71 05/22/2019   BUN 14 05/22/2019   NA 141 05/22/2019   K 4.3 05/22/2019   CL 103 05/22/2019   CO2 27 07/06/2011   Lab Results  Component Value Date   TSH 1.520 05/22/2019    ASSESSMENT/PLAN    Problem List Items Addressed This Visit    H/O aortic valve replacement - Primary (Chronic)    Most recent echo was in 2019.  Will be due for echo roughly time next visit.  Relatively normal baseline gradients.  Remains on warfarin with no bleeding issues. Continue discussed SBE prophylaxis requirement. On low-dose beta-blocker. Plan: Recheck echo prior to next visit.      Relevant Orders   EKG 12-Lead (Completed)   ECHOCARDIOGRAM  COMPLETE   Essential hypertension (Chronic)    Blood pressure well controlled on low-dose carvedilol plus amlodipine. Continue current meds.      Relevant Orders   EKG 12-Lead (Completed)   ECHOCARDIOGRAM COMPLETE   Dyslipidemia, goal LDL below 130 (Chronic)    Remains on moderate dose atorvastatin.  Lipids have been well controlled.  Last checked in May 2020.  Labs followed by PCP.  Continue to follow.      Long term current use of anticoagulant therapy (Chronic)    On warfarin, monitored by CVRR (Cardiovascular Risk Reduction) Coumadin clinic.          COVID-19 Education: The signs and symptoms of COVID-19 were discussed with the patient and how to seek care for testing (follow up with PCP or arrange E-visit).   The importance of social distancing was discussed today.  I spent a total of with the patient. >  50% of the time was spent in direct patient consultation.  Additional time spent with chart review  / charting (studies, outside notes, etc): 5 Total  Time: 19 min   Current medicines are reviewed at length with the patient today.  (+/- concerns) none   Patient Instructions / Medication Changes & Studies & Tests Ordered   Patient Instructions  Medication Instructions:  No changes *If you need a refill on your cardiac medications before your next appointment, please call your pharmacy*  Lab Work: Not needed  Testing/Procedures: Will be schedule at [facility] [address] in  Jan 2022 Your physician has requested that you have an echocardiogram. Echocardiography is a painless test that uses sound waves to create images of your heart. It provides your doctor with information about the size and shape of your heart and how well your heart's chambers and valves are working. This procedure takes approximately one hour. There are no restrictions for this procedure.    Follow-Up: At [facility], you and your health needs are our priority.  As part of our continuing mission to provide you with exceptional heart care, we have created designated Provider Care Teams.  These Care Teams include your primary Cardiologist (physician) and Advanced Practice Providers (APPs -  Physician Assistants and Nurse Practitioners) who all work together to provide you with the care you need, when you need it.  Your next appointment:   12 month(s)  Feb 2022 after echo is completed  The format for your next appointment:   In Person  Provider:   Bryan Lemma, MD  Other Instructions n/a    Studies Ordered:   Orders Placed This Encounter  Procedures  . EKG 12-Lead  . ECHOCARDIOGRAM COMPLETE     Bryan Lemma, M.D., M.S. Interventional Cardiologist   Pager # [phone] Phone # [phone] [address] [place], [place] [address]   Thank you for choosing Heartcare at [facility]!!

## 2020-02-10 NOTE — Patient Instructions (Addendum)
Medication Instructions:  No changes *If you need a refill on your cardiac medications before your next appointment, please call your pharmacy*  Lab Work: Not needed  Testing/Procedures: Will be schedule at [facility] [address] in  Jan 2022 Your physician has requested that you have an echocardiogram. Echocardiography is a painless test that uses sound waves to create images of your heart. It provides your doctor with information about the size and shape of your heart and how well your heart's chambers and valves are working. This procedure takes approximately one hour. There are no restrictions for this procedure.    Follow-Up: At [facility], you and your health needs are our priority.  As part of our continuing mission to provide you with exceptional heart care, we have created designated Provider Care Teams.  These Care Teams include your primary Cardiologist (physician) and Advanced Practice Providers (APPs -  Physician Assistants and Nurse Practitioners) who all work together to provide you with the care you need, when you need it.  Your next appointment:   12 month(s)  Feb 2022 after echo is completed  The format for your next appointment:   In Person  Provider:   Bryan Lemma, MD  Other Instructions n/a

## 2020-02-13 ENCOUNTER — Encounter: Payer: Self-pay | Admitting: Cardiology

## 2020-02-13 NOTE — Assessment & Plan Note (Signed)
On warfarin, monitored by CVRR (Cardiovascular Risk Reduction) Coumadin clinic.

## 2020-02-13 NOTE — Assessment & Plan Note (Signed)
Remains on moderate dose atorvastatin.  Lipids have been well controlled.  Last checked in May 2020.  Labs followed by PCP.  Continue to follow.

## 2020-02-13 NOTE — Assessment & Plan Note (Signed)
Most recent echo was in 2019.  Will be due for echo roughly time next visit.  Relatively normal baseline gradients.  Remains on warfarin with no bleeding issues. Continue discussed SBE prophylaxis requirement. On low-dose beta-blocker. Plan: Recheck echo prior to next visit.

## 2020-02-13 NOTE — Assessment & Plan Note (Signed)
Blood pressure well controlled on low-dose carvedilol plus amlodipine. Continue current meds.

## 2020-02-25 ENCOUNTER — Ambulatory Visit: Payer: Self-pay | Admitting: *Deleted

## 2020-02-25 ENCOUNTER — Other Ambulatory Visit: Payer: Self-pay

## 2020-02-25 VITALS — BP 110/62 | HR 62

## 2020-02-25 DIAGNOSIS — Z952 Presence of prosthetic heart valve: Secondary | ICD-10-CM

## 2020-02-25 DIAGNOSIS — Z7901 Long term (current) use of anticoagulants: Secondary | ICD-10-CM

## 2020-02-25 NOTE — Progress Notes (Signed)
INR per standing orders. Anticoag clinic CC'd on results.

## 2020-02-26 ENCOUNTER — Ambulatory Visit (INDEPENDENT_AMBULATORY_CARE_PROVIDER_SITE_OTHER): Payer: PRIVATE HEALTH INSURANCE | Admitting: Pharmacist Clinician (PhC)/ Clinical Pharmacy Specialist

## 2020-02-26 DIAGNOSIS — Z7901 Long term (current) use of anticoagulants: Secondary | ICD-10-CM

## 2020-02-26 DIAGNOSIS — Z952 Presence of prosthetic heart valve: Secondary | ICD-10-CM

## 2020-02-26 LAB — PROTIME-INR
INR: 2.2 — ABNORMAL HIGH (ref 0.9–1.2)
Prothrombin Time: 23.3 s — ABNORMAL HIGH (ref 9.1–12.0)

## 2020-02-26 NOTE — Progress Notes (Signed)
Pt notified by phone mesg of INR within goal. Awaiting anticoag clinic review and encounter. Scheduled pt for 4 weeks, 03/24/20 at 0930, for next check.

## 2020-02-26 NOTE — Progress Notes (Signed)
Anticoag encounter reviewed. 4 week recheck. Appt left as scheduled.

## 2020-02-28 NOTE — Progress Notes (Signed)
Noted repeat lab in 4 weeks and patient notified anticoagulation clinic instructions and results by RN Rolly Salter

## 2020-03-31 ENCOUNTER — Ambulatory Visit: Payer: Self-pay | Admitting: *Deleted

## 2020-03-31 ENCOUNTER — Other Ambulatory Visit: Payer: Self-pay

## 2020-03-31 VITALS — BP 118/67 | HR 58

## 2020-03-31 DIAGNOSIS — Z7901 Long term (current) use of anticoagulants: Secondary | ICD-10-CM

## 2020-03-31 DIAGNOSIS — Z952 Presence of prosthetic heart valve: Secondary | ICD-10-CM

## 2020-04-01 ENCOUNTER — Ambulatory Visit (INDEPENDENT_AMBULATORY_CARE_PROVIDER_SITE_OTHER): Payer: PRIVATE HEALTH INSURANCE | Admitting: Pharmacist

## 2020-04-01 DIAGNOSIS — Z952 Presence of prosthetic heart valve: Secondary | ICD-10-CM

## 2020-04-01 DIAGNOSIS — Z7901 Long term (current) use of anticoagulants: Secondary | ICD-10-CM

## 2020-04-01 LAB — PROTIME-INR
INR: 2.4 — ABNORMAL HIGH (ref 0.9–1.2)
Prothrombin Time: 24.8 s — ABNORMAL HIGH (ref 9.1–12.0)

## 2020-04-01 NOTE — Progress Notes (Signed)
noted 

## 2020-04-01 NOTE — Progress Notes (Signed)
Anti-coag clinic has already contacted pt today. Pt within therapeutic goal. No changes in dosing made. Appt scheduled over phone with pt for next draw 04/28/20 at 0930. Pt denies further questions.

## 2020-04-07 ENCOUNTER — Other Ambulatory Visit: Payer: Self-pay | Admitting: Cardiovascular Disease

## 2020-04-28 ENCOUNTER — Ambulatory Visit: Payer: Self-pay | Admitting: *Deleted

## 2020-04-28 ENCOUNTER — Other Ambulatory Visit: Payer: Self-pay

## 2020-04-28 VITALS — BP 125/72 | HR 57

## 2020-04-28 DIAGNOSIS — Z7901 Long term (current) use of anticoagulants: Secondary | ICD-10-CM

## 2020-04-28 DIAGNOSIS — Z952 Presence of prosthetic heart valve: Secondary | ICD-10-CM

## 2020-04-28 LAB — POCT INR: INR: 2.5 (ref 2.0–3.0)

## 2020-04-29 ENCOUNTER — Ambulatory Visit (INDEPENDENT_AMBULATORY_CARE_PROVIDER_SITE_OTHER): Payer: PRIVATE HEALTH INSURANCE | Admitting: Cardiology

## 2020-04-29 DIAGNOSIS — Z7901 Long term (current) use of anticoagulants: Secondary | ICD-10-CM | POA: Diagnosis not present

## 2020-04-29 DIAGNOSIS — Z952 Presence of prosthetic heart valve: Secondary | ICD-10-CM

## 2020-04-29 LAB — PROTIME-INR
INR: 2.5 — ABNORMAL HIGH (ref 0.9–1.2)
Prothrombin Time: 25.8 s — ABNORMAL HIGH (ref 9.1–12.0)

## 2020-05-02 NOTE — Progress Notes (Signed)
Anticoag clinic contacted pt per chart review. No changes to dosing. Repeat In 4 weeks. Pt scheduled and emailed appt date/time of 05/27/20 at 0930.

## 2020-06-06 ENCOUNTER — Ambulatory Visit: Payer: Self-pay | Admitting: *Deleted

## 2020-06-06 ENCOUNTER — Other Ambulatory Visit: Payer: Self-pay

## 2020-06-06 VITALS — BP 122/70 | HR 56

## 2020-06-06 DIAGNOSIS — Z952 Presence of prosthetic heart valve: Secondary | ICD-10-CM

## 2020-06-06 DIAGNOSIS — Z7901 Long term (current) use of anticoagulants: Secondary | ICD-10-CM

## 2020-06-06 LAB — POCT INR: INR: 3.5 — AB (ref 2.0–3.0)

## 2020-06-06 NOTE — Progress Notes (Signed)
INR per anticoag/cardiology MD orders. Anticoag clinic CC'd on results.

## 2020-06-07 ENCOUNTER — Ambulatory Visit (INDEPENDENT_AMBULATORY_CARE_PROVIDER_SITE_OTHER): Payer: PRIVATE HEALTH INSURANCE | Admitting: Cardiology

## 2020-06-07 DIAGNOSIS — Z952 Presence of prosthetic heart valve: Secondary | ICD-10-CM | POA: Diagnosis not present

## 2020-06-07 DIAGNOSIS — Z7901 Long term (current) use of anticoagulants: Secondary | ICD-10-CM | POA: Diagnosis not present

## 2020-06-07 LAB — PROTIME-INR
INR: 3.5 — ABNORMAL HIGH (ref 0.9–1.2)
Prothrombin Time: 35.4 s — ABNORMAL HIGH (ref 9.1–12.0)

## 2020-06-07 NOTE — Progress Notes (Signed)
Anticoag team CC on results. Their encounter in process. Notified pt of result and being out of goal range. She will be looking for anticoag clinic's communication.

## 2020-06-07 NOTE — Progress Notes (Signed)
Noted awaiting instructions from anticoagulation clinic and patient notified of results by RN Rolly Salter

## 2020-06-09 ENCOUNTER — Other Ambulatory Visit: Payer: Self-pay | Admitting: Cardiovascular Disease

## 2020-07-01 ENCOUNTER — Other Ambulatory Visit: Payer: Self-pay | Admitting: Cardiovascular Disease

## 2020-07-06 ENCOUNTER — Telehealth: Payer: Self-pay

## 2020-07-06 NOTE — Telephone Encounter (Signed)
Overdue inr 

## 2020-07-07 ENCOUNTER — Other Ambulatory Visit: Payer: Self-pay

## 2020-07-07 ENCOUNTER — Ambulatory Visit: Payer: Self-pay | Admitting: *Deleted

## 2020-07-07 VITALS — BP 124/68 | HR 58

## 2020-07-07 DIAGNOSIS — Z7901 Long term (current) use of anticoagulants: Secondary | ICD-10-CM

## 2020-07-07 DIAGNOSIS — Z952 Presence of prosthetic heart valve: Secondary | ICD-10-CM

## 2020-07-07 NOTE — Progress Notes (Signed)
Routine INR check. CV Anti-coag CC's on results.

## 2020-07-08 ENCOUNTER — Ambulatory Visit (INDEPENDENT_AMBULATORY_CARE_PROVIDER_SITE_OTHER): Payer: PRIVATE HEALTH INSURANCE | Admitting: Pharmacist

## 2020-07-08 DIAGNOSIS — Z7901 Long term (current) use of anticoagulants: Secondary | ICD-10-CM | POA: Diagnosis not present

## 2020-07-08 DIAGNOSIS — Z952 Presence of prosthetic heart valve: Secondary | ICD-10-CM

## 2020-07-08 LAB — PROTIME-INR
INR: 3.2 — ABNORMAL HIGH (ref 0.9–1.2)
Prothrombin Time: 32.4 s — ABNORMAL HIGH (ref 9.1–12.0)

## 2020-07-08 NOTE — Progress Notes (Signed)
Noted. Todays CV encounter not completed yet. Spoke with pt over phone this morning notifying her of result. Per encounter review, CV awaiting return call from pt. No new orders yet.

## 2020-07-08 NOTE — Progress Notes (Signed)
Noted patient awaiting further instructions from anticoagulation clinic staff 

## 2020-07-11 NOTE — Progress Notes (Signed)
noted 

## 2020-07-11 NOTE — Progress Notes (Signed)
Next INR due 3 weeks from previous per anticoag note. Appt made for 07/28/20 and emailed to pt.

## 2020-07-11 NOTE — Progress Notes (Signed)
Noted next lab in 3 weeks appt 07/28/2020

## 2020-07-28 ENCOUNTER — Ambulatory Visit: Payer: Self-pay | Admitting: *Deleted

## 2020-07-28 ENCOUNTER — Other Ambulatory Visit: Payer: Self-pay

## 2020-07-28 VITALS — BP 119/69 | HR 58

## 2020-07-28 DIAGNOSIS — Z7901 Long term (current) use of anticoagulants: Secondary | ICD-10-CM

## 2020-07-28 DIAGNOSIS — Z952 Presence of prosthetic heart valve: Secondary | ICD-10-CM

## 2020-07-28 NOTE — Progress Notes (Signed)
INR per regular schedule. Anticoag clinic CC'd on results.

## 2020-07-28 NOTE — Addendum Note (Signed)
Addended by: Loraine Grip on: 07/28/2020 10:42 AM   Modules accepted: Orders

## 2020-07-30 LAB — PROTIME-INR

## 2020-08-01 NOTE — Progress Notes (Signed)
Good morning. This specimen was cancelled due to a missed pickup. [place]. Will plan to redraw upon her return.

## 2020-08-01 NOTE — Progress Notes (Signed)
Noted patient on vacation until next week RN Rolly Salter will redraw patient next week

## 2020-08-09 ENCOUNTER — Other Ambulatory Visit: Payer: Self-pay

## 2020-08-09 ENCOUNTER — Ambulatory Visit: Payer: Self-pay | Admitting: *Deleted

## 2020-08-09 DIAGNOSIS — Z7901 Long term (current) use of anticoagulants: Secondary | ICD-10-CM

## 2020-08-09 DIAGNOSIS — Z952 Presence of prosthetic heart valve: Secondary | ICD-10-CM

## 2020-08-09 NOTE — Progress Notes (Signed)
Patient returned from vacation and had repeat serum draw today with RN Rolly Salter expecting results tomorrow.

## 2020-08-09 NOTE — Progress Notes (Signed)
Repeat INR draw

## 2020-08-10 ENCOUNTER — Ambulatory Visit (INDEPENDENT_AMBULATORY_CARE_PROVIDER_SITE_OTHER): Payer: PRIVATE HEALTH INSURANCE | Admitting: Pharmacist

## 2020-08-10 DIAGNOSIS — Z7901 Long term (current) use of anticoagulants: Secondary | ICD-10-CM | POA: Diagnosis not present

## 2020-08-10 DIAGNOSIS — Z952 Presence of prosthetic heart valve: Secondary | ICD-10-CM | POA: Diagnosis not present

## 2020-08-10 LAB — PROTIME-INR
INR: 2.4 — ABNORMAL HIGH (ref 0.9–1.2)
Prothrombin Time: 24.3 s — ABNORMAL HIGH (ref 9.1–12.0)

## 2020-08-11 NOTE — Progress Notes (Signed)
Noted next draw scheduled

## 2020-08-11 NOTE — Progress Notes (Signed)
Next appt scheduled for 9/13 at 0930 and emailed to pt.

## 2020-09-05 ENCOUNTER — Ambulatory Visit: Payer: Self-pay | Admitting: *Deleted

## 2020-09-05 ENCOUNTER — Other Ambulatory Visit: Payer: Self-pay

## 2020-09-05 VITALS — BP 112/73 | HR 64

## 2020-09-05 DIAGNOSIS — Z7901 Long term (current) use of anticoagulants: Secondary | ICD-10-CM

## 2020-09-05 DIAGNOSIS — Z952 Presence of prosthetic heart valve: Secondary | ICD-10-CM

## 2020-09-05 NOTE — Progress Notes (Signed)
INR per Cards. Anticoag clinicCC'd on results.

## 2020-09-06 ENCOUNTER — Ambulatory Visit (INDEPENDENT_AMBULATORY_CARE_PROVIDER_SITE_OTHER): Payer: PRIVATE HEALTH INSURANCE | Admitting: Internal Medicine

## 2020-09-06 DIAGNOSIS — Z952 Presence of prosthetic heart valve: Secondary | ICD-10-CM

## 2020-09-06 DIAGNOSIS — Z7901 Long term (current) use of anticoagulants: Secondary | ICD-10-CM

## 2020-09-06 LAB — POCT INR: INR: 2.7 (ref 2.0–3.0)

## 2020-09-06 LAB — PROTIME-INR
INR: 2.7 — ABNORMAL HIGH (ref 0.9–1.2)
Prothrombin Time: 27.1 s — ABNORMAL HIGH (ref 9.1–12.0)

## 2020-09-06 NOTE — Progress Notes (Signed)
Noted follow up in 4 weeks and no dose change anticoagulation per patient/scheduled for next lab draw with RN Rolly Salter

## 2020-09-06 NOTE — Progress Notes (Signed)
Pt has spoken with anticoag clinic. No dose changes and repeat in 4 weeks. Appt emailed to pt.

## 2020-09-06 NOTE — Patient Instructions (Signed)
Continue dose to 7.5 mg (1.5 tablets) daily except 1 tablet each Sunday.  Repeat INR in 4 weeks.  Pt told

## 2020-10-05 ENCOUNTER — Telehealth: Payer: Self-pay

## 2020-10-05 NOTE — Telephone Encounter (Signed)
Called and lmomed pt overdue inr 

## 2020-10-10 ENCOUNTER — Telehealth: Payer: Self-pay

## 2020-10-10 NOTE — Telephone Encounter (Signed)
Called and spoke w/pt regarding overdue inr and they stated that they will be making an appt asap

## 2020-10-11 ENCOUNTER — Other Ambulatory Visit: Payer: Self-pay

## 2020-10-11 ENCOUNTER — Ambulatory Visit: Payer: Self-pay | Admitting: *Deleted

## 2020-10-11 VITALS — BP 139/74 | HR 59

## 2020-10-11 DIAGNOSIS — Z952 Presence of prosthetic heart valve: Secondary | ICD-10-CM

## 2020-10-11 DIAGNOSIS — Z7901 Long term (current) use of anticoagulants: Secondary | ICD-10-CM

## 2020-10-11 LAB — POCT INR: INR: 1.8 — AB (ref 2.0–3.0)

## 2020-10-12 ENCOUNTER — Ambulatory Visit (INDEPENDENT_AMBULATORY_CARE_PROVIDER_SITE_OTHER): Payer: PRIVATE HEALTH INSURANCE | Admitting: Cardiovascular Disease

## 2020-10-12 DIAGNOSIS — Z952 Presence of prosthetic heart valve: Secondary | ICD-10-CM

## 2020-10-12 DIAGNOSIS — Z7901 Long term (current) use of anticoagulants: Secondary | ICD-10-CM | POA: Diagnosis not present

## 2020-10-12 LAB — PROTIME-INR
INR: 1.8 — ABNORMAL HIGH (ref 0.9–1.2)
Prothrombin Time: 18.2 s — ABNORMAL HIGH (ref 9.1–12.0)

## 2020-10-13 NOTE — Progress Notes (Signed)
Noted follow up lab appt scheduled 

## 2020-10-13 NOTE — Progress Notes (Signed)
Appt made for 11/07/20 at 10:00 for repeat INR.

## 2020-10-18 ENCOUNTER — Encounter: Payer: Self-pay | Admitting: Internal Medicine

## 2020-11-07 ENCOUNTER — Ambulatory Visit: Payer: Self-pay | Admitting: *Deleted

## 2020-11-07 ENCOUNTER — Other Ambulatory Visit: Payer: Self-pay

## 2020-11-07 VITALS — BP 108/69 | HR 61

## 2020-11-07 DIAGNOSIS — Z7901 Long term (current) use of anticoagulants: Secondary | ICD-10-CM

## 2020-11-07 DIAGNOSIS — Z952 Presence of prosthetic heart valve: Secondary | ICD-10-CM

## 2020-11-08 ENCOUNTER — Ambulatory Visit: Payer: Self-pay | Admitting: *Deleted

## 2020-11-08 DIAGNOSIS — Z952 Presence of prosthetic heart valve: Secondary | ICD-10-CM

## 2020-11-08 DIAGNOSIS — Z7901 Long term (current) use of anticoagulants: Secondary | ICD-10-CM

## 2020-11-08 LAB — POCT INR: INR: 2.1 (ref 2.0–3.0)

## 2020-11-08 NOTE — Progress Notes (Signed)
Redraw of INR as yesterday's sample did not get picked up.

## 2020-11-09 ENCOUNTER — Ambulatory Visit (INDEPENDENT_AMBULATORY_CARE_PROVIDER_SITE_OTHER): Payer: PRIVATE HEALTH INSURANCE | Admitting: Cardiology

## 2020-11-09 DIAGNOSIS — Z952 Presence of prosthetic heart valve: Secondary | ICD-10-CM

## 2020-11-09 DIAGNOSIS — Z7901 Long term (current) use of anticoagulants: Secondary | ICD-10-CM

## 2020-11-09 LAB — PROTIME-INR
INR: 2.1 — ABNORMAL HIGH (ref 0.9–1.2)
Prothrombin Time: 21.3 s — ABNORMAL HIGH (ref 9.1–12.0)

## 2020-11-10 NOTE — Progress Notes (Signed)
Cardiology has reviewed and spoke with pt yesterday. No change in dosing. Repeat in 4 weeks. Appt sent by email to pt for 12/05/20.

## 2020-11-10 NOTE — Progress Notes (Signed)
Reviewed Epic anticoagulation note " Continue taking 7.5 mg (1.5 tablets) daily except 1 tablet each Sunday.  Repeat INR in 4 weeks. "  RN Rolly Salter scheduled patient for next INR draw.

## 2020-12-05 ENCOUNTER — Ambulatory Visit: Payer: Self-pay | Admitting: *Deleted

## 2020-12-05 ENCOUNTER — Other Ambulatory Visit: Payer: Self-pay

## 2020-12-05 VITALS — BP 133/69 | HR 56

## 2020-12-05 DIAGNOSIS — Z952 Presence of prosthetic heart valve: Secondary | ICD-10-CM

## 2020-12-05 DIAGNOSIS — Z7901 Long term (current) use of anticoagulants: Secondary | ICD-10-CM

## 2020-12-05 NOTE — Progress Notes (Signed)
Routine BP check and INR draw. Anticoag clinic CC'd on results.

## 2020-12-06 ENCOUNTER — Ambulatory Visit (INDEPENDENT_AMBULATORY_CARE_PROVIDER_SITE_OTHER): Payer: PRIVATE HEALTH INSURANCE | Admitting: Pharmacist Clinician (PhC)/ Clinical Pharmacy Specialist

## 2020-12-06 DIAGNOSIS — Z7901 Long term (current) use of anticoagulants: Secondary | ICD-10-CM | POA: Diagnosis not present

## 2020-12-06 DIAGNOSIS — Z952 Presence of prosthetic heart valve: Secondary | ICD-10-CM | POA: Diagnosis not present

## 2020-12-06 LAB — PROTIME-INR
INR: 2.4 — ABNORMAL HIGH (ref 0.9–1.2)
Prothrombin Time: 24 s — ABNORMAL HIGH (ref 9.1–12.0)

## 2020-12-06 NOTE — Progress Notes (Signed)
Results reviewed with pt over phone. No change in dosing per anticoag clinic notes. Appt made for 4 weeks for recheck.

## 2020-12-06 NOTE — Progress Notes (Signed)
Reviewed RN Rolly Salter note patient verbalized understanding no questions and recheck in 4 weeks

## 2021-01-01 ENCOUNTER — Other Ambulatory Visit: Payer: Self-pay | Admitting: Cardiovascular Disease

## 2021-01-02 ENCOUNTER — Other Ambulatory Visit: Payer: Self-pay

## 2021-01-02 ENCOUNTER — Ambulatory Visit: Payer: Self-pay | Admitting: *Deleted

## 2021-01-02 VITALS — BP 127/67 | HR 51

## 2021-01-02 DIAGNOSIS — Z7901 Long term (current) use of anticoagulants: Secondary | ICD-10-CM

## 2021-01-02 DIAGNOSIS — Z952 Presence of prosthetic heart valve: Secondary | ICD-10-CM

## 2021-01-02 LAB — POCT INR: INR: 1.8 — AB (ref 2.0–3.0)

## 2021-01-02 NOTE — Progress Notes (Signed)
Routine INR draw and BP check.

## 2021-01-03 ENCOUNTER — Ambulatory Visit (INDEPENDENT_AMBULATORY_CARE_PROVIDER_SITE_OTHER): Payer: PRIVATE HEALTH INSURANCE | Admitting: Cardiovascular Disease

## 2021-01-03 DIAGNOSIS — Z952 Presence of prosthetic heart valve: Secondary | ICD-10-CM

## 2021-01-03 DIAGNOSIS — Z7901 Long term (current) use of anticoagulants: Secondary | ICD-10-CM

## 2021-01-03 LAB — PROTIME-INR
INR: 1.8 — ABNORMAL HIGH (ref 0.9–1.2)
Prothrombin Time: 18.9 s — ABNORMAL HIGH (ref 9.1–12.0)

## 2021-01-03 NOTE — Progress Notes (Signed)
Reviewed with pt by phone. She has not heard from anti-coag clinic yet. Will check back for recommended repeat draw time frame.

## 2021-01-03 NOTE — Progress Notes (Signed)
Noted patient awaiting further instructions from anticoagulation clinic staff

## 2021-01-05 NOTE — Progress Notes (Signed)
Appt made for 01/30/21 for repeat BP and INR draw. Emailed to pt.

## 2021-01-05 NOTE — Progress Notes (Signed)
Noted patient scheduled for repeat INR/PT 01/30/2021

## 2021-01-10 ENCOUNTER — Other Ambulatory Visit (HOSPITAL_COMMUNITY): Payer: PRIVATE HEALTH INSURANCE

## 2021-01-27 ENCOUNTER — Ambulatory Visit (HOSPITAL_COMMUNITY): Payer: No Typology Code available for payment source | Attending: Internal Medicine

## 2021-01-27 ENCOUNTER — Other Ambulatory Visit: Payer: Self-pay

## 2021-01-27 DIAGNOSIS — Z952 Presence of prosthetic heart valve: Secondary | ICD-10-CM | POA: Diagnosis not present

## 2021-01-27 DIAGNOSIS — I10 Essential (primary) hypertension: Secondary | ICD-10-CM | POA: Diagnosis not present

## 2021-01-27 HISTORY — PX: TRANSTHORACIC ECHOCARDIOGRAM: SHX275

## 2021-01-27 LAB — ECHOCARDIOGRAM COMPLETE
AV Mean grad: 23.8 mmHg
AV Peak grad: 46.9 mmHg
Ao pk vel: 3.43 m/s
Area-P 1/2: 4.1 cm2
S' Lateral: 3.3 cm

## 2021-01-30 ENCOUNTER — Telehealth: Payer: Self-pay | Admitting: *Deleted

## 2021-01-30 NOTE — Telephone Encounter (Signed)
Good morning. Since it will likely notify you that Victoria [name] will be overdue for her INR check soon, wanted to notify you that Victoria [name] asked to rescheduled her appt that was on the books for today. She has her annual appt with Dr. Herbie Baltimore 2/17 and needs to complete fasting labs prior to this. To avoid multiple sticks, we now have her scheduled for her fasting labs, INR, and BP on Monday 02/06/21.  Please let me know if you have any questions or concerns.

## 2021-01-30 NOTE — Telephone Encounter (Signed)
Secure chat mesg received from CMA Eather Colas acknowledging delayed INR check. No further questions or concerns noted.

## 2021-02-06 ENCOUNTER — Other Ambulatory Visit: Payer: Self-pay

## 2021-02-06 ENCOUNTER — Ambulatory Visit: Payer: Self-pay | Admitting: *Deleted

## 2021-02-06 VITALS — BP 136/72 | HR 56 | Ht 60.5 in | Wt 120.0 lb

## 2021-02-06 DIAGNOSIS — Z952 Presence of prosthetic heart valve: Secondary | ICD-10-CM

## 2021-02-06 DIAGNOSIS — Z7901 Long term (current) use of anticoagulants: Secondary | ICD-10-CM

## 2021-02-06 DIAGNOSIS — Z0000 Encounter for general adult medical examination without abnormal findings: Secondary | ICD-10-CM

## 2021-02-06 LAB — POCT INR: INR: 2.1 (ref 2.0–3.0)

## 2021-02-06 NOTE — Progress Notes (Signed)
[employer] insurance premium discount evaluation: Labs Drawn. Replacements ROI form signed. Tobacco Free Attestation form signed.  Forms placed in paper chart.  INR also drawn per standing cardiology orders.

## 2021-02-07 ENCOUNTER — Ambulatory Visit (INDEPENDENT_AMBULATORY_CARE_PROVIDER_SITE_OTHER): Payer: PRIVATE HEALTH INSURANCE | Admitting: Cardiology

## 2021-02-07 DIAGNOSIS — Z7901 Long term (current) use of anticoagulants: Secondary | ICD-10-CM

## 2021-02-07 DIAGNOSIS — Z952 Presence of prosthetic heart valve: Secondary | ICD-10-CM

## 2021-02-07 LAB — PROTIME-INR
INR: 2.1 — ABNORMAL HIGH (ref 0.9–1.2)
Prothrombin Time: 21.7 s — ABNORMAL HIGH (ref 9.1–12.0)

## 2021-02-07 LAB — CMP12+LP+TP+TSH+6AC+CBC/D/PLT
ALT: 17 IU/L (ref 0–32)
AST: 27 IU/L (ref 0–40)
Albumin/Globulin Ratio: 1.8 (ref 1.2–2.2)
Albumin: 4.6 g/dL (ref 3.8–4.9)
Alkaline Phosphatase: 81 IU/L (ref 44–121)
BUN/Creatinine Ratio: 16 (ref 9–23)
BUN: 10 mg/dL (ref 6–24)
Basophils Absolute: 0 10*3/uL (ref 0.0–0.2)
Basos: 1 %
Bilirubin Total: 1 mg/dL (ref 0.0–1.2)
Calcium: 9.2 mg/dL (ref 8.7–10.2)
Chloride: 103 mmol/L (ref 96–106)
Chol/HDL Ratio: 2.1 ratio (ref 0.0–4.4)
Cholesterol, Total: 145 mg/dL (ref 100–199)
Creatinine, Ser: 0.63 mg/dL (ref 0.57–1.00)
EOS (ABSOLUTE): 0.2 10*3/uL (ref 0.0–0.4)
Eos: 3 %
Estimated CHD Risk: <0.5 times avg. (ref 0.0–1.0)
Free Thyroxine Index: 1.3 (ref 1.2–4.9)
GFR calc Af Amer: 114 mL/min/{1.73_m2} (ref >59)
GFR calc non Af Amer: 99 mL/min/{1.73_m2} (ref >59)
GGT: 14 IU/L (ref 0–60)
Globulin, Total: 2.5 g/dL (ref 1.5–4.5)
Glucose: 110 mg/dL — ABNORMAL HIGH (ref 65–99)
HDL: 70 mg/dL (ref >39)
Hematocrit: 40.1 % (ref 34.0–46.6)
Hemoglobin: 13.2 g/dL (ref 11.1–15.9)
Immature Grans (Abs): 0 10*3/uL (ref 0.0–0.1)
Immature Granulocytes: 0 %
Iron: 72 ug/dL (ref 27–159)
LDH: 312 IU/L — ABNORMAL HIGH (ref 119–226)
LDL Chol Calc (NIH): 60 mg/dL (ref 0–99)
Lymphocytes Absolute: 1.4 10*3/uL (ref 0.7–3.1)
Lymphs: 20 %
MCH: 31.3 pg (ref 26.6–33.0)
MCHC: 32.9 g/dL (ref 31.5–35.7)
MCV: 95 fL (ref 79–97)
Monocytes Absolute: 0.6 10*3/uL (ref 0.1–0.9)
Monocytes: 9 %
Neutrophils Absolute: 5 10*3/uL (ref 1.4–7.0)
Neutrophils: 67 %
Phosphorus: 3.4 mg/dL (ref 3.0–4.3)
Platelets: 171 10*3/uL (ref 150–450)
Potassium: 4.2 mmol/L (ref 3.5–5.2)
RBC: 4.22 x10E6/uL (ref 3.77–5.28)
RDW: 12.8 % (ref 11.7–15.4)
Sodium: 140 mmol/L (ref 134–144)
T3 Uptake Ratio: 24 % (ref 24–39)
T4, Total: 5.6 ug/dL (ref 4.5–12.0)
TSH: 1.89 u[IU]/mL (ref 0.450–4.500)
Total Protein: 7.1 g/dL (ref 6.0–8.5)
Triglycerides: 77 mg/dL (ref 0–149)
Uric Acid: 4.9 mg/dL (ref 3.0–7.2)
VLDL Cholesterol Cal: 15 mg/dL (ref 5–40)
WBC: 7.3 10*3/uL (ref 3.4–10.8)

## 2021-02-07 LAB — HGB A1C W/O EAG: Hgb A1c MFr Bld: 5.9 % — ABNORMAL HIGH (ref 4.8–5.6)

## 2021-02-07 NOTE — Progress Notes (Signed)
Please give patient copy of labs and ask if we can send electronically to [facility]  Follow up with PCM elevated cholesterol, blood sugar (spot and 3 month average/Hgba1c), LDH (improving) and blood pressure.  INR/PT elevated on anticoagulation therapy. Anticoagulation clinic note from Dr Jens Som today states "INR goal:  2.0-3.0 TTR:  68.1 % (7 y) INR used for dosing:  2.1 (02/06/2021) Warfarin maintenance plan:  5 mg (5 mg x 1) every Sun; 7.5 mg (5 mg x 1.5) all other days Weekly warfarin total:  50 mg Plan last modified:  Rodriguez-Guzman, Raquel, RPH-CPP (10/07/2019) Next INR check:   Target end date:     Indications  Long term current use of anticoagulant therapy [Z79.01]  H/O aortic valve replacement   Take 2 tablets today only and then  Continue taking 7.5 mg (1.5 tablets) daily except 1 tablet each Sunday.  Repeat INR in 4 weeks.   Please give managing hypertension, prediabetes, cholesterol, liver enzyme,  and dash/high fiber diet handouts. BP 136/72 elevated above recommended levels blood pressure (greater than 110s/60s). I recommend weight loss, exercise 150 minutes per week; dietary fiber daily by mouth 20 grams women per up to date; eat whole grains/fruits/vegetables; keep added sugars to less than 100 calories/ 5 teaspoons for women per American [employer] Association;  electrolytes, iron, kidney function, thyroid and complete blood count normal

## 2021-02-09 ENCOUNTER — Encounter: Payer: Self-pay | Admitting: Cardiology

## 2021-02-09 ENCOUNTER — Other Ambulatory Visit: Payer: Self-pay

## 2021-02-09 ENCOUNTER — Ambulatory Visit: Payer: PRIVATE HEALTH INSURANCE | Admitting: Cardiology

## 2021-02-09 VITALS — BP 130/74 | HR 63 | Ht 60.0 in | Wt 122.2 lb

## 2021-02-09 DIAGNOSIS — Z7901 Long term (current) use of anticoagulants: Secondary | ICD-10-CM

## 2021-02-09 DIAGNOSIS — Z952 Presence of prosthetic heart valve: Secondary | ICD-10-CM

## 2021-02-09 DIAGNOSIS — I10 Essential (primary) hypertension: Secondary | ICD-10-CM

## 2021-02-09 DIAGNOSIS — E785 Hyperlipidemia, unspecified: Secondary | ICD-10-CM

## 2021-02-09 NOTE — Patient Instructions (Addendum)
Medication Instructions:  Not needed *If you need a refill on your cardiac medications before your next appointment, please call your pharmacy*   Lab Work: CMP LIPID HGBA1C  N 12 MONTHS  If you have labs (blood work) drawn today and your tests are completely normal, you will receive your results only by: Marland Kitchen MyChart Message (if you have MyChart) OR . A paper copy in the mail If you have any lab test that is abnormal or we need to change your treatment, we will call you to review the results.   Testing/Procedures:  NOT NEEDED  Follow-Up: At [facility], you and your health needs are our priority.  As part of our continuing mission to provide you with exceptional heart care, we have created designated Provider Care Teams.  These Care Teams include your primary Cardiologist (physician) and Advanced Practice Providers (APPs -  Physician Assistants and Nurse Practitioners) who all work together to provide you with the care you need, when you need it.     Your next appointment:   12 month(s)  The format for your next appointment:   In Person  Provider:   Bryan Lemma, MD

## 2021-02-09 NOTE — Progress Notes (Signed)
Primary Care Provider: Lorre Munroe, NP Cardiologist: Bryan Lemma, MD Electrophysiologist: None  Clinic Note: Chief Complaint  Patient presents with  . Follow-up    Doing well.  No complaints  . Aortic Stenosis    Status post AVR/Bentall for severe AS Congenital Bicuspid Valve   ===================================  ASSESSMENT/PLAN   Problem List Items Addressed This Visit    H/O aortic valve replacement - Primary (Chronic)    Follow-up echo shows normal functioning valve.  Only mild AI. Due for follow-up echo in 3 years. Remains on warfarin with no bleeding issues. On low-dose beta-blocker. Stressed the importance of SBE prophylaxis. => She is to contact the office for prescription if necessary.      Relevant Orders   EKG 12-Lead (Completed)   Comprehensive metabolic panel   Hemoglobin A1c   Essential hypertension (Chronic)    Blood pressure was pretty well controlled on low-dose carvedilol plus amlodipine..      Relevant Orders   EKG 12-Lead (Completed)   Comprehensive metabolic panel   Hemoglobin A1c   Dyslipidemia, goal LDL below 130 (Chronic)    Her lipids from this week look outstanding with an LDL of 60 on 40 mg atorvastatin.  As long as she is tolerating it, would not be any changes.      Relevant Orders   EKG 12-Lead (Completed)   Lipid panel   Long term current use of anticoagulant therapy (Chronic)    On warfarin for the mechanical AVR.  Monitored by CVRR.      Relevant Orders   EKG 12-Lead (Completed)   Comprehensive metabolic panel   Hemoglobin A1c     ===================================  HPI:    Victoria [name] is a 60 y.o. 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  She is doing remarkably well.  No problems of chest pain, heart failure or arrhythmias.  Has not had to take any prophylactic antibiotics in the past year.  Recent intake of tennis, slowly progressing. -> Routine follow-up echo ordered for evaluation prior to this visit.  Recent Hospitalizations: None  Reviewed  CV studies:    The following studies were reviewed today: (if available, images/films reviewed: From Epic Chart or Care Everywhere) . TTE 01/27/2021: Normal LV function.  EF 60 to 65%.  No R WMA.  Probably elevated LAP, but unable to determine due to mitral annular calcification.  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  21 mmHg mechanical valve present in the aortic position, well-seated.  Mild AI with no AS.Marland Kitchen  Ascending aorta and root size are normal (root and his aorta both replaced).   Interval History:   Victoria [name] returns here today for annual follow-up and to discuss results of her echo.  She is doing well.  No major complaints.  She is continuing on with her exercising and doing well.  Staying active and healthy.  No cardiac complaints.  CV Review of Symptoms (Summary): no chest pain or dyspnea on exertion negative for - edema, irregular heartbeat, orthopnea, palpitations, paroxysmal nocturnal dyspnea, rapid heart rate, shortness of breath or Syncope or near syncope, TIA/amaurosis fugax, claudication  The patient does not have symptoms concerning for COVID-19 infection (fever, chills, cough, or new  shortness of breath).   REVIEWED OF SYSTEMS   Review of Systems  Constitutional: Negative for malaise/fatigue and weight loss.  Respiratory: Negative for cough and shortness of breath.   Gastrointestinal: Negative for abdominal pain, blood in stool and melena.  Genitourinary: Negative for hematuria.  Musculoskeletal: Negative for joint pain.  Neurological: Negative for dizziness, weakness and headaches.  Psychiatric/Behavioral: Negative for memory loss.  The patient is not nervous/anxious and does not have insomnia.    I have reviewed and (if needed) personally updated the patient's problem list, medications, allergies, past medical and surgical history, social and family history.   PAST MEDICAL HISTORY   Past Medical History:  Diagnosis Date  . History of bicuspid aortic valve     with severe AS and dilated aortic root/arch.  . Hyperlipidemia   . Hypertension   . 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 Jude 21 mm mechanical valve, 24 mm IMA shield vascular graft for the ascending aorta/arch), L carotid artery bypassing    PAST SURGICAL HISTORY   Past Surgical History:  Procedure Laterality Date  . ABDOMINAL HYSTERECTOMY  2000   total  . AORTIC VALVE REPLACEMENT  January 2011   St. Jude 16mm mechanical valve, Bentall procesure & L cartoid artery bypassing   . BENTALL PROCEDURE  January 2011   See above  . CARDIAC CATHETERIZATION  December 2010   No significant coronary disease. Normal LV function.  . TRANSTHORACIC ECHOCARDIOGRAM  April 2014   Normal LV size and thickness. EF 65-70%.; Grade 3 diastolic dysfunction with elevated LAP; 21 mm tilting-disc St. Jude valve functioning normally. No pannus. Mild-moderate stenosis with velocities higher than anticipated for valve type. Peak gradient 35-39 mmHg, mean 20-24 mmHg.  Marland Kitchen TRANSTHORACIC ECHOCARDIOGRAM  1/'16, 1/19   a) 12/2014: Normal EF (60-65%), Normal wall motion. 21 mm St Jude Mech AoV Prosthesis- well seated, mild AI (not peri-valvular leak as originally noted). Peak-Mean Gradients: 35 mmHg & 22 mHg (likely size related).  Stable;; b) 12/2017: EF 60-65%.  Mild LVH.  Prosthetic aortic valve opens well.  Peak-mean gradients 36 and 19 mmHg respectively.  Appropriate for prosthesis.  Mild MR.  Trivial AI.    Immunization History  Administered Date(s) Administered  . Influenza,inj,Quad PF,6+ Mos 11/02/2016, 10/17/2017, 11/03/2019  . Influenza-Unspecified 10/06/2020     MEDICATIONS/ALLERGIES   Current Meds  Medication Sig  . amLODipine (NORVASC) 10 MG tablet TAKE 1 TABLET BY MOUTH EVERY DAY  . atorvastatin (LIPITOR) 40 MG tablet TAKE 1 TABLET BY MOUTH EVERYDAY AT BEDTIME  . carvedilol (COREG) 3.125 MG tablet TAKE 1 TABLET BY MOUTH EVERY 12 HOURS  . warfarin (COUMADIN) 5 MG tablet TAKE 1 TO 1 & 1/2 TABLETS BY MOUTH DAILY AS DIRECTED BY COUMADIN CLINIC    Allergies  Allergen Reactions  . Lisinopril     SOCIAL HISTORY/FAMILY HISTORY   Reviewed in Epic:  Pertinent findings:  Social History   Tobacco Use  . Smoking status: Former Smoker    Types: Cigarettes    Quit date: 01/01/2010    Years since quitting: 11.1  . Smokeless tobacco: Never Used  Substance Use Topics  . Alcohol use: Yes    Alcohol/week: 30.0 standard drinks    Types: 12 Standard drinks or equivalent, 18 Cans of beer per week  . Drug use: No   Social History   Social History Narrative   She is a divorced mother of one, grandmother 1. She usually exercises 4-5 days a week. Very active.  Quit smoking in 2011.   Takes 1-2 beers per day and is stable.    OBJCTIVE -PE, EKG, labs   Wt Readings from Last 3 Encounters:  02/09/21 122 lb 3.2 oz (55.4 kg)  02/06/21 120 lb (54.4 kg)  02/10/20 125 lb (56.7 kg)    Physical Exam: BP 130/74 (BP Location: Left Arm, Patient Position: Sitting)   Pulse 63   Ht 5' (1.524 m)   Wt 122 lb 3.2 oz (55.4 kg)   SpO2 97%   BMI 23.87 kg/m  Physical Exam Vitals reviewed.  Constitutional:      General: She is not in acute distress.    Appearance: Normal appearance. She is normal weight. She is not ill-appearing or toxic-appearing.     Comments: Well-nourished, well-groomed.  HENT:     Head: Normocephalic and atraumatic.  Neck:     Vascular: No carotid bruit (Radiated aortic murmur), hepatojugular reflux or JVD.  Cardiovascular:     Rate and Rhythm: Normal rate and regular rhythm.  No extrasystoles are present.    Chest Wall: PMI is  not displaced.     Pulses: Normal pulses.     Heart sounds: S1 normal. Murmur heard.  High-pitched harsh crescendo-decrescendo midsystolic murmur is present with a grade of 2/6 at the upper right sternal border radiating to the neck. No friction rub. No gallop.      Comments: Crisp mechanical S2 Pulmonary:     Effort: Pulmonary effort is normal. No respiratory distress.     Breath sounds: Normal breath sounds.  Chest:     Chest wall: No tenderness.  Musculoskeletal:        General: No swelling. Normal range of motion.     Cervical back: Normal range of motion and neck supple.  Skin:    General: Skin is warm and dry.  Neurological:     General: No focal deficit present.     Mental Status: She is alert and oriented to person, place, and time.     Motor: No weakness.     Gait: Gait normal.  Psychiatric:        Mood and Affect: Mood normal.        Behavior: Behavior normal.        Thought Content: Thought content normal.        Judgment: Judgment normal.     Adult ECG Report  Rate: 63 ;  Rhythm: normal sinus rhythm and Nonspecific ST and T wave changes.  Normal axis, intervals and durations.;   Narrative Interpretation: Normal EKG.  Recent Labs: Reviewed Lab Results  Component Value Date   CHOL 145 02/06/2021   HDL 70 02/06/2021   LDLCALC 60 02/06/2021   TRIG 77 02/06/2021   CHOLHDL 2.1 02/06/2021   Lab Results  Component Value Date   CREATININE 0.63 02/06/2021   BUN 10 02/06/2021   NA 140 02/06/2021   K 4.2 02/06/2021   CL 103 02/06/2021   CO2 27 07/06/2011   CBC Latest Ref Rng & Units 02/06/2021 05/22/2019 06/14/2017  WBC 3.4 - 10.8 x10E3/uL 7.3 7.0 6.9  Hemoglobin 11.1 - 15.9 g/dL 40.913.2 81.112.7 91.412.5  Hematocrit 34.0 - 46.6 % 40.1 38.6 39.7  Platelets 150 - 450 x10E3/uL 171 139(L) 238    Lab Results  Component Value Date   TSH 1.890 02/06/2021    ==================================================  COVID-19 Education: The signs and symptoms of COVID-19 were  discussed with the patient and how to seek care for testing (follow up with  PCP or arrange E-visit).   The importance of social distancing and COVID-19 vaccination was discussed today. The patient is practicing social distancing & Masking.   I spent a total of with the patient spent in direct patient consultation.  Additional time spent with chart review  / charting (studies, outside notes, etc): 10 min Total Time: 25 min   Current medicines are reviewed at length with the patient today.  (+/- concerns) N/A  This visit occurred during the SARS-CoV-2 public health emergency.  Safety protocols were in place, including screening questions prior to the visit, additional usage of staff PPE, and extensive cleaning of exam room while observing appropriate contact time as indicated for disinfecting solutions.  Notice: This dictation was prepared with Dragon dictation along with smaller phrase technology. Any transcriptional errors that result from this process are unintentional and may not be corrected upon review.  Patient Instructions / Medication Changes & Studies & Tests Ordered   Patient Instructions  Medication Instructions:  Not needed *If you need a refill on your cardiac medications before your next appointment, please call your pharmacy*   Lab Work: CMP LIPID HGBA1C  N 12 MONTHS  If you have labs (blood work) drawn today and your tests are completely normal, you will receive your results only by: Marland Kitchen MyChart Message (if you have MyChart) OR . A paper copy in the mail If you have any lab test that is abnormal or we need to change your treatment, we will call you to review the results.   Testing/Procedures:  NOT NEEDED  Follow-Up: At [facility], you and your health needs are our priority.  As part of our continuing mission to provide you with exceptional heart care, we have created designated Provider Care Teams.  These Care Teams include your primary Cardiologist  (physician) and Advanced Practice Providers (APPs -  Physician Assistants and Nurse Practitioners) who all work together to provide you with the care you need, when you need it.     Your next appointment:   12 month(s)  The format for your next appointment:   In Person  Provider:   Bryan Lemma, MD     Studies Ordered:   Orders Placed This Encounter  Procedures  . Lipid panel  . Comprehensive metabolic panel  . Hemoglobin A1c  . EKG 12-Lead     Bryan Lemma, M.D., M.S. Interventional Cardiologist   Pager # [phone] Phone # [phone] [address] [place], [place] [address]   Thank you for choosing Heart[facility] at [facility]!!

## 2021-03-05 ENCOUNTER — Encounter: Payer: Self-pay | Admitting: Cardiology

## 2021-03-05 NOTE — Assessment & Plan Note (Signed)
On warfarin for the mechanical AVR.  Monitored by CVRR.

## 2021-03-05 NOTE — Assessment & Plan Note (Signed)
Blood pressure was pretty well controlled on low-dose carvedilol plus amlodipine.Victoria [name]

## 2021-03-05 NOTE — Assessment & Plan Note (Signed)
Follow-up echo shows normal functioning valve.  Only mild AI. Due for follow-up echo in 3 years. Remains on warfarin with no bleeding issues. On low-dose beta-blocker. Stressed the importance of SBE prophylaxis. => She is to contact the office for prescription if necessary.

## 2021-03-05 NOTE — Assessment & Plan Note (Signed)
Her lipids from this week look outstanding with an LDL of 60 on 40 mg atorvastatin.  As long as she is tolerating it, would not be any changes.

## 2021-03-06 ENCOUNTER — Ambulatory Visit: Payer: Self-pay | Admitting: *Deleted

## 2021-03-06 ENCOUNTER — Other Ambulatory Visit: Payer: Self-pay

## 2021-03-06 VITALS — BP 128/72 | HR 55

## 2021-03-06 DIAGNOSIS — Z7901 Long term (current) use of anticoagulants: Secondary | ICD-10-CM

## 2021-03-06 DIAGNOSIS — Z952 Presence of prosthetic heart valve: Secondary | ICD-10-CM

## 2021-03-06 LAB — POCT INR: INR: 2.5 (ref 2.0–3.0)

## 2021-03-06 NOTE — Progress Notes (Signed)
Routine BP and INR level. Anticoag clinic CC'd on results.

## 2021-03-07 ENCOUNTER — Ambulatory Visit (INDEPENDENT_AMBULATORY_CARE_PROVIDER_SITE_OTHER): Payer: PRIVATE HEALTH INSURANCE | Admitting: Cardiovascular Disease

## 2021-03-07 ENCOUNTER — Other Ambulatory Visit: Payer: Self-pay | Admitting: Cardiology

## 2021-03-07 DIAGNOSIS — Z7901 Long term (current) use of anticoagulants: Secondary | ICD-10-CM

## 2021-03-07 DIAGNOSIS — Z952 Presence of prosthetic heart valve: Secondary | ICD-10-CM

## 2021-03-07 LAB — PROTIME-INR
INR: 2.5 — ABNORMAL HIGH (ref 0.9–1.2)
Prothrombin Time: 25.6 s — ABNORMAL HIGH (ref 9.1–12.0)

## 2021-03-07 NOTE — Progress Notes (Signed)
Noted reviewed RN Rolly Salter note next lab draw 04/03/2021

## 2021-03-07 NOTE — Progress Notes (Signed)
Results reviewed with pt by anti-coag clinic. No changes to regimen per chart review. Pt confirmed by phone that she spoke with them today. Emailed pt next appt 04/03/21 at 1030.

## 2021-04-03 ENCOUNTER — Other Ambulatory Visit: Payer: Self-pay

## 2021-04-03 ENCOUNTER — Ambulatory Visit: Payer: Self-pay | Admitting: *Deleted

## 2021-04-03 VITALS — BP 114/71 | HR 63

## 2021-04-03 DIAGNOSIS — Z952 Presence of prosthetic heart valve: Secondary | ICD-10-CM

## 2021-04-03 DIAGNOSIS — Z7901 Long term (current) use of anticoagulants: Secondary | ICD-10-CM

## 2021-04-03 LAB — POCT INR: INR: 2.6 (ref 2.0–3.0)

## 2021-04-03 NOTE — Progress Notes (Signed)
Routine BP and INR check per Cards orders. Anticoag clinic CC'd on results.

## 2021-04-04 ENCOUNTER — Ambulatory Visit (INDEPENDENT_AMBULATORY_CARE_PROVIDER_SITE_OTHER): Payer: PRIVATE HEALTH INSURANCE | Admitting: Cardiovascular Disease

## 2021-04-04 DIAGNOSIS — Z7901 Long term (current) use of anticoagulants: Secondary | ICD-10-CM | POA: Diagnosis not present

## 2021-04-04 DIAGNOSIS — Z952 Presence of prosthetic heart valve: Secondary | ICD-10-CM | POA: Diagnosis not present

## 2021-04-04 LAB — PROTIME-INR
INR: 2.6 — ABNORMAL HIGH (ref 0.9–1.2)
Prothrombin Time: 25.8 s — ABNORMAL HIGH (ref 9.1–12.0)

## 2021-04-04 NOTE — Progress Notes (Signed)
Reviewed RN Rolly Salter note; patient notified of results and to follow up with anticoagulation clinic

## 2021-04-04 NOTE — Progress Notes (Signed)
Notified pt by phone of results in goal and to expect phone call from anticoag clinic this morning, but no changes anticipated. She denies any questions or concerns.

## 2021-05-03 ENCOUNTER — Telehealth: Payer: Self-pay

## 2021-05-03 NOTE — Telephone Encounter (Signed)
lmom for overdue inr 

## 2021-05-05 ENCOUNTER — Ambulatory Visit: Payer: Self-pay | Admitting: *Deleted

## 2021-05-05 ENCOUNTER — Other Ambulatory Visit: Payer: Self-pay

## 2021-05-05 VITALS — BP 113/66 | HR 62

## 2021-05-05 DIAGNOSIS — Z7901 Long term (current) use of anticoagulants: Secondary | ICD-10-CM

## 2021-05-05 DIAGNOSIS — Z952 Presence of prosthetic heart valve: Secondary | ICD-10-CM

## 2021-05-05 LAB — POCT INR: INR: 2.6 (ref 2.0–3.0)

## 2021-05-05 NOTE — Progress Notes (Signed)
BP and INR per cardiology standing orders.

## 2021-05-06 LAB — PROTIME-INR
INR: 2.6 — ABNORMAL HIGH (ref 0.9–1.2)
Prothrombin Time: 26.7 s — ABNORMAL HIGH (ref 9.1–12.0)

## 2021-05-08 ENCOUNTER — Ambulatory Visit (INDEPENDENT_AMBULATORY_CARE_PROVIDER_SITE_OTHER): Payer: PRIVATE HEALTH INSURANCE | Admitting: Cardiology

## 2021-05-08 DIAGNOSIS — Z952 Presence of prosthetic heart valve: Secondary | ICD-10-CM

## 2021-05-08 DIAGNOSIS — Z7901 Long term (current) use of anticoagulants: Secondary | ICD-10-CM | POA: Diagnosis not present

## 2021-06-05 ENCOUNTER — Telehealth: Payer: Self-pay

## 2021-06-05 ENCOUNTER — Other Ambulatory Visit: Payer: Self-pay | Admitting: Cardiovascular Disease

## 2021-06-05 NOTE — Telephone Encounter (Signed)
Lmom for Warfarin inr test overdue

## 2021-06-07 ENCOUNTER — Telehealth: Payer: Self-pay

## 2021-06-07 NOTE — Telephone Encounter (Signed)
Lm overdue inr 

## 2021-06-12 ENCOUNTER — Other Ambulatory Visit: Payer: Self-pay

## 2021-06-12 ENCOUNTER — Telehealth: Payer: Self-pay

## 2021-06-12 ENCOUNTER — Ambulatory Visit: Payer: Self-pay | Admitting: *Deleted

## 2021-06-12 VITALS — BP 119/68 | HR 62

## 2021-06-12 DIAGNOSIS — Z952 Presence of prosthetic heart valve: Secondary | ICD-10-CM

## 2021-06-12 DIAGNOSIS — Z7901 Long term (current) use of anticoagulants: Secondary | ICD-10-CM

## 2021-06-12 LAB — POCT INR: INR: 3.8 — AB (ref 2.0–3.0)

## 2021-06-12 NOTE — Telephone Encounter (Signed)
Lmom for overdue inr 

## 2021-06-12 NOTE — Progress Notes (Signed)
INR per cardiology orders

## 2021-06-13 ENCOUNTER — Ambulatory Visit (INDEPENDENT_AMBULATORY_CARE_PROVIDER_SITE_OTHER): Payer: No Typology Code available for payment source | Admitting: Internal Medicine

## 2021-06-13 DIAGNOSIS — Z952 Presence of prosthetic heart valve: Secondary | ICD-10-CM

## 2021-06-13 DIAGNOSIS — Z7901 Long term (current) use of anticoagulants: Secondary | ICD-10-CM

## 2021-06-13 LAB — PROTIME-INR
INR: 3.8 — ABNORMAL HIGH (ref 0.9–1.2)
Prothrombin Time: 37.9 s — ABNORMAL HIGH (ref 9.1–12.0)

## 2021-07-06 ENCOUNTER — Encounter: Payer: Self-pay | Admitting: Registered Nurse

## 2021-07-06 ENCOUNTER — Telehealth: Payer: Self-pay | Admitting: Registered Nurse

## 2021-07-06 ENCOUNTER — Other Ambulatory Visit: Payer: Self-pay | Admitting: Cardiovascular Disease

## 2021-07-06 NOTE — Telephone Encounter (Signed)
Cindy patient's supervisor notified me patient reported mother with positive covid test and at party together prior to positive test this week. Asking if patient needs to be sent home from work to quarantine.  Contacted patient asymptomatic party pool outdoors denied hugging/kissing or being within 6 feet of mother for greater than 15 minutes.  Mother positive test today symptoms started Tuesday 07/04/21.  Noted that patient has not received covid booster vaccine/not up to date.  Counseled patient regarding variants covid 4 & 5 on upswing in [place] and [place].  Positive test rate in county 15% and increasing hospitalizations due to new variant.  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  Does not meet close contact criteria but recommended patient wear mask at work and when around others due to not up to date on vaccines/increased positive test rate/new variants in circulation.  No quarantine or testing indicated at this time.  HR notified along with supervisor Arline Asp.  Discussed with patient current variant symptoms can mimic allergies/cold and high fever/loss of taste/smell not typical of current covid variant.  Usually congestion/sore throat/rhinitis now.  Patient alert and oriented x3, spoke full sentences without difficulty.  No runny nose/throat clearing/nasal congestion or cough when in clinic noted.  Discussed with patient she can contact me at this number [phone] if questions when clinic closed if questions or concerns until RN Rolly Salter returns to clinic on Monday 18 July [phone].   Pt verbalized understanding and agreement with plan of care. No further questions/concerns at this time. Pt reminded to contact clinic with any changes in  symptoms or questions/concerns.

## 2021-07-10 ENCOUNTER — Other Ambulatory Visit: Payer: Self-pay | Admitting: Registered Nurse

## 2021-07-11 LAB — PROTIME-INR
INR: 2.7 — ABNORMAL HIGH (ref 0.9–1.2)
Prothrombin Time: 27.1 s — ABNORMAL HIGH (ref 9.1–12.0)

## 2021-07-12 ENCOUNTER — Ambulatory Visit (INDEPENDENT_AMBULATORY_CARE_PROVIDER_SITE_OTHER): Payer: No Typology Code available for payment source | Admitting: Cardiology

## 2021-07-12 DIAGNOSIS — Z952 Presence of prosthetic heart valve: Secondary | ICD-10-CM

## 2021-07-12 DIAGNOSIS — Z7901 Long term (current) use of anticoagulants: Secondary | ICD-10-CM

## 2021-07-13 NOTE — Progress Notes (Signed)
Reviewed RN Rolly Salter note repeat labs in 4 weeks no dose change per anticoagulation clinic.

## 2021-07-13 NOTE — Progress Notes (Signed)
Spoke with pt on Tues 7/19 and gave results. Advised within goal and routed to anticoag clinic for review. Today per chart review, no change to dose, repeat INR in 4 weeks. Appt made and sent to pt via email.

## 2021-08-09 ENCOUNTER — Telehealth: Payer: Self-pay

## 2021-08-09 NOTE — Telephone Encounter (Signed)
Lmom for overdue inr 

## 2021-08-10 ENCOUNTER — Ambulatory Visit: Payer: Self-pay | Admitting: *Deleted

## 2021-08-10 ENCOUNTER — Other Ambulatory Visit: Payer: Self-pay

## 2021-08-10 VITALS — BP 117/67 | HR 61

## 2021-08-10 DIAGNOSIS — Z7901 Long term (current) use of anticoagulants: Secondary | ICD-10-CM

## 2021-08-10 DIAGNOSIS — Z952 Presence of prosthetic heart valve: Secondary | ICD-10-CM

## 2021-08-10 NOTE — Progress Notes (Signed)
BP and INR for cardiology

## 2021-08-11 ENCOUNTER — Ambulatory Visit (INDEPENDENT_AMBULATORY_CARE_PROVIDER_SITE_OTHER)
Payer: No Typology Code available for payment source | Admitting: Pharmacist Clinician (PhC)/ Clinical Pharmacy Specialist

## 2021-08-11 DIAGNOSIS — Z7901 Long term (current) use of anticoagulants: Secondary | ICD-10-CM

## 2021-08-11 DIAGNOSIS — Z952 Presence of prosthetic heart valve: Secondary | ICD-10-CM

## 2021-08-11 LAB — PROTIME-INR
INR: 2.2 — ABNORMAL HIGH (ref 0.9–1.2)
Prothrombin Time: 22.7 s — ABNORMAL HIGH (ref 9.1–12.0)

## 2021-08-15 ENCOUNTER — Ambulatory Visit: Payer: No Typology Code available for payment source | Admitting: Internal Medicine

## 2021-08-15 ENCOUNTER — Encounter: Payer: Self-pay | Admitting: Internal Medicine

## 2021-08-15 ENCOUNTER — Other Ambulatory Visit: Payer: Self-pay

## 2021-08-15 VITALS — BP 130/71 | HR 56 | Temp 97.5°F | Resp 17 | Ht 60.0 in | Wt 122.8 lb

## 2021-08-15 DIAGNOSIS — R7303 Prediabetes: Secondary | ICD-10-CM | POA: Insufficient documentation

## 2021-08-15 DIAGNOSIS — K458 Other specified abdominal hernia without obstruction or gangrene: Secondary | ICD-10-CM

## 2021-08-15 NOTE — Patient Instructions (Signed)
Hernia, Adult   A hernia happens when tissue inside your body pushes out through a weak spot in your belly muscles (abdominal wall). This makes a round lump (bulge). The lump may be: In a scar from surgery that was done in your belly (incisional hernia). Near your belly button (umbilical hernia). In your groin (inguinal hernia). Your groin is the area where your leg meets your lower belly (abdomen). This kind of hernia could also be: In your scrotum, if you are female. In folds of skin around your vagina, if you are female. In your upper thigh (femoral hernia). Inside your belly (hiatal hernia). This happens when your stomach slides above the muscle between your belly and your chest (diaphragm). If your hernia is small and it does not cause pain, you may not need treatment. If your hernia is large or it causes pain, you may need surgery. Follow these instructions at home: Activity Avoid stretching or overusing (straining) the muscles near your hernia. Straining can happen when you: Lift something heavy. Poop (have a bowel movement). Do not lift anything that is heavier than 10 lb (4.5 kg), or the limit that you are told, until your doctor says that it is safe. Use the strength of your legs when you lift something heavy. Do not use only your back muscles to lift. General instructions Do these things if told by your doctor so you do not have trouble pooping (constipation): Drink enough fluid to keep your pee (urine) pale yellow. Eat foods that are high in fiber. These include fresh fruits and vegetables, whole grains, and beans. Limit foods that are high in fat and processed sugars. These include foods that are fried or sweet. Take medicine for trouble pooping. When you cough, try to cough gently. You may try to push your hernia in by very gently pressing on it when you are lying down. Do not try to force the bulge back in if it will not push in easily. If you are overweight, work with your  doctor to lose weight safely. Do not use any products that have nicotine or tobacco in them. These include cigarettes and e-cigarettes. If you need help quitting, ask your doctor. If you will be having surgery (hernia repair), watch your hernia for changes in shape, size, or color. Tell your doctor if you see any changes. Take over-the-counter and prescription medicines only as told by your doctor. Keep all follow-up visits as told by your doctor. Contact a doctor if: You get new pain, swelling, or redness near your hernia. You poop fewer times in a week than normal. You have trouble pooping. You have poop (stool) that is more dry than normal. You have poop that is harder or larger than normal. Get help right away if: You have a fever. You have belly pain that gets worse. You feel sick to your stomach (nauseous). You throw up (vomit). Your hernia cannot be pushed in by very gently pressing on it when you are lying down. Do not try to force the bulge back in if it will not push in easily. Your hernia: Changes in shape or size. Changes color. Feels hard or it hurts when you touch it. These symptoms may represent a serious problem that is an emergency. Do not wait to see if the symptoms will go away. Get medical help right away. Call your local emergency services (911 in the [place] Summary A hernia happens when tissue inside your body pushes out through a weak spot in the   belly muscles. This creates a bulge. If your hernia is small and it does not hurt, you may not need treatment. If your hernia is large or it hurts, you may need surgery. If you will be having surgery, watch your hernia for changes in shape, size, or color. Tell your doctor about any changes. This information is not intended to replace advice given to you by your health care provider. Make sure you discuss any questions you have with your health care provider. Document Revised: 04/02/2019 Document Reviewed:  09/11/2017 Elsevier Patient Education  2021 Elsevier Inc.  

## 2021-08-15 NOTE — Progress Notes (Signed)
Subjective:    Patient ID: Victoria [name], female    DOB: Oct 17, 1961, 60 y.o.   MRN: [id number]  HPI  Patient presents to clinic today with complaint of a hernia in her upper abdomen.  She reports she noticed this 1 year ago.  She reports the area can be tender at times.  She reports she had a drainage tube in the same area 11 years ago after her open heart surgery.  She denies nausea, vomiting, constipation, diarrhea or blood in her stool.  She would like a referral to general surgeon for surgical evaluation.  Review of Systems     Past Medical History:  Diagnosis Date   History of bicuspid aortic valve     with severe AS and dilated aortic root/arch.   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, 24 mm IMA shield vascular graft for the ascending aorta/arch), L carotid artery bypassing    Current Outpatient Medications  Medication Sig Dispense Refill   amLODipine (NORVASC) 10 MG tablet TAKE 1 TABLET BY MOUTH EVERY DAY 90 tablet 3   atorvastatin (LIPITOR) 40 MG tablet TAKE 1 TABLET BY MOUTH EVERYDAY AT BEDTIME 90 tablet 3   carvedilol (COREG) 3.125 MG tablet TAKE 1 TABLET BY MOUTH EVERY 12 HOURS 180 tablet 3   warfarin (COUMADIN) 5 MG tablet TAKE 1 TO 1 & 1/2 TABLETS BY MOUTH DAILY AS DIRECTED BY COUMADIN CLINIC 135 tablet 0   No current facility-administered medications for this visit.    Allergies  Allergen Reactions   Lisinopril     Family History  Problem Relation Age of Onset   Stroke Father    Hypertension Father    Breast cancer Maternal Grandmother    Lung cancer Maternal Grandmother     Social History   Socioeconomic History   Marital status: Significant Other    Spouse name: Not on file   Number of children: 1   Years of education: Not on file   Highest education level: Not on file  Occupational History    Employer: [employer]  Tobacco Use   Smoking status: Former    Types:  Cigarettes    Quit date: 01/01/2010    Years since quitting: 11.6   Smokeless tobacco: Never  Substance and Sexual Activity   Alcohol use: Yes    Alcohol/week: 30.0 standard drinks    Types: 12 Standard drinks or equivalent, 18 Cans of beer per week   Drug use: No   Sexual activity: Yes    Birth control/protection: Surgical  Other Topics Concern   Not on file  Social History Narrative   She is a divorced mother of one, grandmother 1. She usually exercises 4-5 days a week. Very active.   Quit smoking in 2011.   Takes 1-2 beers per day and is stable.   Social Determinants of Health   Financial Resource Strain: Not on file  Food Insecurity: Not on file  Transportation Needs: Not on file  Physical Activity: Not on file  Stress: Not on file  Social Connections: Not on file  Intimate Partner Violence: Not on file     Constitutional: Denies fever, malaise, fatigue, headache or abrupt weight changes.  Respiratory: Denies difficulty breathing, shortness of breath, cough or sputum production.   Cardiovascular: Denies chest pain, chest tightness, palpitations or swelling in the hands or feet.  Gastrointestinal: Patient reports epigastric mass.  Denies abdominal  pain, bloating, constipation, diarrhea or blood in the stool.   No other specific complaints in a complete review of systems (except as listed in HPI above).  Objective:   Physical Exam  BP 130/71 (BP Location: Left Arm, Patient Position: Sitting, Cuff Size: Small)   Pulse (!) 56   Temp (!) 97.5 F (36.4 C) (Temporal)   Resp 17   Ht 5' (1.524 m)   Wt 122 lb 12.8 oz (55.7 kg)   SpO2 99%   BMI 23.98 kg/m   Wt Readings from Last 3 Encounters:  02/09/21 122 lb 3.2 oz (55.4 kg)  02/06/21 120 lb (54.4 kg)  02/10/20 125 lb (56.7 kg)    General: Appears her stated age, well developed, well nourished in NAD. Skin: Warm, dry and intact.  HEENT: Head: normal shape and size;  Cardiovascular: Normal rate and rhythm.  Click  and murmur noted. Pulmonary/Chest: Normal effort and positive vesicular breath sounds. No respiratory distress. No wheezes, rales or ronchi noted.  Abdomen: Soft.  3 cm x 4 cm palpable mass noted in the epigastric region. Musculoskeletal: No difficulty with gait.  Neurological: Alert and oriented.   BMET    Component Value Date/Time   NA 140 02/06/2021 0929   K 4.2 02/06/2021 0929   CL 103 02/06/2021 0929   CO2 27 07/06/2011 1147   GLUCOSE 110 (H) 02/06/2021 0929   GLUCOSE 100 (H) 07/06/2011 1147   BUN 10 02/06/2021 0929   CREATININE 0.63 02/06/2021 0929   CALCIUM 9.2 02/06/2021 0929   GFRNONAA 99 02/06/2021 0929   GFRAA 114 02/06/2021 0929    Lipid Panel     Component Value Date/Time   CHOL 145 02/06/2021 0929   TRIG 77 02/06/2021 0929   HDL 70 02/06/2021 0929   CHOLHDL 2.1 02/06/2021 0929   LDLCALC 60 02/06/2021 0929    CBC    Component Value Date/Time   WBC 7.3 02/06/2021 0929   WBC 7.7 07/06/2011 1147   RBC 4.22 02/06/2021 0929   RBC 3.83 (L) 07/06/2011 1147   HGB 13.2 02/06/2021 0929   HCT 40.1 02/06/2021 0929   PLT 171 02/06/2021 0929   MCV 95 02/06/2021 0929   MCH 31.3 02/06/2021 0929   MCH 31.3 07/06/2011 1147   MCHC 32.9 02/06/2021 0929   MCHC 34.5 07/06/2011 1147   RDW 12.8 02/06/2021 0929   LYMPHSABS 1.4 02/06/2021 0929   MONOABS 0.7 07/06/2011 1147   EOSABS 0.2 02/06/2021 0929   BASOSABS 0.0 02/06/2021 0929    Hgb A1C Lab Results  Component Value Date   HGBA1C 5.9 (H) 02/06/2021            Assessment & Plan:  Abdominal Wall Hernia:  No evidence of strangulation She declines ultrasound for further evaluation at this time Referral to general surgery for further evaluation and treatment  Return precautions discussed  Nicki Reaper, NP This visit occurred during the SARS-CoV-2 public health emergency.  Safety protocols were in place, including screening questions prior to the visit, additional usage of staff PPE, and extensive  cleaning of exam room while observing appropriate contact time as indicated for disinfecting solutions.

## 2021-09-04 ENCOUNTER — Other Ambulatory Visit: Payer: Self-pay

## 2021-09-04 ENCOUNTER — Ambulatory Visit: Payer: Self-pay | Admitting: *Deleted

## 2021-09-04 VITALS — BP 112/66 | HR 66

## 2021-09-04 DIAGNOSIS — Z7901 Long term (current) use of anticoagulants: Secondary | ICD-10-CM

## 2021-09-04 DIAGNOSIS — Z952 Presence of prosthetic heart valve: Secondary | ICD-10-CM

## 2021-09-04 LAB — POCT INR: INR: 2.1 (ref 2.0–3.0)

## 2021-09-04 NOTE — Progress Notes (Signed)
Anticoag clinic CC'd on results.

## 2021-09-05 ENCOUNTER — Ambulatory Visit (INDEPENDENT_AMBULATORY_CARE_PROVIDER_SITE_OTHER): Payer: No Typology Code available for payment source | Admitting: Internal Medicine

## 2021-09-05 DIAGNOSIS — Z7901 Long term (current) use of anticoagulants: Secondary | ICD-10-CM | POA: Diagnosis not present

## 2021-09-05 LAB — PROTIME-INR
INR: 2.1 — ABNORMAL HIGH (ref 0.9–1.2)
Prothrombin Time: 21.4 s — ABNORMAL HIGH (ref 9.1–12.0)

## 2021-09-12 NOTE — Progress Notes (Signed)
Noted reviewed RN Rolly Salter note and patient had appt for follow up serum draw made

## 2021-09-20 ENCOUNTER — Telehealth: Payer: Self-pay

## 2021-09-20 DIAGNOSIS — K439 Ventral hernia without obstruction or gangrene: Secondary | ICD-10-CM | POA: Insufficient documentation

## 2021-09-20 NOTE — Telephone Encounter (Signed)
   [facility] Pre-operative Risk Assessment    Patient Name: Victoria [name]  DOB: 07/24/1961 MRN: [id number]  Request for surgical clearance:  What type of surgery is being performed Hernia surgery  When is this surgery scheduled TBD  What type of clearance is required Both  Are there any medications that need to be held prior to surgery and how long   Warfarin  Practice name and name of physician performing surgery  [facility] Dr.Paul Carolynne Edouard   What is the office phone number [phone]   7.   What is the office fax number        [phone]  8.   Anesthesia type  General   Victoria [name] 09/20/2021, 2:44 PM  _________________________________________________________________   (provider comments below)

## 2021-09-22 NOTE — Telephone Encounter (Signed)
Patient with diagnosis of mechanical St Judes aortic valve replacement on warfarin for anticoagulation.    Procedure: Hernia surgery Date of procedure: TBD  Per office protocol, patient can hold warfarin for 5 days prior to procedure.    Patient will NOT need bridging with Lovenox (enoxaparin) around procedure.

## 2021-09-22 NOTE — Telephone Encounter (Signed)
   Name: Victoria [name]  DOB: Oct 17, 1961  MRN: [id number]   Primary Cardiologist: Bryan Lemma, MD  Chart reviewed as part of pre-operative protocol coverage. Patient was contacted 09/22/2021 in reference to pre-operative risk assessment for pending surgery as outlined below.  Victoria [name] was last seen on 02/09/21 by Dr. Herbie Baltimore.  Since that day, Victoria [name] has done well. She has a history of mechanical aortic valve. She is able to complete more that 4.0 METS without angina.   Per our clinical pharmacist: Patient with diagnosis of mechanical St Judes aortic valve replacement on warfarin for anticoagulation.     Procedure: Hernia surgery Date of procedure: TBD   Per office protocol, patient can hold warfarin for 5 days prior to procedure.     Patient will NOT need bridging with Lovenox (enoxaparin) around procedure.  Therefore, based on ACC/AHA guidelines, the patient would be at acceptable risk for the planned procedure without further cardiovascular testing.   The patient was advised that if she develops new symptoms prior to surgery to contact our office to arrange for a follow-up visit, and she verbalized understanding.  I will route this recommendation to the requesting party via Epic fax function and remove from pre-op pool. Please call with questions.  Roe Rutherford Axavier Pressley, PA 09/22/2021, 1:49 PM

## 2021-09-22 NOTE — Telephone Encounter (Signed)
Left VM

## 2021-10-05 ENCOUNTER — Other Ambulatory Visit: Payer: Self-pay | Admitting: Cardiovascular Disease

## 2021-10-06 ENCOUNTER — Telehealth: Payer: Self-pay

## 2021-10-06 ENCOUNTER — Other Ambulatory Visit: Payer: Self-pay

## 2021-10-06 ENCOUNTER — Ambulatory Visit: Payer: Self-pay | Admitting: *Deleted

## 2021-10-06 VITALS — BP 119/71 | HR 63

## 2021-10-06 DIAGNOSIS — Z7901 Long term (current) use of anticoagulants: Secondary | ICD-10-CM

## 2021-10-06 DIAGNOSIS — Z952 Presence of prosthetic heart valve: Secondary | ICD-10-CM

## 2021-10-06 NOTE — Telephone Encounter (Signed)
Lpm regarding INR check and upcoming surgery 11/4.

## 2021-10-06 NOTE — Telephone Encounter (Signed)
I spoke to the patient who is getting INR checked today 10/14.  Will check for results 10/17.Marland Kitchen

## 2021-10-06 NOTE — Progress Notes (Signed)
Anticoag clinic CC'd on encounter. Pt instructed by anticoag to stop coumadin 10/30 ahead of hernia repair surgery 11/4.

## 2021-10-07 LAB — PROTIME-INR
INR: 2.8 — ABNORMAL HIGH (ref 0.9–1.2)
Prothrombin Time: 28.2 s — ABNORMAL HIGH (ref 9.1–12.0)

## 2021-10-09 ENCOUNTER — Ambulatory Visit (INDEPENDENT_AMBULATORY_CARE_PROVIDER_SITE_OTHER): Payer: No Typology Code available for payment source | Admitting: Cardiology

## 2021-10-09 DIAGNOSIS — Z7901 Long term (current) use of anticoagulants: Secondary | ICD-10-CM

## 2021-10-09 NOTE — Progress Notes (Signed)
Reviewed RN Rolly Salter note; Epic patient has hernia repair scheduled.  Agreed with plan of care.

## 2021-10-09 NOTE — Progress Notes (Signed)
Results already reviewed with pt by anticoag clinic. No change to dosing aside from stopping 10/30-11/4 for surgery. Next check around 11/11 but depends on how pt feels one week post-op. She plans to come to clinic if she feels up to it. If not, will check the following week once able to tolerate.

## 2021-10-20 NOTE — Pre-Procedure Instructions (Signed)
Surgical Instructions    Your procedure is scheduled on Friday, 10/27/21.  Report to [facility] Main Entrance "A" at 09:15 A.M., then check in with the Admitting office.  Call this number if you have problems the morning of surgery:  [phone]   If you have any questions prior to your surgery date call [phone]: Open Monday-Friday 8am-4pm    Remember:  Do not eat after midnight the night before your surgery  You may drink clear liquids until 08:15 A.M. the morning of your surgery.   Clear liquids allowed are: Water, Non-Citrus Juices (without pulp), Carbonated Beverages, Clear Tea, Black Coffee ONLY (NO MILK, CREAM OR POWDERED CREAMER of any kind), and Gatorade    Take these medicines the morning of surgery with A SIP OF WATER:  amLODipine (NORVASC)  carvedilol (COREG)  Per instruction of your anticoagulant clinic, HOLD warfarin (COUMADIN) starting 10/22/21.  As of today, STOP taking any Aspirin (unless otherwise instructed by your surgeon) Aleve, Naproxen, Ibuprofen, Motrin, Advil, Goody's, BC's, all herbal medications, fish oil, and all vitamins.   HOW TO MANAGE YOUR DIABETES BEFORE AND AFTER SURGERY  Why is it important to control my blood sugar before and after surgery? Improving blood sugar levels before and after surgery helps healing and can limit problems. A way of improving blood sugar control is eating a healthy diet by:  Eating less sugar and carbohydrates  Increasing activity/exercise  Talking with your doctor about reaching your blood sugar goals High blood sugars (greater than 180 mg/dL) can raise your risk of infections and slow your recovery, so you will need to focus on controlling your diabetes during the weeks before surgery. Make sure that the doctor who takes care of your diabetes knows about your planned surgery including the date and location.  How do I manage my blood sugar before surgery? Check your blood sugar at least 4 times a day, starting 2  days before surgery, to make sure that the level is not too high or low.  Check your blood sugar the morning of your surgery when you wake up and every 2 hours until you get to the Short Stay unit.  If your blood sugar is less than 70 mg/dL, you will need to treat for low blood sugar: Treat a low blood sugar (less than 70 mg/dL) with  cup of clear juice (cranberry or apple), 4 glucose tablets, OR glucose gel. Recheck blood sugar in 15 minutes after treatment (to make sure it is greater than 70 mg/dL). If your blood sugar is not greater than 70 mg/dL on recheck, call [phone] for further instructions. Report your blood sugar to the short stay nurse when you get to Short Stay.  If you are admitted to the hospital after surgery: Your blood sugar will be checked by the staff and you will probably be given insulin after surgery (instead of oral diabetes medicines) to make sure you have good blood sugar levels. The goal for blood sugar control after surgery is 80-180 mg/dL.   After your COVID test   You are not required to quarantine however you are required to wear a well-fitting mask when you are out and around people not in your household.  If your mask becomes wet or soiled, replace with a new one.  Wash your hands often with soap and water for 20 seconds or clean your hands with an alcohol-based hand sanitizer that contains at least 60% alcohol.  Do not share personal items.  Notify your provider: if  you are in close contact with someone who has COVID  or if you develop a fever of 100.4 or greater, sneezing, cough, sore throat, shortness of breath or body aches.           Do not wear jewelry or makeup Do not wear lotions, powders, perfumes, or deodorant. Do not shave 48 hours prior to surgery. Do not bring valuables to the hospital. DO Not wear nail polish, gel polish, artificial nails, or any other type of covering on natural nails including finger and toenails. If patients have  artificial nails, gel coating, etc. that need to be removed by a nail salon, please have this removed prior to surgery or surgery may need to be canceled/delayed if the surgeon/ anesthesia feels like the patient is unable to be adequately monitored.             [facility] is not responsible for any belongings or valuables.  Do NOT Smoke (Tobacco/Vaping)  24 hours prior to your procedure  If you use a CPAP at night, you may bring your mask for your overnight stay.   Contacts, glasses, hearing aids, dentures or partials may not be worn into surgery, please bring cases for these belongings   For patients admitted to the hospital, discharge time will be determined by your treatment team.   Patients discharged the day of surgery will not be allowed to drive home, and someone needs to stay with them for 24 hours.  NO VISITORS WILL BE ALLOWED IN PRE-OP WHERE PATIENTS ARE PREPPED FOR SURGERY.  ONLY 1 SUPPORT PERSON MAY BE PRESENT IN THE WAITING ROOM WHILE YOU ARE IN SURGERY.  IF YOU ARE TO BE ADMITTED, ONCE YOU ARE IN YOUR ROOM YOU WILL BE ALLOWED TWO (2) VISITORS. 1 (ONE) VISITOR MAY STAY OVERNIGHT BUT MUST ARRIVE TO THE ROOM BY 8pm.  Minor children may have two parents present. Special consideration for safety and communication needs will be reviewed on a case by case basis.  Special instructions:    Oral Hygiene is also important to reduce your risk of infection.  Remember - BRUSH YOUR TEETH THE MORNING OF SURGERY WITH YOUR REGULAR TOOTHPASTE   [place]- Preparing For Surgery  Before surgery, you can play an important role. Because skin is not sterile, your skin needs to be as free of germs as possible. You can reduce the number of germs on your skin by washing with CHG (chlorahexidine gluconate) Soap before surgery.  CHG is an antiseptic cleaner which kills germs and bonds with the skin to continue killing germs even after washing.     Please do not use if you have an allergy to CHG or  antibacterial soaps. If your skin becomes reddened/irritated stop using the CHG.  Do not shave (including legs and underarms) for at least 48 hours prior to first CHG shower. It is OK to shave your face.  Please follow these instructions carefully.     Shower the NIGHT BEFORE SURGERY and the MORNING OF SURGERY with CHG Soap.   If you chose to wash your hair, wash your hair first as usual with your normal shampoo. After you shampoo, rinse your hair and body thoroughly to remove the shampoo.  Then [employer] and genitals (private parts) with your normal soap and rinse thoroughly to remove soap.  After that Use CHG Soap as you would any other liquid soap. You can apply CHG directly to the skin and wash gently with a scrungie or a clean  washcloth.   Apply the CHG Soap to your body ONLY FROM THE NECK DOWN.  Do not use on open wounds or open sores. Avoid contact with your eyes, ears, mouth and genitals (private parts). Wash Face and genitals (private parts)  with your normal soap.   Wash thoroughly, paying special attention to the area where your surgery will be performed.  Thoroughly rinse your body with warm water from the neck down.  DO NOT shower/wash with your normal soap after using and rinsing off the CHG Soap.  Pat yourself dry with a CLEAN TOWEL.  Wear CLEAN PAJAMAS to bed the night before surgery  Place CLEAN SHEETS on your bed the night before your surgery  DO NOT SLEEP WITH PETS.   Day of Surgery:  Take a shower with CHG soap. Wear Clean/Comfortable clothing the morning of surgery Do not apply any deodorants/lotions.   Remember to brush your teeth WITH YOUR REGULAR TOOTHPASTE.   Please read over the following fact sheets that you were given.

## 2021-10-23 ENCOUNTER — Other Ambulatory Visit: Payer: Self-pay

## 2021-10-23 ENCOUNTER — Encounter (HOSPITAL_COMMUNITY)
Admission: RE | Admit: 2021-10-23 | Discharge: 2021-10-23 | Disposition: A | Discharge status: Home / Self Care | Payer: No Typology Code available for payment source | Source: Ambulatory Visit | Attending: General Surgery | Admitting: General Surgery

## 2021-10-23 ENCOUNTER — Encounter (HOSPITAL_COMMUNITY): Payer: Self-pay

## 2021-10-23 VITALS — BP 152/79 | HR 64 | Temp 98.2°F | Resp 18 | Ht 60.5 in | Wt 124.5 lb

## 2021-10-23 DIAGNOSIS — R7303 Prediabetes: Secondary | ICD-10-CM | POA: Insufficient documentation

## 2021-10-23 DIAGNOSIS — Z01818 Encounter for other preprocedural examination: Secondary | ICD-10-CM | POA: Insufficient documentation

## 2021-10-23 DIAGNOSIS — I10 Essential (primary) hypertension: Secondary | ICD-10-CM

## 2021-10-23 LAB — COMPREHENSIVE METABOLIC PANEL
ALT: 21 U/L (ref 0–44)
AST: 29 U/L (ref 15–41)
Albumin: 4.4 g/dL (ref 3.5–5.0)
Alkaline Phosphatase: 69 U/L (ref 38–126)
Anion gap: 7 (ref 5–15)
BUN: 16 mg/dL (ref 6–20)
CO2: 27 mmol/L (ref 22–32)
Calcium: 9.4 mg/dL (ref 8.9–10.3)
Chloride: 106 mmol/L (ref 98–111)
Creatinine, Ser: 0.64 mg/dL (ref 0.44–1.00)
GFR, Estimated: >60 mL/min (ref >60)
Glucose, Bld: 102 mg/dL — ABNORMAL HIGH (ref 70–99)
Potassium: 4 mmol/L (ref 3.5–5.1)
Sodium: 140 mmol/L (ref 135–145)
Total Bilirubin: 2.4 mg/dL — ABNORMAL HIGH (ref 0.3–1.2)
Total Protein: 7.3 g/dL (ref 6.5–8.1)

## 2021-10-23 LAB — CBC
HCT: 39.2 % (ref 36.0–46.0)
Hemoglobin: 12.8 g/dL (ref 12.0–15.0)
MCH: 30.8 pg (ref 26.0–34.0)
MCHC: 32.7 g/dL (ref 30.0–36.0)
MCV: 94.2 fL (ref 80.0–100.0)
Platelets: 193 10*3/uL (ref 150–400)
RBC: 4.16 MIL/uL (ref 3.87–5.11)
RDW: 13.4 % (ref 11.5–15.5)
WBC: 7.1 10*3/uL (ref 4.0–10.5)
nRBC: 0 % (ref 0.0–0.2)

## 2021-10-23 LAB — HEMOGLOBIN A1C
Hgb A1c MFr Bld: 5.7 % — ABNORMAL HIGH (ref 4.8–5.6)
Mean Plasma Glucose: 116.89 mg/dL

## 2021-10-23 NOTE — Progress Notes (Signed)
PCP - Nicki Reaper, NP Cardiologist - Dr. Herbie Baltimore  PPM/ICD - n/a  Chest x-ray - n/a EKG - 02/09/21 Stress Test - denies ECHO - 01/27/21 Cardiac Cath -12/21/09   Sleep Study - denies CPAP - denies  Pt denies being diabetic. Does not check CBG at home. Not on any diabetic medications. Past A1C levels indicate pre-diabetes. Will collect A1C level today.   Blood Thinner Instructions: Coumadin, LD 10/21/21. Will need PT/INR day of surgery. Aspirin Instructions: n/a   ERAS Protcol -clear liquids until 0815 DOS. PRE-SURGERY Ensure or G2- none ordered.  COVID TEST- Ambulatory surgery. Not indicated.   Anesthesia review: Yes, hx of mechanical heart valve.  Patient denies shortness of breath, fever, cough and chest pain at PAT appointment   All instructions explained to the patient, with a verbal understanding of the material. Patient agrees to go over the instructions while at home for a better understanding. Patient also instructed to self quarantine after being tested for COVID-19. The opportunity to ask questions was provided.

## 2021-10-24 ENCOUNTER — Ambulatory Visit: Payer: Self-pay | Admitting: General Surgery

## 2021-10-24 NOTE — Progress Notes (Signed)
Anesthesia Chart Review:  Case: [id number] Date/Time: 10/27/21 1100   Procedure: HERNIA REPAIR VENTRAL WITH MESH   Anesthesia type: General   Pre-op diagnosis: VENTRAL HERNIA   Location: [place] OR ROOM 08 / [place] OR   Surgeons: Jovita Kussmaul, MD       DISCUSSION: Patient is a 60 year old female scheduled for the above procedure. By notes, she has a small epigastric ventral hernia likely from one of her drains from her 2011 cardiac surgery, and it has been causing her discomfort.   History includes former smoker (quit 01/01/10), HTN, HLD, ascending thoracic aortic aneurysm and bicuspid AV with severe AS/moderate AI (s/p Bentall procedure/replacement of aortic arch using 24 mm Hemashield graft with aorto-left common carotid bypass using 8 mm Hemashield graft, aorta-innominate bypass using 10 mm Hemashield graft, replacement of AV using 21 mm St. Jude mechanical AVR, reimplantation of coronaries arteries into graft 01/02/10), hysterectomy (with BSO, LOA, drainage of pelvis abscess 08/11/02). A1c results since at least 2020 have been consistent with pre-diabetes.   Preoperative cardiology input outlined on 09/22/21 by Fabian Sharp, PA, "LESHAY DESAULNIERS was last seen on 02/09/21 by Dr. Ellyn Hack.  Since that day, CHRISTIANA GUREVICH has done well. She has a history of mechanical aortic valve. She is able to complete more that 4.0 METS without angina.Marland KitchenMarland KitchenPatient with diagnosis of mechanical St Judes aortic valve replacement on warfarin for anticoagulation...patient can hold warfarin for 5 days prior to procedure.     Patient will NOT need bridging with Lovenox (enoxaparin) around procedure.   Therefore, based on ACC/AHA guidelines, the patient would be at acceptable risk for the planned procedure without further cardiovascular testing." INR 2.8 on 10/06/21. Last warfarin 10/21/21.   10/23/21 lab results routed to Dr. Marlou Starks for review. She is for PT/INR on arrival for surgery.  Anesthesia team to evaluate on the day of  surgery.   VS: BP (!) 152/79   Pulse 64   Temp 36.8 C (Oral)   Resp 18   Ht 5' 0.5" (1.537 m)   Wt 56.5 kg   SpO2 96%   BMI 23.91 kg/m    PROVIDERS: Jearld Fenton, NP is PCP Glenetta Hew, MD is cardiologist Gilford Raid, MD is CT surgeon   LABS: Preoperative labs noted. Total bilirubin 2.4 with normal AST, ALT, alk phos, and PLT count. Labs routed to Dr. Marlou Starks for review.   (all labs ordered are listed, but only abnormal results are displayed)  Labs Reviewed  HEMOGLOBIN A1C - Abnormal; Notable for the following components:      Result Value   Hgb A1c MFr Bld 5.7 (*)    All other components within normal limits  COMPREHENSIVE METABOLIC PANEL - Abnormal; Notable for the following components:   Glucose, Bld 102 (*)    Total Bilirubin 2.4 (*)    All other components within normal limits  CBC    EKG: 02/09/21: NSR. Non-specific T wave abnormality.    CV: Echo 01/27/21: IMPRESSIONS   1. Left ventricular ejection fraction, by estimation, is 60 to 65%. The  left ventricle has normal function. The left ventricle has no regional  wall motion abnormalities. Left ventricular diastolic parameters are  indeterminate in the setting of mitral  annular calcification, but suggestives of increase left atrial pressure.   2. Right ventricular systolic function is normal. The right ventricular  size is normal. There is normal pulmonary artery systolic pressure. The  estimated right ventricular systolic pressure is 29.7 mmHg.   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 Trivial mitral valve regurgitation.   4. The aortic valve has been repaired/replaced. Aortic valve  regurgitation is mild. There is a 21 mm St. Jude mechanical valve present  in the aortic position.   5. Ascending aorta and root size normal. Aortic root/ascending aorta has  been repaired/replaced.  - Comparison(s): A prior study was performed on 01/13/2018. Similar  prosthetic valve parameters from prior, noted in the body  of the report.  Mild, eccentric aortic regurgitation is new from prior, without LV  dilation or dysfunction. Incidentally, papillary  muscle appears more calcified from prior study.    Last cardiac cath was pre-Bentall Procedure and done on 12/21/09 showing coronaries free of disease, markedly dilated aortic root with severe AS, 2-3+ AI. S/p Bentall Procedure with mechanical AVR 01/02/10.   Past Medical History:  Diagnosis Date   History of bicuspid aortic valve     with severe AS and dilated aortic root/arch.   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 Jude 7m mechanical valve, Bentall procesure & L cartoid artery bypassing    BENTALL PROCEDURE  12/2009   See above   CARDIAC CATHETERIZATION  11/2009   No significant coronary disease. Normal LV function.   TRANSTHORACIC ECHOCARDIOGRAM  03/2013   Normal LV size and thickness. EF 65-70%.; Grade 3 diastolic dysfunction with elevated LAP; 21 mm tilting-disc St. Jude valve functioning normally. No pannus. Mild-moderate stenosis with velocities higher than anticipated for valve type. Peak gradient 35-39 mmHg, mean 20-24 mmHg.   TRANSTHORACIC ECHOCARDIOGRAM  1/'16, 1/19   a) 12/2014: Normal EF (60-65%), Normal wall motion. 21 mm St Jude Mech AoV Prosthesis- well seated, mild AI (not peri-valvular leak as originally noted). Peak-Mean Gradients: 35 mmHg & 22 mHg (likely size related).  Stable;; b) 12/2017: EF 60-65%.  Mild LVH.  Prosthetic aortic valve opens well.  Peak-mean gradients 36 and 19 mmHg respectively.  Appropriate for prosthesis.  Mild MR.  Trivial AI.    MEDICATIONS:  amLODipine (NORVASC) 10  MG tablet   atorvastatin (LIPITOR) 40 MG tablet   carvedilol (COREG) 3.125 MG tablet   warfarin (COUMADIN) 5 MG tablet   No current facility-administered medications for this encounter.    AMyra Gianotti PA-C Surgical Short Stay/Anesthesiology MBurlington County Endoscopy Center LLCPhone [phone] Hermann Specialty Hospital KingwoodPhone [phone]/12/2020 4:41 PM

## 2021-10-24 NOTE — Anesthesia Preprocedure Evaluation (Addendum)
Anesthesia Evaluation  Patient identified by MRN, date of birth, ID band Patient awake    Reviewed: Allergy & Precautions, NPO status , Patient's Chart, lab work & pertinent test results  History of Anesthesia Complications Negative for: history of anesthetic complications  Airway Mallampati: II  TM Distance: >3 FB Neck ROM: Full    Dental  (+) Teeth Intact, Dental Advisory Given   Pulmonary neg pulmonary ROS, former smoker,    Pulmonary exam normal        Cardiovascular Exercise Tolerance: Good hypertension, Normal cardiovascular exam+ Valvular Problems/Murmurs (ascending thoracic aortic aneurysm and bicuspid AV with severe AS/moderate AI s/p aortic root replacement/AVR) AS      Neuro/Psych negative neurological ROS     GI/Hepatic negative GI ROS, Neg liver ROS,   Endo/Other  negative endocrine ROS  Renal/GU negative Renal ROS  negative genitourinary   Musculoskeletal negative musculoskeletal ROS (+)   Abdominal   Peds  Hematology Warfarin   Anesthesia Other Findings  Echo 01/27/21: IMPRESSIONS  1. Left ventricular ejection fraction, by estimation, is 60 to 65%. The  left ventricle has normal function. The left ventricle has no regional  wall motion abnormalities. Left ventricular diastolic parameters are  indeterminate in the setting of mitral  annular calcification, but suggestives of increase left atrial pressure.  2. Right ventricular systolic function is normal. The right ventricular  size is normal. There is normal pulmonary artery systolic pressure. The  estimated right ventricular systolic pressure is 29.0 mmHg.  3. The mitral valve is myxomatous. Trivial mitral valve regurgitation.  4. The aortic valve has been repaired/replaced. Aortic valve  regurgitation is mild. There is a 21 mm St. Jude mechanical valve present  in the aortic position.  5. Ascending aorta and root size normal. Aortic  root/ascending aorta has  been repaired/replaced.  - Comparison(s): A prior study was performed on 01/13/2018. Similar  prosthetic valve parameters from prior, noted in the body of the report.  Mild, eccentric aortic regurgitation is new from prior, without LV  dilation or dysfunction. Incidentally, papillary  muscle appears more calcified from prior study.   Reproductive/Obstetrics                            Anesthesia Physical Anesthesia Plan  ASA: 3  Anesthesia Plan: General   Post-op Pain Management:    Induction: Intravenous  PONV Risk Score and Plan: 3 and Ondansetron, Dexamethasone, Treatment may vary due to age or medical condition and Midazolam  Airway Management Planned: Oral ETT  Additional Equipment: None  Intra-op Plan:   Post-operative Plan: Extubation in OR  Informed Consent: I have reviewed the patients History and Physical, chart, labs and discussed the procedure including the risks, benefits and alternatives for the proposed anesthesia with the patient or authorized representative who has indicated his/her understanding and acceptance.     Dental advisory given  Plan Discussed with:   Anesthesia Plan Comments: (PAT note written 10/24/2021 by Shonna Chock, PA-C. )       Anesthesia Quick Evaluation

## 2021-10-27 ENCOUNTER — Ambulatory Visit (HOSPITAL_COMMUNITY): Payer: No Typology Code available for payment source | Admitting: Anesthesiology

## 2021-10-27 ENCOUNTER — Encounter (HOSPITAL_COMMUNITY): Payer: Self-pay | Admitting: General Surgery

## 2021-10-27 ENCOUNTER — Encounter (HOSPITAL_COMMUNITY)
Admission: RE | Disposition: A | Discharge status: Home / Self Care | Payer: Self-pay | Source: Home / Self Care | Attending: General Surgery

## 2021-10-27 ENCOUNTER — Ambulatory Visit (HOSPITAL_COMMUNITY)
Admission: RE | Admit: 2021-10-27 | Discharge: 2021-10-27 | Disposition: A | Discharge status: Home / Self Care | Payer: No Typology Code available for payment source | Attending: General Surgery | Admitting: General Surgery

## 2021-10-27 ENCOUNTER — Ambulatory Visit (HOSPITAL_COMMUNITY): Payer: No Typology Code available for payment source | Admitting: Vascular Surgery

## 2021-10-27 ENCOUNTER — Other Ambulatory Visit: Payer: Self-pay

## 2021-10-27 DIAGNOSIS — Z7901 Long term (current) use of anticoagulants: Secondary | ICD-10-CM | POA: Insufficient documentation

## 2021-10-27 DIAGNOSIS — Z79899 Other long term (current) drug therapy: Secondary | ICD-10-CM | POA: Insufficient documentation

## 2021-10-27 DIAGNOSIS — Z87891 Personal history of nicotine dependence: Secondary | ICD-10-CM | POA: Insufficient documentation

## 2021-10-27 DIAGNOSIS — K439 Ventral hernia without obstruction or gangrene: Secondary | ICD-10-CM | POA: Diagnosis present

## 2021-10-27 DIAGNOSIS — Z952 Presence of prosthetic heart valve: Secondary | ICD-10-CM | POA: Diagnosis not present

## 2021-10-27 HISTORY — PX: VENTRAL HERNIA REPAIR: SHX424

## 2021-10-27 HISTORY — PX: INSERTION OF MESH: SHX5868

## 2021-10-27 LAB — GLUCOSE, CAPILLARY: Glucose-Capillary: 117 mg/dL — ABNORMAL HIGH (ref 70–99)

## 2021-10-27 LAB — PROTIME-INR
INR: 1 (ref 0.8–1.2)
Prothrombin Time: 12.8 seconds (ref 11.4–15.2)

## 2021-10-27 SURGERY — REPAIR, HERNIA, VENTRAL
Anesthesia: General | Site: Abdomen

## 2021-10-27 MED ORDER — BUPIVACAINE-EPINEPHRINE (PF) 0.25% -1:200000 IJ SOLN
INTRAMUSCULAR | Status: AC
  Filled 2021-10-27: qty 30

## 2021-10-27 MED ORDER — 0.9 % SODIUM CHLORIDE (POUR BTL) OPTIME
TOPICAL | Status: DC | PRN
  Administered 2021-10-27: 1000 mL

## 2021-10-27 MED ORDER — CEFAZOLIN SODIUM-DEXTROSE 2-4 GM/100ML-% IV SOLN
2.0000 g | INTRAVENOUS | Status: AC
  Administered 2021-10-27: 2 g via INTRAVENOUS

## 2021-10-27 MED ORDER — MIDAZOLAM HCL 5 MG/5ML IJ SOLN
INTRAMUSCULAR | Status: DC | PRN
Start: 2021-10-27 — End: 2021-10-27
  Administered 2021-10-27: 2 mg via INTRAVENOUS

## 2021-10-27 MED ORDER — CELECOXIB 200 MG PO CAPS
ORAL_CAPSULE | ORAL | Status: AC
  Administered 2021-10-27: 200 mg via ORAL
  Filled 2021-10-27: qty 1

## 2021-10-27 MED ORDER — CHLORHEXIDINE GLUCONATE CLOTH 2 % EX PADS: 6.0000 | MEDICATED_PAD | Freq: Once | CUTANEOUS | Status: DC

## 2021-10-27 MED ORDER — ESMOLOL HCL 100 MG/10ML IV SOLN
INTRAVENOUS | Status: AC
  Filled 2021-10-27: qty 10

## 2021-10-27 MED ORDER — ACETAMINOPHEN 500 MG PO TABS
ORAL_TABLET | ORAL | Status: AC
  Administered 2021-10-27: 1000 mg via ORAL
  Filled 2021-10-27: qty 2

## 2021-10-27 MED ORDER — ROCURONIUM BROMIDE 10 MG/ML (PF) SYRINGE
PREFILLED_SYRINGE | INTRAVENOUS | Status: DC | PRN
Start: 2021-10-27 — End: 2021-10-27
  Administered 2021-10-27: 50 mg via INTRAVENOUS

## 2021-10-27 MED ORDER — FENTANYL CITRATE (PF) 250 MCG/5ML IJ SOLN
INTRAMUSCULAR | Status: AC
  Filled 2021-10-27: qty 5

## 2021-10-27 MED ORDER — ROCURONIUM BROMIDE 10 MG/ML (PF) SYRINGE
PREFILLED_SYRINGE | INTRAVENOUS | Status: AC
  Filled 2021-10-27: qty 20

## 2021-10-27 MED ORDER — GABAPENTIN 300 MG PO CAPS: 300.0000 mg | ORAL_CAPSULE | ORAL | Status: AC

## 2021-10-27 MED ORDER — MIDAZOLAM HCL 2 MG/2ML IJ SOLN
INTRAMUSCULAR | Status: AC
  Filled 2021-10-27: qty 2

## 2021-10-27 MED ORDER — PROPOFOL 10 MG/ML IV BOLUS
INTRAVENOUS | Status: DC | PRN
  Administered 2021-10-27: 120 mg via INTRAVENOUS

## 2021-10-27 MED ORDER — EPHEDRINE SULFATE-NACL 50-0.9 MG/10ML-% IV SOSY
PREFILLED_SYRINGE | INTRAVENOUS | Status: DC | PRN
  Administered 2021-10-27: 10 mg via INTRAVENOUS
  Administered 2021-10-27: 5 mg via INTRAVENOUS
  Administered 2021-10-27: 10 mg via INTRAVENOUS

## 2021-10-27 MED ORDER — LACTATED RINGERS IV SOLN: INTRAVENOUS | Status: DC

## 2021-10-27 MED ORDER — LIDOCAINE 2% (20 MG/ML) 5 ML SYRINGE
INTRAMUSCULAR | Status: AC
  Filled 2021-10-27: qty 20

## 2021-10-27 MED ORDER — CEFAZOLIN SODIUM-DEXTROSE 2-4 GM/100ML-% IV SOLN
INTRAVENOUS | Status: AC
  Filled 2021-10-27: qty 100

## 2021-10-27 MED ORDER — SUGAMMADEX SODIUM 200 MG/2ML IV SOLN
INTRAVENOUS | Status: DC | PRN
  Administered 2021-10-27 (×2): 100 mg via INTRAVENOUS

## 2021-10-27 MED ORDER — PHENYLEPHRINE 40 MCG/ML (10ML) SYRINGE FOR IV PUSH (FOR BLOOD PRESSURE SUPPORT)
PREFILLED_SYRINGE | INTRAVENOUS | Status: AC
  Filled 2021-10-27: qty 10

## 2021-10-27 MED ORDER — CELECOXIB 200 MG PO CAPS: 200.0000 mg | ORAL_CAPSULE | ORAL | Status: AC

## 2021-10-27 MED ORDER — ORAL CARE MOUTH RINSE: 15.0000 mL | Freq: Once | OROMUCOSAL | Status: AC

## 2021-10-27 MED ORDER — ACETAMINOPHEN 500 MG PO TABS: 1000.0000 mg | ORAL_TABLET | ORAL | Status: AC

## 2021-10-27 MED ORDER — HYDROCODONE-ACETAMINOPHEN 5-325 MG PO TABS: 1.0000 | ORAL_TABLET | Freq: Four times a day (QID) | ORAL | 0 refills | Status: DC | PRN

## 2021-10-27 MED ORDER — GABAPENTIN 300 MG PO CAPS
ORAL_CAPSULE | ORAL | Status: AC
  Administered 2021-10-27: 300 mg via ORAL
  Filled 2021-10-27: qty 1

## 2021-10-27 MED ORDER — CHLORHEXIDINE GLUCONATE 0.12 % MT SOLN: 15.0000 mL | Freq: Once | OROMUCOSAL | Status: AC

## 2021-10-27 MED ORDER — FENTANYL CITRATE (PF) 250 MCG/5ML IJ SOLN
INTRAMUSCULAR | Status: DC | PRN
  Administered 2021-10-27: 100 ug via INTRAVENOUS

## 2021-10-27 MED ORDER — CHLORHEXIDINE GLUCONATE 0.12 % MT SOLN
OROMUCOSAL | Status: AC
  Administered 2021-10-27: 15 mL via OROMUCOSAL
  Filled 2021-10-27: qty 15

## 2021-10-27 MED ORDER — BUPIVACAINE-EPINEPHRINE 0.25% -1:200000 IJ SOLN
INTRAMUSCULAR | Status: DC | PRN
  Administered 2021-10-27: 10 mL

## 2021-10-27 MED ORDER — DEXAMETHASONE SODIUM PHOSPHATE 10 MG/ML IJ SOLN
INTRAMUSCULAR | Status: DC | PRN
  Administered 2021-10-27: 4 mg via INTRAVENOUS

## 2021-10-27 MED ORDER — ONDANSETRON HCL 4 MG/2ML IJ SOLN
INTRAMUSCULAR | Status: DC | PRN
  Administered 2021-10-27: 4 mg via INTRAVENOUS

## 2021-10-27 MED ORDER — LIDOCAINE 2% (20 MG/ML) 5 ML SYRINGE
INTRAMUSCULAR | Status: DC | PRN
  Administered 2021-10-27: 100 mg via INTRAVENOUS

## 2021-10-27 SURGICAL SUPPLY — 36 items
BAG COUNTER SPONGE SURGICOUNT (BAG) ×3 IMPLANT
BLADE CLIPPER SURG (BLADE) IMPLANT
CANISTER SUCT 3000ML PPV (MISCELLANEOUS) ×3 IMPLANT
CHLORAPREP W/TINT 26 (MISCELLANEOUS) ×3 IMPLANT
COVER SURGICAL LIGHT HANDLE (MISCELLANEOUS) ×3 IMPLANT
DERMABOND ADVANCED (GAUZE/BANDAGES/DRESSINGS) ×1
DERMABOND ADVANCED .7 DNX12 (GAUZE/BANDAGES/DRESSINGS) IMPLANT
DRAPE LAPAROSCOPIC ABDOMINAL (DRAPES) ×3 IMPLANT
ELECT REM PT RETURN 9FT ADLT (ELECTROSURGICAL) ×3
ELECTRODE REM PT RTRN 9FT ADLT (ELECTROSURGICAL) ×2 IMPLANT
GAUZE SPONGE 4X4 12PLY STRL LF (GAUZE/BANDAGES/DRESSINGS) IMPLANT
GLOVE SURG ENC MOIS LTX SZ7.5 (GLOVE) ×3 IMPLANT
GOWN STRL REUS W/ TWL LRG LVL3 (GOWN DISPOSABLE) ×4 IMPLANT
GOWN STRL REUS W/TWL LRG LVL3 (GOWN DISPOSABLE) ×6
KIT BASIN OR (CUSTOM PROCEDURE TRAY) ×3 IMPLANT
KIT TURNOVER KIT B (KITS) ×3 IMPLANT
MESH VENTRALEX ST 1-7/10 CRC S (Mesh General) ×1 IMPLANT
NDL HYPO 25GX1X1/2 BEV (NEEDLE) ×2 IMPLANT
NEEDLE HYPO 25GX1X1/2 BEV (NEEDLE) ×3 IMPLANT
NS IRRIG 1000ML POUR BTL (IV SOLUTION) ×3 IMPLANT
PACK GENERAL/GYN (CUSTOM PROCEDURE TRAY) ×3 IMPLANT
PAD ARMBOARD 7.5X6 YLW CONV (MISCELLANEOUS) ×3 IMPLANT
PENCIL SMOKE EVACUATOR (MISCELLANEOUS) ×3 IMPLANT
STAPLER VISISTAT 35W (STAPLE) IMPLANT
SUT MNCRL AB 4-0 PS2 18 (SUTURE) ×1 IMPLANT
SUT NOVA NAB DX-16 0-1 5-0 T12 (SUTURE) ×1 IMPLANT
SUT SILK 2 0 (SUTURE) ×3
SUT SILK 2-0 18XBRD TIE 12 (SUTURE) ×2 IMPLANT
SUT VIC AB 2-0 SH 18 (SUTURE) IMPLANT
SUT VIC AB 3-0 SH 27 (SUTURE) ×3
SUT VIC AB 3-0 SH 27X BRD (SUTURE) IMPLANT
SYR CONTROL 10ML LL (SYRINGE) ×3 IMPLANT
TOWEL GREEN STERILE (TOWEL DISPOSABLE) ×3 IMPLANT
TOWEL GREEN STERILE FF (TOWEL DISPOSABLE) ×3 IMPLANT
TRAY FOLEY W/BAG SLVR 16FR (SET/KITS/TRAYS/PACK)
TRAY FOLEY W/BAG SLVR 16FR ST (SET/KITS/TRAYS/PACK) IMPLANT

## 2021-10-27 NOTE — Interval H&P Note (Signed)
History and Physical Interval Note:  10/27/2021 10:30 AM  Victoria [name]  has presented today for surgery, with the diagnosis of VENTRAL HERNIA.  The various methods of treatment have been discussed with the patient and family. After consideration of risks, benefits and other options for treatment, the patient has consented to  Procedure(s): HERNIA REPAIR VENTRAL WITH MESH (N/A) as a surgical intervention.  The patient's history has been reviewed, patient examined, no change in status, stable for surgery.  I have reviewed the patient's chart and labs.  Questions were answered to the patient's satisfaction.     Chevis Pretty III

## 2021-10-27 NOTE — Anesthesia Procedure Notes (Signed)
Procedure Name: Intubation Date/Time: 10/27/2021 10:59 AM Performed by: Adria Dill, CRNA Pre-anesthesia Checklist: Patient identified, Emergency Drugs available, Suction available and Patient being monitored Patient Re-evaluated:Patient Re-evaluated prior to induction Oxygen Delivery Method: Circle system utilized Preoxygenation: Pre-oxygenation with 100% oxygen Induction Type: IV induction Ventilation: Mask ventilation without difficulty Laryngoscope Size: Miller and 3 Grade View: Grade I Tube type: Oral Tube size: 7.0 mm Number of attempts: 1 Airway Equipment and Method: Stylet and Oral airway Placement Confirmation: ETT inserted through vocal cords under direct vision, positive ETCO2 and breath sounds checked- equal and bilateral Secured at: 21 cm Tube secured with: Tape Dental Injury: Teeth and Oropharynx as per pre-operative assessment

## 2021-10-27 NOTE — Op Note (Signed)
10/27/2021  11:27 AM  PATIENT:  Victoria [name]  60 y.o. female  PRE-OPERATIVE DIAGNOSIS:  VENTRAL HERNIA  POST-OPERATIVE DIAGNOSIS:  VENTRAL HERNIA  PROCEDURE:  Procedure(s): VENTRAL HERNIA REPAIR (N/A) INSERTION OF MESH  SURGEON:  Surgeon(s) and Role:    * Griselda Miner, MD - Primary  PHYSICIAN ASSISTANT:   ASSISTANTS: none   ANESTHESIA:   local and general  EBL:  10 mL   BLOOD ADMINISTERED:none  DRAINS: none   LOCAL MEDICATIONS USED:  MARCAINE     SPECIMEN:  No Specimen  DISPOSITION OF SPECIMEN:  N/A  COUNTS:  YES  TOURNIQUET:  * No tourniquets in log *  DICTATION: .Dragon Dictation  After informed consent was obtained the patient was brought to the operating room and placed in the supine position on the operating table.  After adequate induction of general anesthesia the patient's abdomen was prepped with ChloraPrep, allowed to dry, and draped in usual sterile manner.  The patient had a small fascial defect in the epigastrium.  This area was infiltrated with quarter percent Marcaine.  A small vertically oriented incision was made at the site of the palpable bulge.  The incision was carried through the skin and subcutaneous tissue sharply with the electrocautery.  Dissection was then carried down to the fascia of the abdominal wall.  I was able to identify a small area of herniated preperitoneal fat.  This was easily reduced back through the fascial defect.  The fascial defect measured only about 1 cm.  A small piece of umbilical hernia mesh was chosen.  The mesh was placed just beneath the fascia and appeared to approximate the anterior abdominal wall fascia nicely without any redundancy.  The fascial defect was then closed with a couple of interrupted #1 Novafil stitches incorporating the anchors of the mesh.  Once this was accomplished the hernia seem well repaired and the mesh was in good position.  The wound was irrigated with saline.  The subcutaneous tissue was then  closed with interrupted 3-0 Vicryl stitches.  The skin was then closed with a running 4-0 Monocryl subcuticular stitch.  Dermabond dressings were applied.  The patient tolerated the procedure well.  At the end of the case all needle sponge and instrument counts were correct.  The patient was then awakened and taken to recovery in stable condition.  PLAN OF CARE: Discharge to home after PACU  PATIENT DISPOSITION:  PACU - hemodynamically stable.   Delay start of Pharmacological VTE agent (>24hrs) due to surgical blood loss or risk of bleeding: not applicable

## 2021-10-27 NOTE — Anesthesia Postprocedure Evaluation (Signed)
Anesthesia Post Note  Patient: DIANI JILLSON  Procedure(s) Performed: VENTRAL HERNIA REPAIR INSERTION OF MESH (Abdomen)     Patient location during evaluation: PACU Anesthesia Type: General Level of consciousness: awake and alert Pain management: pain level controlled Vital Signs Assessment: post-procedure vital signs reviewed and stable Respiratory status: spontaneous breathing, nonlabored ventilation and respiratory function stable Cardiovascular status: blood pressure returned to baseline and stable Postop Assessment: no apparent nausea or vomiting Anesthetic complications: no   No notable events documented.  Last Vitals:  Vitals:   10/27/21 1152 10/27/21 1207  BP: 116/71 (!) 145/72  Pulse: 66 (!) 59  Resp: 13 12  Temp:  36.5 C  SpO2: 96% 97%    Last Pain:  Vitals:   10/27/21 1207  TempSrc:   PainSc: 0-No pain                 Lucretia Kern

## 2021-10-27 NOTE — H&P (Signed)
REFERRING PHYSICIAN: Nicki Reaper, NP  PROVIDER: Lindell Noe, MD  MRN: [id number] DOB: [birth year] Subjective  Chief Complaint: Hernia   History of Present Illness: Victoria [name] is a 60 y.o. female who is seen today as an office consultation at the request of Dr. Sampson Si for evaluation of Hernia .   We are asked to see the patient in consultation by Dr. Lovie Macadamia to evaluate her for a ventral hernia. The patient is a 60 year old white female who has noticed a small bulge along the upper abdomen for several years. It has gradually become more tender for her. It seems to be bulging a little bit more than it used to. She denies any nausea or vomiting. Her appetite is good and her bowels are working normally. She does have an artificial heart valve and is on Coumadin. She is followed by Dr. Herbie Baltimore in cardiology  Review of Systems: A complete review of systems was obtained from the patient. I have reviewed this information and discussed as appropriate with the patient. See HPI as well for other ROS.  ROS   Medical History: Past Medical History:  Diagnosis Date   Hypertension   Patient Active Problem List  Diagnosis   Ventral hernia without obstruction or gangrene   Past Surgical History:  Procedure Laterality Date   ATRIAL SEPTECTOMY OPEN HEART W/CPB    No Known Allergies  Current Outpatient Medications on File Prior to Visit  Medication Sig Dispense Refill   warfarin (COUMADIN) 5 MG tablet 5 mg   amLODIPine (NORVASC) 10 MG tablet Take 10 mg by mouth once daily   atorvastatin (LIPITOR) 40 MG tablet Take 40 mg by mouth at bedtime   carvediloL (COREG) 3.125 MG tablet Take 3.125 mg by mouth every 12 (twelve) hours   No current facility-administered medications on file prior to visit.   Family History  Problem Relation Age of Onset   Breast cancer Mother   Stroke Father   High blood pressure (Hypertension) Father   Hyperlipidemia (Elevated cholesterol) Father    Coronary Artery Disease (Blocked arteries around heart) Father   Diabetes Father   Stroke Brother    Social History   Tobacco Use  Smoking Status Former Smoker  Smokeless Tobacco Former Neurosurgeon   Quit date: 2011    Social History   Socioeconomic History   Marital status: Married  Tobacco Use   Smoking status: Former Smoker   Smokeless tobacco: Former Neurosurgeon  Quit date: 2011  Substance and Sexual Activity   Alcohol use: Yes  Alcohol/week: 21.0 standard drinks  Types: 21 Cans of beer per week   Drug use: Never   Objective:   Vitals:  BP: 124/62  Pulse: 65  Temp: 36.8 C (98.2 F)  SpO2: 99%  Weight: 55.4 kg (122 lb 3.2 oz)  Height: 152.4 cm (5')   Body mass index is 23.87 kg/m.  Physical Exam Vitals reviewed.  Constitutional:  General: She is not in acute distress. Appearance: Normal appearance.  HENT:  Head: Normocephalic and atraumatic.  Right Ear: External ear normal.  Left Ear: External ear normal.  Nose: Nose normal.  Mouth/Throat:  Mouth: Mucous membranes are moist.  Pharynx: Oropharynx is clear.  Eyes:  General: No scleral icterus. Extraocular Movements: Extraocular movements intact.  Conjunctiva/sclera: Conjunctivae normal.  Pupils: Pupils are equal, round, and reactive to light.  Cardiovascular:  Rate and Rhythm: Normal rate and regular rhythm.  Pulses: Normal pulses.  Heart sounds: Murmur heard.  Comments: There is a loud audible  click from the artificial heart valve Pulmonary:  Effort: Pulmonary effort is normal. No respiratory distress.  Breath sounds: Normal breath sounds.  Abdominal:  General: Bowel sounds are normal.  Palpations: Abdomen is soft.  Tenderness: There is no abdominal tenderness.  Hernia: A hernia is present.  Comments: There is a small reducible bulge with palpable fascial defect in the epigastric region. This is likely from one of her drains from her heart surgery that violated the fascia  Musculoskeletal:  General: No  swelling, tenderness or deformity. Normal range of motion.  Cervical back: Normal range of motion and neck supple.  Skin: General: Skin is warm and dry.  Coloration: Skin is not jaundiced.  Neurological:  General: No focal deficit present.  Mental Status: She is alert and oriented to person, place, and time.  Psychiatric:  Mood and Affect: Mood normal.  Behavior: Behavior normal.     Labs, Imaging and Diagnostic Testing:  Assessment and Plan:  Diagnoses and all orders for this visit:  Ventral hernia without obstruction or gangrene    The patient appears to have a small epigastric ventral hernia likely from one of her drains from her cardiac surgery and valve replacement. Because it is causing her pain I feel it would be reasonable to repair the hernia. I have discussed with her in detail the risks and benefits of the operation as well as some of the technical aspects including the use of mesh and the risk of chronic pain and she understands and wishes to proceed. Given her history of heart valve replacement she would need cardiac clearance from the cardiologist and she would need to be off the blood thinner to have surgery. We will contact Dr. Herbie Baltimore for these recommendations. We will call her for scheduling once we have clearance.

## 2021-10-27 NOTE — Progress Notes (Signed)
When patient arrived in short stay, BP was elevated 174/95 with Pulse 52. When rechecked, BO 180/71 and Pulse 56. Patient took this AM at 08:00 o'clock Coreg 3.125 mg and Norvasc 10 mg. Dr. Milon Score was notified. No new orders at this time. Will continue to monitor.

## 2021-10-27 NOTE — Transfer of Care (Signed)
Immediate Anesthesia Transfer of Care Note  Patient: Victoria [name]  Procedure(s) Performed: VENTRAL HERNIA REPAIR INSERTION OF MESH (Abdomen)  Patient Location: PACU  Anesthesia Type:General  Level of Consciousness: drowsy and patient cooperative  Airway & Oxygen Therapy: Patient Spontanous Breathing and Patient connected to nasal cannula oxygen  Post-op Assessment: Report given to RN and Post -op Vital signs reviewed and stable  Post vital signs: Reviewed and stable  Last Vitals:  Vitals Value Taken Time  BP 127/65 10/27/21 1137  Temp    Pulse 71 10/27/21 1139  Resp 14 10/27/21 1139  SpO2 100 % 10/27/21 1139  Vitals shown include unvalidated device data.  Last Pain:  Vitals:   10/27/21 0938  TempSrc:   PainSc: 0-No pain         Complications: No notable events documented.

## 2021-10-30 ENCOUNTER — Encounter (HOSPITAL_COMMUNITY): Payer: Self-pay | Admitting: General Surgery

## 2021-10-30 ENCOUNTER — Telehealth: Payer: Self-pay | Admitting: *Deleted

## 2021-10-30 DIAGNOSIS — M47892 Other spondylosis, cervical region: Secondary | ICD-10-CM

## 2021-10-30 DIAGNOSIS — I700 Atherosclerosis of aorta: Secondary | ICD-10-CM

## 2021-10-30 DIAGNOSIS — M25512 Pain in left shoulder: Secondary | ICD-10-CM

## 2021-10-30 NOTE — Telephone Encounter (Signed)
Case discussed with RN Rolly Salter via telephone and agreed with plan of care regarding evaluation by [facility].  Reviewed Epic patient had 1cm fascial defect of epigastrium repaired with mesh on 10/27/21.

## 2021-10-30 NOTE — Telephone Encounter (Signed)
Pt called RN this morning reporting L shoulder pain since Friday morning 11/4. 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 She woke up Friday morning with pain behind L shoulder blade going up to her neck. Describes as burning, stabbing. Worsened over weekend. Had hernia surgery Fri AM and sts "I don't know if my surgery area even hurts because my shoulder is so bad."  Spoke with NP Inetta Fermo, HR Tim and WCC Tasha and decision made to send pt to [facility] from home today as she has a ride there and is on her way now. Will f/u with clinic and pt tomorrow morning for recommendations or other needs. Will also need to ask pt to contact supervisor to complete incident report.

## 2021-10-31 ENCOUNTER — Telehealth: Payer: Self-pay | Admitting: Cardiology

## 2021-10-31 MED ORDER — CYCLOBENZAPRINE HCL 10 MG PO TABS: 10.0000 mg | ORAL_TABLET | Freq: Three times a day (TID) | ORAL | 0 refills | Status: DC | PRN

## 2021-10-31 NOTE — Telephone Encounter (Signed)
Case discussed with RN Rolly Salter today in clinic.  Approved cyclobenzaprine 10mg  po TID prn muscle spasms.  Pain slightly improved today with tylenol/stretching/heat.  Will follow up with patient via telephone 11/01/21

## 2021-10-31 NOTE — Telephone Encounter (Signed)
Pt updated and verbalized understanding.  

## 2021-10-31 NOTE — Telephone Encounter (Signed)
Pt calling RN this morning. Seen at [facility] yesterday 11/7. Was Dx'd muscle strain. Instructed to take no more than 2000mg  Tylenol per day until she speaks with cardiology to confirm how high of dose she can go to on her Coumadin. Given stretches to perform, which she did last night and then went back to her heating pad.  Restrictions: Out of work the rest of today. Starting tomorrow, no use of her left arm above shoulder level and no lifting with the left arm more than 5 pounds. As she feels able, she should be allowed to advance her activity and if you are unable to accommodate these limits, she should be put out of work entirely. Pt out of work r/t hernia repair at least until f/u appt on 11/30. Sts she feels about the same as yesterday. Pain 8.5/10 today. Was 10/10 when speaking to her yesterday when coordinating [facility] appt.  Discussed case with NP [facility]. She recommends stretches for back and shoulder including arm pendulums, spider wall crawl, chest stretch. Pt doing most of these already per [facility] instructions. When pinching trapezius muscle over shoulder, she reports it feels tight and painful. Verbal order for cyclobenzaprine entered to pick up at external pharmacy of choice. Will f/u tomorrow or Thursday. Pt given NP's remote work number in case needed.

## 2021-10-31 NOTE — Telephone Encounter (Signed)
Pt called stating she was instructed by workman comp doctor that she can take 650 mg of tylenol 3 times a day for back pain but needed approval from cardiologist to increase more than that. Pt is questioning what is the max dose she can take.  Will forward to Pharm D

## 2021-10-31 NOTE — Telephone Encounter (Signed)
Three 650mg  tablets of acetaminophen a day should be fine.  However, I see she has a prescription of hydrocodone/acetaminophen as well.  If she takes this medication, plus regular Tylenol, she will exceed this limit.

## 2021-10-31 NOTE — Telephone Encounter (Signed)
Pt c/o medication issue:  1. Name of Medication: Tylenol   2. How are you currently taking this medication (dosage and times per day)? 3  650mg  pills a day  3. Are you having a reaction (difficulty breathing--STAT)? no  4. What is your medication issue? Pt is wanting to know if she can take a higher dosage... please advise

## 2021-11-01 NOTE — Telephone Encounter (Signed)
Patient contacted via telephone and she reported still having shoulder pain under her bra strap on back.  Hasn't tried tennis ball yet.  Took cyclobenzaprine at 0600 and one yesterday.  Helped to loosen up her neck muscles some.  Hydrocodone from hernia repair helps for about 2 hours but then pain is right back.  Stated when getting positioned on OR table they strapped her arms down and it did worsen shoulder pain.  Has mostly been sitting in recliner and ends up folded over.  States due to surgical site pain walking bent over at the waist.  Has noticed neck AROM decreased cannot flex neck like usual to look at phone has to bring phone up to eye level instead to read screen.  Discussed soaking towel in epsom salt hot water and wrapping neck/back/shoulder since she cannot soak in shower/tub due to hernia repair.  She is using her heating pad with massage feature and it helps some.  She did discuss her tylenol 975mg  BID with anticoagulation clinic and they stated tylenol plus hydrocodone is maximum pain medication use they recommend.  Discussed with patient using her tennis ball to stand next to wall to roll over back/scapular and neck muscles since patient not comfortable to use body weight and lying on floor.  She has been doing arm pendulums and they are painful.  Denied swelling/bruise/rash/tingling/numbness/weakness to shoulder/neck/arm.  Virtual visit scheduled for 11/02/21 at 1130 with patient.  Duration of call 16 minutes.  Patient A&Ox3 spoke full sentences without difficulty no cough/congestion/throat clearing audible.  Patient verbalized understanding information/instructions, agreed with plan of care and had no further questions at this time.

## 2021-11-02 ENCOUNTER — Encounter: Payer: Self-pay | Admitting: Registered Nurse

## 2021-11-02 ENCOUNTER — Telehealth: Payer: Self-pay | Admitting: Registered Nurse

## 2021-11-02 DIAGNOSIS — S29012D Strain of muscle and tendon of back wall of thorax, subsequent encounter: Secondary | ICD-10-CM

## 2021-11-02 MED ORDER — LIDOCAINE 4 % EX PTCH
1.0000 | MEDICATED_PATCH | Freq: Every day | CUTANEOUS | Status: AC
Start: 2021-11-02 — End: 2021-11-05

## 2021-11-02 NOTE — Progress Notes (Signed)
Subjective:    Patient ID: Victoria [name], female    DOB: 1961-05-05, 60 y.o.   MRN: [id number]  59y/o caucasian female established patient requested video visit for evaluation of left shoulder pain. Patient recuperating at home post ventral hernia repair and has not returned to work yet.  Had left shoulder pain that started day prior to hernia repair at work.  Worsened after surgery patient thinks due to positioning for surgery. Tried epsom salt soaked towel yesterday helped a little.  Still taking hydrocodone prn from hernia repair and tylenol 1000mg  po BID prn pain. Hydrocodone wearing off at 2 hour point. Has tried heating pad and stretches without relief.  Sleeping did not go well last night. Denied swelling/arm weakness/tingling/numbness/rash or bruising to affected area.  Arm pendulums are very painful left per patient.  Flexeril is helping with tight neck muscles.  Patient consented to video visit and entire visit completed via video.  Duration of video visit 13 minutes.  I spent 23 minutes dedicated to the care of this patient on the date of this encounter to include pre-visit review of Epic encounters during the past year, results review, care everywhere, allergies, medical history, surgical history, family medical history, medications; face to face time with the patient, and post visit ordering of medications and writing patient instructions/attaching handouts to my chart account.   Patient location at home.  Provider location at [facility].     Review of Systems  Constitutional:  Positive for activity change and fatigue. Negative for appetite change, chills, diaphoresis and fever.  HENT:  Negative for trouble swallowing and voice change.   Eyes:  Negative for photophobia and visual disturbance.  Respiratory:  Negative for cough, shortness of breath, wheezing and stridor.   Cardiovascular:  Negative for palpitations.  Gastrointestinal:  Negative for diarrhea, nausea and  vomiting.  Endocrine: Negative for cold intolerance and heat intolerance.  Genitourinary:  Negative for difficulty urinating.  Musculoskeletal:  Positive for myalgias and neck pain. Negative for gait problem, joint swelling and neck stiffness.  Skin:  Positive for wound. Negative for color change and rash.  Allergic/Immunologic: Negative for food allergies.  Neurological:  Negative for dizziness, tremors, seizures, syncope, facial asymmetry, speech difficulty, weakness, light-headedness, numbness and headaches.  Hematological:  Negative for adenopathy. Does not bruise/bleed easily.  Psychiatric/Behavioral:  Positive for sleep disturbance. Negative for agitation and confusion.       Objective:   Physical Exam Constitutional:      General: She is awake. She is not in acute distress.    Appearance: Normal appearance. She is well-developed, well-groomed and normal weight. She is not ill-appearing, toxic-appearing or diaphoretic.  HENT:     Head: Normocephalic and atraumatic.     Jaw: There is normal jaw occlusion.     Salivary Glands: Right salivary gland is not diffusely enlarged. Left salivary gland is not diffusely enlarged.     Right Ear: Hearing and external ear normal.     Left Ear: Hearing and external ear normal.     Nose: Nose normal. No congestion or rhinorrhea.     Mouth/Throat:     Lips: Pink. No lesions.     Mouth: Mucous membranes are moist.     Pharynx: Oropharynx is clear.  Eyes:     General: Lids are normal. Vision grossly intact. Gaze aligned appropriately. No scleral icterus.       Right eye: No discharge.        Left eye: No discharge.  Extraocular Movements: Extraocular movements intact.     Conjunctiva/sclera: Conjunctivae normal.     Pupils: Pupils are equal, round, and reactive to light.  Neck:     Trachea: Trachea and phonation normal. No tracheal deviation.  Pulmonary:     Effort: Pulmonary effort is normal.     Breath sounds: Normal breath sounds and  air entry. No stridor or transmitted upper airway sounds. No wheezing.     Comments: Spoke full sentences without difficulty; no cough observed Abdominal:     General: Abdomen is flat.    Musculoskeletal:        General: Signs of injury present. No swelling or deformity.     Right shoulder: Tenderness present. No swelling, deformity, effusion or laceration. Decreased range of motion.     Left shoulder: No swelling, deformity, effusion, laceration or tenderness. Normal range of motion.     Right upper arm: No swelling, edema, deformity or lacerations.     Left upper arm: No swelling, edema, deformity or lacerations.     Right elbow: No swelling, deformity, effusion or lacerations. Normal range of motion.     Left elbow: No swelling, deformity, effusion or lacerations. Normal range of motion.     Right forearm: No swelling, edema, deformity or lacerations.     Left forearm: No swelling, edema, deformity or lacerations.     Right hand: No swelling, deformity or lacerations. Normal range of motion.     Left hand: No swelling, deformity or lacerations. Normal range of motion.       Arms:     Cervical back: Neck supple. No edema, erythema or rigidity. Pain with movement present. Decreased range of motion.     Comments: Left latissmus dorsi TTP by patient directed exam; left shoulder arom decreased compared to right; left  flexion/abduction to 90 degrees then stops due to pain; right to 170; Patient able to hold phone in hand either for video call; gait sure and steady in her home; no rash/bruising/appreciable swelling noted; patient wearing tank top and briefly lifted shirt and wearing bra; using "car" massager wooden, electric heating pad  Lymphadenopathy:     Head:     Right side of head: No submandibular or preauricular adenopathy.     Left side of head: No submandibular or preauricular adenopathy.     Cervical:     Right cervical: No superficial cervical adenopathy.    Left cervical: No  superficial cervical adenopathy.  Skin:    General: Skin is warm and dry.     Capillary Refill: Capillary refill takes less than 2 seconds.     Coloration: Skin is not ashen, cyanotic, jaundiced, mottled, pale or sallow.     Findings: Wound present. No abrasion, abscess, acne, bruising, burn, ecchymosis, erythema, signs of injury, laceration, lesion, petechiae or rash.     Nails: There is no clubbing.       Neurological:     General: No focal deficit present.     Mental Status: She is alert and oriented to person, place, and time. Mental status is at baseline.     GCS: GCS eye subscore is 4. GCS verbal subscore is 5. GCS motor subscore is 6.     Cranial Nerves: No cranial nerve deficit, dysarthria or facial asymmetry.     Motor: Motor function is intact. No weakness, tremor, atrophy, abnormal muscle tone or seizure activity.     Coordination: Coordination is intact. Coordination normal.     Gait: Gait is intact.  Gait normal.     Comments: In/out of chair without difficulty; gait sure and steady; carries phone/holds for video visit  Psychiatric:        Attention and Perception: Attention and perception normal.        Mood and Affect: Mood and affect normal.        Speech: Speech normal.        Behavior: Behavior normal. Behavior is cooperative.        Thought Content: Thought content normal.        Cognition and Memory: Cognition and memory normal.        Judgment: Judgment normal.          Assessment & Plan:   A-latissmus dorsi strain subsequent encounter  P-Patient will be onsite tomorrow for blood draw for anticoagulation clinic.  Notify RN Rolly Salter if new or worsening symptoms.  Exitcare handout on muscle strain.  Trial of OTC lidocaine patch 4% apply 1 daily prn pain over affected area.  Continue heat, epsom salt soaked towel, gentle stretches, massage, tylenol 1000mg  po BID prn pain, flexeril 5-10mg  po TID prn muscle spasms and hydrocodone prn per surgeon instructions.   Discussed with RN if no improvement patient will need to see [facility] provider again as I am unable to prescribe controlled substances in this clinic due to contract limitations. Discussed with patient no oral NSAIDS or aspirin use recommended due to warfarin use.  Patient verbalized understanding information/instructions, agreed with plan of care and had no further questions at this time.

## 2021-11-02 NOTE — Patient Instructions (Signed)
Muscle Strain ?A muscle strain is an injury that occurs when a muscle is stretched beyond its normal length. Usually, a small number of muscle fibers are torn when this happens. There are three types of muscle strains. First-degree strains have the least amount of muscle fiber tearing and the least amount of pain. Second-degree and third-degree strains have more tearing and pain. ?Usually, recovery from muscle strain takes 1-2 weeks. Complete healing normally takes 5-6 weeks. ?What are the causes? ?This condition is caused when a sudden, violent force is placed on a muscle and stretches it too far. This may occur with a fall, while lifting, or during sports. ?What increases the risk? ?This condition is more likely to develop in athletes and people who are physically active. ?What are the signs or symptoms? ?Symptoms of this condition include: ?Pain. ?Tenderness. ?Bruising. ?Swelling. ?Trouble using the muscle. ?How is this diagnosed? ?This condition is diagnosed based on a physical exam and your medical history. Tests may also be done, including an X-ray, ultrasound, or MRI. ?How is this treated? ?This condition is initially treated with PRICE therapy. This therapy involves: ?Protecting the muscle from being injured again. ?Resting the injured muscle. ?Icing the injured muscle. ?Applying pressure (compression) to the injured muscle. This may be done with a splint or elastic bandage. ?Raising (elevating) the injured muscle. ?Your health care provider may also recommend medicine for pain. ?Follow these instructions at home: ?If you have a removable splint: ?Wear the splint as told by your health care provider. Remove it only as told by your health care provider. ?Check the skin around the splint every day. Tell your health care provider about any concerns. ?Loosen the splint if your fingers or toes tingle, become numb, or turn cold and blue. ?Keep the splint clean. ?If the splint is not waterproof: ?Do not let it get  wet. ?Cover it with a watertight covering when you take a bath or a shower. ?Managing pain, stiffness, and swelling ? ?If directed, put ice on the injured area. To do this: ?If you have a removable splint, remove it as told by your health care provider. ?Put ice in a plastic bag. ?Place a towel between your skin and the bag. ?Leave the ice on for 20 minutes, 2-3 times a day. ?Remove the ice if your skin turns bright red. This is very important. If you cannot feel pain, heat, or cold, you have a greater risk of damage to the area. ?Move your fingers or toes often to reduce stiffness and swelling. ?Raise (elevate) the injured area above the level of your heart while you are sitting or lying down. ?Wear an elastic bandage as told by your health care provider. Make sure that it is not too tight. ?General instructions ?Take over-the-counter and prescription medicines only as told by your health care provider. Treatment may include muscle relaxants or medicines for pain and inflammation that are taken by mouth or applied to the skin. ?Restrict your activity and rest the injured muscle as told by your health care provider. Gentle movements may be allowed. ?If physical therapy was prescribed, do exercises as told by your health care provider. ?Do not put pressure on any part of the splint until it is fully hardened. This may take several hours. ?Do not use any products that contain nicotine or tobacco. These products include cigarettes, chewing tobacco, and vaping devices, such as e-cigarettes. If you need help quitting, ask your health care provider. ?Ask your health care provider when it is   safe to drive if you have a splint. ?Keep all follow-up visits. This is important. ?How is this prevented? ?Warm up before exercising. This helps to prevent future muscle strains. ?Contact a health care provider if: ?You have more pain or swelling in the injured area. ?Get help right away if: ?You have numbness or tingling in the  injured area. ?You lose a lot of strength in the injured area. ?Summary ?A muscle strain is an injury that occurs when a muscle is stretched beyond its normal length. ?This condition is caused when a sudden, violent force is placed on a muscle and stretches it too far. ?This condition is initially treated with PRICE therapy, which involves protecting, resting, icing, compressing, and elevating. ?Gentle movements may be allowed. If physical therapy was prescribed, do exercises as told by your health care provider. ?This information is not intended to replace advice given to you by your health care provider. Make sure you discuss any questions you have with your health care provider. ?Document Revised: 02/27/2021 Document Reviewed: 02/27/2021 ?Elsevier Patient Education ? 2022 Elsevier Inc. ? ?

## 2021-11-03 ENCOUNTER — Ambulatory Visit: Payer: Self-pay | Admitting: *Deleted

## 2021-11-03 ENCOUNTER — Other Ambulatory Visit: Payer: Self-pay

## 2021-11-03 VITALS — BP 119/79 | HR 68

## 2021-11-03 DIAGNOSIS — Z7901 Long term (current) use of anticoagulants: Secondary | ICD-10-CM

## 2021-11-03 DIAGNOSIS — Z952 Presence of prosthetic heart valve: Secondary | ICD-10-CM

## 2021-11-04 LAB — PROTIME-INR
INR: 2.2 — ABNORMAL HIGH (ref 0.9–1.2)
Prothrombin Time: 22.4 s — ABNORMAL HIGH (ref 9.1–12.0)

## 2021-11-06 ENCOUNTER — Ambulatory Visit (INDEPENDENT_AMBULATORY_CARE_PROVIDER_SITE_OTHER): Payer: No Typology Code available for payment source | Admitting: Cardiology

## 2021-11-06 DIAGNOSIS — Z5181 Encounter for therapeutic drug level monitoring: Secondary | ICD-10-CM | POA: Diagnosis not present

## 2021-11-06 NOTE — Telephone Encounter (Signed)
Pt returned call and LVM stating surgeon office advised pain could be from gas used to inflate abdomen during surgery and they recommended to call her primary. NP Inetta Fermo made aware of same by phone.

## 2021-11-06 NOTE — Telephone Encounter (Signed)
Reviewed RN Rolly Salter note and agreed with plan of care.  Patient contacting PCM and re-evaluation by [facility] pending.

## 2021-11-06 NOTE — Patient Instructions (Addendum)
Description   Called and spoke to pt and instructed her to continue taking warfarin  7.5 mg (1.5 tablets) daily except 1 tablet each Sunday.  Repeat INR in 4 weeks.

## 2021-11-06 NOTE — Telephone Encounter (Signed)
Spoke with pt by phone after discussing case with NP Inetta Fermo by phone. Per Inetta Fermo "studies say pain from gas bubbles can last 3-9 days after surgery. I have given all I could think of and had hoped lidocaine would help...nothing found in online search to recommend other things they all say heat, position changes, nsaid or pain meds. My guess is pain is two fold, shoulder strain and gas bubble." Advised pt of same and recommended contact primary as advised by surgeon office. Clinic has exhausted options at this point. Depending on what primary recommends, consider earlier f/u appt with [facility] clinic. Pt agreeable with contacting primary. Will f/u.

## 2021-11-06 NOTE — Telephone Encounter (Signed)
Pt called clinic this morning advising shoulder pain on Day 12 today is just as bad as Day 1. Recommended at this point that pt contact surgeon office and make them aware as well. Pt hanging up to do this. Will f/u for their plan.

## 2021-11-06 NOTE — Telephone Encounter (Signed)
RN Rolly Salter contacted NP via telephone.  Patient still with shoulder pain worsening again.  Discussed literature reviewed and research indicates typically laparoscopic shoulder surgery pain related to gas bubble irritation phrenic nerve typically lasts 3-9 days.  Heat, NSAID, position change or stronger medication typical treatment post surgery.  Discussed with RN Rolly Salter patient had shoulder pain prior to surgery, experienced worsening during positioning on OR table and post surgery.  Most likely a combination a strain and post surgical laparoscopic surgery pain related to gas bubble but could be a new condition.  Contract limitations prohibit me from prescribing controlled substances in this clinic.  Agree with plan of care for patient to follow up with PCM as heat/position change/massage/flexeril and controlled substance hydrocodone from surgeon have not controlled her pain and not common course for post operative gas bubble pain to last 12 days.  RN Rolly Salter stated she would notify patient.

## 2021-11-07 MED ORDER — CYCLOBENZAPRINE HCL 10 MG PO TABS: 5.0000 mg | ORAL_TABLET | Freq: Three times a day (TID) | ORAL | 0 refills | Status: AC | PRN

## 2021-11-07 NOTE — Telephone Encounter (Signed)
Contacted patient via telephone reported some improvement in pain today still taking flexeril and hydrocodone.   Will run out of flexeril in a couple days would like refill.  Hydrocodone left over from a previous surgery used all of her bottle from hernia repair and surgeon would not refill.  Stated worker's comp clinic staff contacted her today via telephone to follow up also.  She was not able to see PCM yesterday and feeling a bit better this am.  "I haven't cried today so that is a big improvement"  Yesterday she reported had to use lamaze breathing to get through the pain.  Denied headaches/shortness of breath/chest pain/headache/visual changes.  Using heating pad still and it helps.  Position changes does improve or worsen pain.  She does feel there is still an air bubble as with position change it feels like moves to armpit.  Mailed cyclobenzaprine 10mg  sig t1/2 -1 tab po TID prn pain #30 RF0 dispensed from [facility] to patient today.  Discussed with patient if new chest pain/headache/visual changes/shortness of breath since she had surgery/off anticoagulants there was a risk of clots so if new or worsening symptoms needs to see PCM/UC/ER provider same day.  Patient INR 2.2 in range on recheck after surgery and she is continuing 7.5mg  po daily and repeat INR 12/04/21.  Patient A&Ox3 spoke full sentences without difficulty, laughed a little during call no cough/wheeze/congestion/throat clearing/shortness of breath noted during 5 minute call.  Patient verbalized understanding information/instructions, agreed with plan of care and had no further questions at this time.

## 2021-11-08 ENCOUNTER — Ambulatory Visit: Payer: Self-pay

## 2021-11-08 NOTE — Telephone Encounter (Signed)
I can see her Friday at 4

## 2021-11-08 NOTE — Telephone Encounter (Signed)
Pt. Reports she had ventral hernia surgery 10/27/21. Since then has had left axilla pain. 8-9/10 pain. No bruising or swelling. Asking to be seen in office. No availability. Asking to be worked in. Please advise. Practice currently closed. Answer Assessment - Initial Assessment Questions 1. ONSET: "When did the pain start?"     10/27/21 2. LOCATION: "Where is the pain located?"     Left under arm pain 3. PAIN: "How bad is the pain?" (Scale 1-10; or mild, moderate, severe)   - MILD (1-3): doesn't interfere with normal activities   - MODERATE (4-7): interferes with normal activities (e.g., work or school) or awakens from sleep   - SEVERE (8-10): excruciating pain, unable to do any normal activities, unable to hold a cup of water     8-9 4. WORK OR EXERCISE: "Has there been any recent work or exercise that involved this part of the body?"     No 5. CAUSE: "What do you think is causing the arm pain?"     Had hernia repair 6. OTHER SYMPTOMS: "Do you have any other symptoms?" (e.g., neck pain, swelling, rash, fever, numbness, weakness)     No 7. PREGNANCY: "Is there any chance you are pregnant?" "When was your last menstrual period?"     No  Protocols used: Arm Pain-A-AH

## 2021-11-09 NOTE — Telephone Encounter (Signed)
Spoke with patient today via telephone.  Pain improved to 7/10 when up and around.   It is better today than yesterday.  When I woke up I thought it was resolved but pain occurs when she sits up and starts walking. Still using heat patches, flexeril TID and hydrocodone and has been doing her exercises for shoulder.  She asked if needs to continue them.  Discussed gentle AROM not a bad thing but if any exercises worsening pain stop performing those exercises. She denied being able to trigger pain with palpation of muscles.   Has 2 hydrocodone pills remaining and still waiting for [employer] to deliver her flexeril refill.  PCM appt scheduled for Friday afternoon.  Tolerating po intake without difficulty.  Denied shortness of breath/chest pain/n/v/d/fever/chills/headache/visual changes/bruising.  Patient A&Ox3 spoke full sentences without difficulty.  Duration of call 5 minutes.  No cough/wheezing/throat clearing or nasal congestion audible.  Patient encouraged to keep PCM appt and notify me if any worsening of symptoms.  Patient verbalized understanding information/instructions, agreed with plan of care and had no further questions at this time.

## 2021-11-10 ENCOUNTER — Other Ambulatory Visit: Payer: Self-pay

## 2021-11-10 ENCOUNTER — Encounter: Payer: Self-pay | Admitting: Internal Medicine

## 2021-11-10 ENCOUNTER — Ambulatory Visit: Payer: No Typology Code available for payment source | Admitting: Internal Medicine

## 2021-11-10 VITALS — BP 145/83 | HR 78 | Temp 97.1°F | Resp 17 | Ht 60.5 in | Wt 117.6 lb

## 2021-11-10 DIAGNOSIS — S29011A Strain of muscle and tendon of front wall of thorax, initial encounter: Secondary | ICD-10-CM

## 2021-11-10 MED ORDER — HYDROCODONE-ACETAMINOPHEN 5-325 MG PO TABS: 1.0000 | ORAL_TABLET | Freq: Four times a day (QID) | ORAL | 0 refills | Status: DC | PRN

## 2021-11-10 NOTE — Patient Instructions (Signed)
Muscle Strain ?A muscle strain, or pulled muscle, happens when a muscle is stretched beyond its normal length. This can tear some muscle fibers and cause pain. ?Usually, it takes 1-2 weeks to heal from a muscle strain. Full healing normally takes 5-6 weeks. ?What are the causes? ?This condition is caused when a sudden force is placed on a muscle and stretches it too far. This can happen with a fall, while lifting, or during sports. ?What increases the risk? ?You are more likely to develop a muscle strain if you are an athlete or you do a lot of physical activity. ?What are the signs or symptoms? ?Pain. ?Tenderness. ?Bruising. ?Swelling. ?Trouble using the muscle. ?How is this treated? ?This condition is first treated with PRICE therapy. This involves: ?Protecting your muscle from being injured again. ?Resting your injured muscle. ?Icing your injured muscle. ?Putting pressure (compression) on your injured muscle. This may be done with a splint or elastic bandage. ?Raising (elevating) your injured muscle. ?Your doctor may also recommend medicine for pain. ?Follow these instructions at home: ?If you have a splint that can be taken off: ?Wear the splint as told by your doctor. Take it off only as told by your doctor. ?Check the skin around the splint every day. Tell your doctor if you see problems. ?Loosen the splint if your fingers or toes: ?Tingle. ?Become numb. ?Turn cold and blue. ?Keep the splint clean. ?If the splint is not waterproof: ?Do not let it get wet. ?Cover it with a watertight covering when you take a bath or a shower. ?Managing pain, stiffness, and swelling ? ?If told, put ice on your injured area. To do this: ?If you have a removable splint, take it off as told by your doctor. ?Put ice in a plastic bag. ?Place a towel between your skin and the bag. ?Leave the ice on for 20 minutes, 2-3 times a day. ?Take off the ice if your skin turns bright red. This is very important. If you cannot feel pain, heat,  or cold, you have a greater risk of damage to the area. ?Move your fingers or toes often. ?Raise the injured area above the level of your heart while you are sitting or lying down. ?Wear an elastic bandage as told by your doctor. Make sure it is not too tight. ?General instructions ?Take over-the-counter and prescription medicines only as told by your doctor. This may include: ?Medicines for pain and swelling that are taken by mouth or put on the skin. ?Medicines to help relax your muscles. ?Limit your activity. Rest your injured muscle as told by your doctor. Your doctor may say that gentle movements are okay. ?If physical therapy was prescribed, do exercises as told by your doctor. ?Do not put pressure on any part of the splint until it is fully hardened. This may take many hours. ?Do not smoke or use any products that contain nicotine or tobacco. If you need help quitting, ask your doctor. ?Ask your doctor when it is safe to drive if you have a splint. ?Keep all follow-up visits. ?How is this prevented? ?Warm up before you exercise. This helps to prevent more muscle strains. ?Contact a doctor if: ?You have more pain or swelling in the injured area. ?Get help right away if: ?You have any of these problems in your injured area: ?Numbness. ?Tingling. ?Less strength than normal. ?Summary ?A muscle strain is an injury that happens when a muscle is stretched beyond normal length. ?This condition is first treated with PRICE   therapy. This includes protecting, resting, icing, adding pressure, and raising your injury. ?Limit your activity. Rest your injured muscle as told by your doctor. Your doctor may say that gentle movements are okay. ?Warm up before you exercise. This helps to prevent more muscle strains. ?This information is not intended to replace advice given to you by your health care provider. Make sure you discuss any questions you have with your health care provider. ?Document Revised: 02/27/2021 Document  Reviewed: 02/27/2021 ?Elsevier Patient Education ? 2022 Elsevier Inc. ? ?

## 2021-11-10 NOTE — Progress Notes (Signed)
Subjective:    Patient ID: Victoria [name], female    DOB: 1961/08/17, 60 y.o.   MRN: [id number]  HPI  Patient presents the clinic today with complaint of pain in her left axilla.  She noticed this 2 weeks ago after having surgery for her abdominal wall hernia. She reports her arms were strapped down on the table. She describes the pain as sharp and twisting. The pain radiates under her left armpit. She denies numbness or tingling. The pain is worse with movement. She has tried Hydrocodone and Cyclobenzaprine with minimal relief of symptoms.  Review of Systems  Past Medical History:  Diagnosis Date   History of bicuspid aortic valve     with severe AS and dilated aortic root/arch.   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 30 tablet 0   HYDROcodone-acetaminophen (NORCO/VICODIN) 5-325 MG tablet Take 1 tablet by mouth every 6 (six) hours as needed for moderate pain or severe pain. 15 tablet 0   warfarin (COUMADIN) 5 MG tablet TAKE 1 TO 1 & 1/2 TABLETS BY MOUTH DAILY AS DIRECTED BY COUMADIN CLINIC (Patient taking differently: Take 5-7.5 mg by mouth See admin instructions. Take 5 mg by mouth on Sunday, all remaining days take 7.5 mg) 135 tablet 0   No current facility-administered medications for this visit.    Allergies  Allergen Reactions    Lisinopril     Mood changes    Family History  Problem Relation Age of Onset   Stroke Father    Hypertension Father    Breast cancer Maternal Grandmother    Lung cancer Maternal Grandmother     Social History   Socioeconomic History   Marital status: Divorced    Spouse name: Not on file   Number of children: 1   Years of education: Not on file   Highest education level: Not on file  Occupational History    Employer: [employer]  Tobacco Use   Smoking status: Former    Types: Cigarettes    Quit date: 01/01/2010    Years since quitting: 11.8   Smokeless tobacco: Never  Vaping Use   Vaping Use: Never used  Substance and Sexual Activity   Alcohol use: Yes    Alcohol/week: 30.0 standard drinks    Types: 12 Standard drinks or equivalent, 18 Cans of beer per week   Drug use: No   Sexual activity: Yes    Birth control/protection: Surgical  Other Topics Concern   Not on file  Social History Narrative   She is a divorced mother of one, grandmother 1. She usually exercises 4-5 days a week. Very active.   Quit smoking in 2011.   Takes 1-2 beers per day and is stable.   Social Determinants of Corporate investment banker  Strain: Not on file  Food Insecurity: Not on file  Transportation Needs: Not on file  Physical Activity: Not on file  Stress: Not on file  Social Connections: Not on file  Intimate Partner Violence: Not on file     Constitutional: Denies fever, malaise, fatigue, headache or abrupt weight changes.  Respiratory: Denies difficulty breathing, shortness of breath, cough or sputum production.   Cardiovascular: Denies chest pain, chest tightness, palpitations or swelling in the hands or feet.  Gastrointestinal: Denies abdominal pain, bloating, constipation, diarrhea or blood in the stool.  Musculoskeletal: Denies decrease in range of motion, difficulty with gait, muscle pain or joint pain and swelling.  Skin: Patient reports pain in the left axilla.  Denies  redness, rashes, lesions or ulcercations.  Neurological: Denies numbness, tingling, weakness or problems with balance and coordination.    No other specific complaints in a complete review of systems (except as listed in HPI above).     Objective:   Physical Exam  BP (!) 145/83 (BP Location: Right Arm, Patient Position: Sitting, Cuff Size: Normal)   Pulse 78   Temp (!) 97.1 F (36.2 C) (Temporal)   Resp 17   Ht 5' 0.5" (1.537 m)   Wt 117 lb 9.6 oz (53.3 kg)   SpO2 98%   BMI 22.59 kg/m   Wt Readings from Last 3 Encounters:  10/27/21 119 lb (54 kg)  10/23/21 124 lb 8 oz (56.5 kg)  08/15/21 122 lb 12.8 oz (55.7 kg)    General: Appears her stated age, well developed, well nourished in NAD. Skin: Warm, dry and intact. No rashes noted. Cardiovascular: Normal rate and rhythm.  Pulmonary/Chest: Normal effort and positive vesicular breath sounds. No respiratory distress. No wheezes, rales or ronchi noted.  Musculoskeletal: Normal flexion, extension and rotation of the cervical spine. Decreased external rotation of the left shoulder secondary to pain. Normal internal rotation. Negative drop can test. Pain with palpation under the left axillary region, at the chest wall. Hand grips equal.  Neurological: Alert and oriented.     Component Value Date/Time   NA 140 10/23/2021 1550   NA 140 02/06/2021 0929   K 4.0 10/23/2021 1550   CL 106 10/23/2021 1550   CO2 27 10/23/2021 1550   GLUCOSE 102 (H) 10/23/2021 1550   BUN 16 10/23/2021 1550   BUN 10 02/06/2021 0929   CREATININE 0.64 10/23/2021 1550   CALCIUM 9.4 10/23/2021 1550   GFRNONAA >60 10/23/2021 1550   GFRAA 114 02/06/2021 0929    Lipid Panel     Component Value Date/Time   CHOL 145 02/06/2021 0929   TRIG 77 02/06/2021 0929   HDL 70 02/06/2021 0929   CHOLHDL 2.1 02/06/2021 0929   LDLCALC 60 02/06/2021 0929    CBC    Component Value Date/Time   WBC 7.1 10/23/2021 1521   RBC 4.16 10/23/2021 1521   HGB 12.8 10/23/2021  1521   HGB 13.2 02/06/2021 0929   HCT 39.2 10/23/2021 1521   HCT 40.1 02/06/2021 0929   PLT 193 10/23/2021 1521   PLT 171 02/06/2021 0929   MCV 94.2 10/23/2021 1521   MCV 95 02/06/2021 0929   MCH 30.8 10/23/2021 1521   MCHC 32.7 10/23/2021 1521   RDW 13.4 10/23/2021 1521   RDW 12.8 02/06/2021 0929   LYMPHSABS 1.4 02/06/2021 0929   MONOABS 0.7 07/06/2011 1147   EOSABS 0.2 02/06/2021 0929   BASOSABS 0.0 02/06/2021 0929    Hgb A1C Lab Results  Component Value  Date   HGBA1C 5.7 (H) 10/23/2021            Assessment & Plan:   Strain of Left Chest Wall:  Encouraged heat and massage Unable to take NSAID's as she is on Coumadin Hydrocodone refilled Continue Cyclobenzaprine as needed Will consider PT if no improvement in symptoms in a few weeks  Return precautions discussed Nicki Reaper, NP This visit occurred during the SARS-CoV-2 public health emergency.  Safety protocols were in place, including screening questions prior to the visit, additional usage of staff PPE, and extensive cleaning of exam room while observing appropriate contact time as indicated for disinfecting solutions.

## 2021-11-12 NOTE — Telephone Encounter (Signed)
Reviewed PCM appt from Friday hydrocodone refilled.  May continue flexeril and tylenol.  Muscle strain noted left consider PT.

## 2021-11-15 ENCOUNTER — Other Ambulatory Visit: Payer: Self-pay | Admitting: Internal Medicine

## 2021-11-15 NOTE — Telephone Encounter (Signed)
Requested medication (s) are due for refill today Undetermined  Requested medication (s) are on the active medication list Yes  Future visit scheduled Yes for 11/23/21  Note to clinic-Non-delegated medication. Last ordered 11/10/21 #15 tabs no refills. No UDS within 360 days. Routing to provider for review.    Requested Prescriptions  Pending Prescriptions Disp Refills   HYDROcodone-acetaminophen (NORCO/VICODIN) 5-325 MG tablet 15 tablet 0    Sig: Take 1 tablet by mouth every 6 (six) hours as needed for moderate pain or severe pain.     Not Delegated - Analgesics:  Opioid Agonist Combinations Failed - 11/15/2021 12:37 PM      Failed - This refill cannot be delegated      Failed - Urine Drug Screen completed in last 360 days      Passed - Valid encounter within last 6 months    Recent Outpatient Visits           5 days ago Muscle strain of chest wall, initial encounter   [facility] [place], [place] W, NP   3 months ago Other specified abdominal hernia without obstruction or gangrene   [facility] [place], Salvadore Oxford, NP   6 years ago Sebaceous cyst of left axilla   [facility] at [facility], [place], [place]   6 years ago Sebaceous cyst of left axilla   Primary Care at [facility], [place], [place]   6 years ago Sebaceous cyst of left axilla   Primary Care at [facility], [place], [place]       Future Appointments             In 1 week Sampson Si, Salvadore Oxford, NP [facility] [place], [facility]

## 2021-11-15 NOTE — Telephone Encounter (Signed)
Patient contacted via telephone stated shoulder pain improved 6-7/10 today.  Used lidocaine patch yesterday and it helped some.  Still taking flexeril and hydrocodone.  Has 6 hydrocodone left worried about running out this holiday weekend.  Discussed with patient to contact [facility] office- NP Nicki Reaper since contract prohibits me filling controlled substance Rx in this clinic.  Patient has follow up with general surgery 11/22/21.  Patient stated small bruise no pain to ventral hernia site.  Discussed if PCM doesn't fill hydrocodone to discuss with surgeon since she remembers on OR table someone grabbing arm and telling them it hurts to discuss refill.  Patient wrapped ace bandages around chest/shoulder and helps to make it feel better also.  Patient using ice pack and heat alternating.  Patient reported with ace wrap on she felt like air bubble under skin noted/feels crackling when compressed.  Denied any swelling/rash/bruising over painful area left torso/shoulder.  Patient denied concerns at this time.  Getting better little by little.  Patient verbalized understanding information/instructions, agreed with plan of care and had no further questions at this time.

## 2021-11-15 NOTE — Telephone Encounter (Signed)
Medication Refill - Medication: HYDROcodone-acetaminophen (NORCO/VICODIN) 5-325 MG tablet  Has the patient contacted their pharmacy? No.   Preferred Pharmacy (with phone number or street name): [id number] - [place], [place] - [address]. Has the patient been seen for an appointment in the last year OR does the patient have an upcoming appointment? Yes.    Pt wants Rene Kocher to know she still cannot look down.  She is wanting to know the next step, as you had mentioned therapy?

## 2021-11-23 ENCOUNTER — Other Ambulatory Visit: Payer: Self-pay

## 2021-11-23 ENCOUNTER — Encounter: Payer: Self-pay | Admitting: Internal Medicine

## 2021-11-23 ENCOUNTER — Ambulatory Visit
Admission: RE | Admit: 2021-11-23 | Discharge: 2021-11-23 | Disposition: A | Discharge status: Home / Self Care | Payer: No Typology Code available for payment source | Source: Ambulatory Visit | Attending: Internal Medicine | Admitting: Internal Medicine

## 2021-11-23 ENCOUNTER — Ambulatory Visit: Payer: No Typology Code available for payment source | Admitting: Internal Medicine

## 2021-11-23 ENCOUNTER — Ambulatory Visit
Admission: RE | Admit: 2021-11-23 | Discharge: 2021-11-23 | Disposition: A | Discharge status: Home / Self Care | Payer: No Typology Code available for payment source | Attending: Internal Medicine | Admitting: Internal Medicine

## 2021-11-23 VITALS — BP 138/76 | HR 80 | Temp 97.1°F | Resp 17 | Ht 60.5 in | Wt 117.2 lb

## 2021-11-23 DIAGNOSIS — R0789 Other chest pain: Secondary | ICD-10-CM | POA: Insufficient documentation

## 2021-11-23 DIAGNOSIS — M542 Cervicalgia: Secondary | ICD-10-CM

## 2021-11-23 NOTE — Progress Notes (Addendum)
Subjective:    Patient ID: Victoria [name], female    DOB: August 22, 1961, 60 y.o.   MRN: [id number]  HPI  Patient presents the clinic today for follow-up of left chest wall pain.  She reports this started 2 weeks after having surgery for an abdominal wall hernia.  She describes the pain is sharp and pulling.  The pain is located in her left upper chest underneath her left axilla and into her left upper back.  She has had some neck pain which when she puts her chin to her chest, causes a sharp pain to shoot throughout her body.  She denies headache, dizziness or vision changes.  She denies numbness, tingling or weakness of her left upper extremity.  She has tried Cyclobenzaprine and Hydrocodone with minimal relief of symptoms.  She has followed up with her surgeon who recommended she see orthopedics for further evaluation.  Review of Systems     Past Medical History:  Diagnosis Date   History of bicuspid aortic valve     with severe AS and dilated aortic root/arch.   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 Jude 21 mm mechanical valve, 24 mm IMA shield vascular graft for the ascending aorta/arch), L carotid artery bypassing    Current Outpatient Medications  Medication Sig Dispense Refill   amLODipine (NORVASC) 10 MG tablet TAKE 1 TABLET BY MOUTH EVERY DAY 90 tablet 3   atorvastatin (LIPITOR) 40 MG tablet TAKE 1 TABLET BY MOUTH EVERYDAY AT BEDTIME 90 tablet 3   carvedilol (COREG) 3.125 MG tablet TAKE 1 TABLET BY MOUTH EVERY 12 HOURS 180 tablet 3   HYDROcodone-acetaminophen (NORCO/VICODIN) 5-325 MG tablet Take 1 tablet by mouth every 6 (six) hours as needed for moderate pain or severe pain. 15 tablet 0   warfarin (COUMADIN) 5 MG tablet TAKE 1 TO 1 & 1/2 TABLETS BY MOUTH DAILY AS DIRECTED BY COUMADIN CLINIC (Patient taking differently: Take 5-7.5 mg by mouth See admin instructions. Take 5 mg by mouth on Sunday, all remaining days take 7.5 mg) 135  tablet 0   No current facility-administered medications for this visit.    Allergies  Allergen Reactions   Lisinopril     Mood changes    Family History  Problem Relation Age of Onset   Stroke Father    Hypertension Father    Breast cancer Maternal Grandmother    Lung cancer Maternal Grandmother     Social History   Socioeconomic History   Marital status: Divorced    Spouse name: Not on file   Number of children: 1   Years of education: Not on file   Highest education level: Not on file  Occupational History    Employer: [employer]  Tobacco Use   Smoking status: Former    Types: Cigarettes    Quit date: 01/01/2010    Years since quitting: 11.9   Smokeless tobacco: Never  Vaping Use   Vaping Use: Never used  Substance and Sexual Activity   Alcohol use: Yes    Alcohol/week: 30.0 standard drinks    Types: 12 Standard drinks or equivalent, 18 Cans of beer per week   Drug use: No   Sexual activity: Yes    Birth control/protection: Surgical  Other Topics Concern   Not on file  Social History Narrative   She is a divorced mother of one, grandmother 1. She usually exercises 4-5 days a week. Very active.  Quit smoking in 2011.   Takes 1-2 beers per day and is stable.   Social Determinants of Health   Financial Resource Strain: Not on file  Food Insecurity: Not on file  Transportation Needs: Not on file  Physical Activity: Not on file  Stress: Not on file  Social Connections: Not on file  Intimate Partner Violence: Not on file     Constitutional: Denies fever, malaise, fatigue, headache or abrupt weight changes.  Respiratory: Denies difficulty breathing, shortness of breath, cough or sputum production.   Cardiovascular: Denies chest pain, chest tightness, palpitations or swelling in the hands or feet.  Gastrointestinal: Denies abdominal pain, bloating, constipation, diarrhea or blood in the stool.  GU: Denies urgency, frequency, pain with urination,  burning sensation, blood in urine, odor or discharge. Musculoskeletal: Patient reports left chest wall pain, neck pain.  Denies decrease in range of motion, difficulty with gait, or joint pain and swelling.  Skin: Denies redness, rashes, lesions or ulcercations.  Neurological: Denies dizziness, difficulty with memory, difficulty with speech or problems with balance and coordination.    No other specific complaints in a complete review of systems (except as listed in HPI above).  Objective:   Physical Exam  BP (!) 142/78 (BP Location: Right Arm, Patient Position: Sitting, Cuff Size: Normal)   Pulse 80   Temp (!) 97.1 F (36.2 C) (Temporal)   Resp 17   Ht 5' 0.5" (1.537 m)   Wt 117 lb 3.2 oz (53.2 kg)   SpO2 100%   BMI 22.51 kg/m   Wt Readings from Last 3 Encounters:  11/10/21 117 lb 9.6 oz (53.3 kg)  10/27/21 119 lb (54 kg)  10/23/21 124 lb 8 oz (56.5 kg)    General: Appears her stated age, well developed, well nourished in NAD. Skin: Warm, dry and intact. No rashes noted. HEENT: Head: normal shape and size; Eyes: sclera whiteand EOMs intact;  Cardiovascular: Normal rate and rhythm.  Click noted.  Pulmonary/Chest: Normal effort and positive vesicular breath sounds. No respiratory distress. No wheezes, rales or ronchi noted.  Musculoskeletal: Decreased flexion of the cervical spine secondary to pain.  Normal extension, rotation and lateral bending of the cervical spine.  No bony tenderness is noted over the cervical spine.  Decreased external rotation of the left shoulder secondary to pulling underneath the axilla.  Normal internal rotation.  Negative drop can test on the left.  Pain with palpation of the left lateral ribs, left upper chest.  No difficulty with gait. Neurological: Alert and oriented.    BMET    Component Value Date/Time   NA 140 10/23/2021 1550   NA 140 02/06/2021 0929   K 4.0 10/23/2021 1550   CL 106 10/23/2021 1550   CO2 27 10/23/2021 1550   GLUCOSE 102  (H) 10/23/2021 1550   BUN 16 10/23/2021 1550   BUN 10 02/06/2021 0929   CREATININE 0.64 10/23/2021 1550   CALCIUM 9.4 10/23/2021 1550   GFRNONAA >60 10/23/2021 1550   GFRAA 114 02/06/2021 0929    Lipid Panel     Component Value Date/Time   CHOL 145 02/06/2021 0929   TRIG 77 02/06/2021 0929   HDL 70 02/06/2021 0929   CHOLHDL 2.1 02/06/2021 0929   LDLCALC 60 02/06/2021 0929    CBC    Component Value Date/Time   WBC 7.1 10/23/2021 1521   RBC 4.16 10/23/2021 1521   HGB 12.8 10/23/2021 1521   HGB 13.2 02/06/2021 0929   HCT 39.2 10/23/2021 1521  HCT 40.1 02/06/2021 0929   PLT 193 10/23/2021 1521   PLT 171 02/06/2021 0929   MCV 94.2 10/23/2021 1521   MCV 95 02/06/2021 0929   MCH 30.8 10/23/2021 1521   MCHC 32.7 10/23/2021 1521   RDW 13.4 10/23/2021 1521   RDW 12.8 02/06/2021 0929   LYMPHSABS 1.4 02/06/2021 0929   MONOABS 0.7 07/06/2011 1147   EOSABS 0.2 02/06/2021 0929   BASOSABS 0.0 02/06/2021 0929    Hgb A1C Lab Results  Component Value Date   HGBA1C 5.7 (H) 10/23/2021          Assessment & Plan:    Acute Neck Pain, Left-Sided Chest Wall Pain:  X-ray cervical spine today X-ray left-sided ribs today Encouraged heat and stretching Okay to continue Tylenol ibuprofen OTC Referral to orthopedics placed for further evaluation patient request  We will follow-up after imaging with further recommendation and treatment plan Nicki Reaper, NP This visit occurred during the SARS-CoV-2 public health emergency.  Safety protocols were in place, including screening questions prior to the visit, additional usage of staff PPE, and extensive cleaning of exam room while observing appropriate contact time as indicated for disinfecting solutions.

## 2021-11-23 NOTE — Patient Instructions (Signed)
Muscle Strain ?A muscle strain, or pulled muscle, happens when a muscle is stretched beyond its normal length. This can tear some muscle fibers and cause pain. ?Usually, it takes 1-2 weeks to heal from a muscle strain. Full healing normally takes 5-6 weeks. ?What are the causes? ?This condition is caused when a sudden force is placed on a muscle and stretches it too far. This can happen with a fall, while lifting, or during sports. ?What increases the risk? ?You are more likely to develop a muscle strain if you are an athlete or you do a lot of physical activity. ?What are the signs or symptoms? ?Pain. ?Tenderness. ?Bruising. ?Swelling. ?Trouble using the muscle. ?How is this treated? ?This condition is first treated with PRICE therapy. This involves: ?Protecting your muscle from being injured again. ?Resting your injured muscle. ?Icing your injured muscle. ?Putting pressure (compression) on your injured muscle. This may be done with a splint or elastic bandage. ?Raising (elevating) your injured muscle. ?Your doctor may also recommend medicine for pain. ?Follow these instructions at home: ?If you have a splint that can be taken off: ?Wear the splint as told by your doctor. Take it off only as told by your doctor. ?Check the skin around the splint every day. Tell your doctor if you see problems. ?Loosen the splint if your fingers or toes: ?Tingle. ?Become numb. ?Turn cold and blue. ?Keep the splint clean. ?If the splint is not waterproof: ?Do not let it get wet. ?Cover it with a watertight covering when you take a bath or a shower. ?Managing pain, stiffness, and swelling ? ?If told, put ice on your injured area. To do this: ?If you have a removable splint, take it off as told by your doctor. ?Put ice in a plastic bag. ?Place a towel between your skin and the bag. ?Leave the ice on for 20 minutes, 2-3 times a day. ?Take off the ice if your skin turns bright red. This is very important. If you cannot feel pain, heat,  or cold, you have a greater risk of damage to the area. ?Move your fingers or toes often. ?Raise the injured area above the level of your heart while you are sitting or lying down. ?Wear an elastic bandage as told by your doctor. Make sure it is not too tight. ?General instructions ?Take over-the-counter and prescription medicines only as told by your doctor. This may include: ?Medicines for pain and swelling that are taken by mouth or put on the skin. ?Medicines to help relax your muscles. ?Limit your activity. Rest your injured muscle as told by your doctor. Your doctor may say that gentle movements are okay. ?If physical therapy was prescribed, do exercises as told by your doctor. ?Do not put pressure on any part of the splint until it is fully hardened. This may take many hours. ?Do not smoke or use any products that contain nicotine or tobacco. If you need help quitting, ask your doctor. ?Ask your doctor when it is safe to drive if you have a splint. ?Keep all follow-up visits. ?How is this prevented? ?Warm up before you exercise. This helps to prevent more muscle strains. ?Contact a doctor if: ?You have more pain or swelling in the injured area. ?Get help right away if: ?You have any of these problems in your injured area: ?Numbness. ?Tingling. ?Less strength than normal. ?Summary ?A muscle strain is an injury that happens when a muscle is stretched beyond normal length. ?This condition is first treated with PRICE   therapy. This includes protecting, resting, icing, adding pressure, and raising your injury. ?Limit your activity. Rest your injured muscle as told by your doctor. Your doctor may say that gentle movements are okay. ?Warm up before you exercise. This helps to prevent more muscle strains. ?This information is not intended to replace advice given to you by your health care provider. Make sure you discuss any questions you have with your health care provider. ?Document Revised: 02/27/2021 Document  Reviewed: 02/27/2021 ?Elsevier Patient Education ? 2022 Elsevier Inc. ? ?

## 2021-11-24 ENCOUNTER — Ambulatory Visit (HOSPITAL_COMMUNITY)
Admission: EM | Admit: 2021-11-24 | Discharge: 2021-11-24 | Disposition: A | Discharge status: Home / Self Care | Payer: No Typology Code available for payment source | Attending: Emergency Medicine | Admitting: Emergency Medicine

## 2021-11-24 ENCOUNTER — Other Ambulatory Visit: Payer: Self-pay

## 2021-11-24 ENCOUNTER — Encounter (HOSPITAL_COMMUNITY): Payer: Self-pay

## 2021-11-24 DIAGNOSIS — R0789 Other chest pain: Secondary | ICD-10-CM

## 2021-11-24 DIAGNOSIS — S161XXA Strain of muscle, fascia and tendon at neck level, initial encounter: Secondary | ICD-10-CM | POA: Diagnosis not present

## 2021-11-24 MED ORDER — GABAPENTIN 300 MG PO CAPS: 300.0000 mg | ORAL_CAPSULE | Freq: Three times a day (TID) | ORAL | 0 refills | Status: DC

## 2021-11-24 NOTE — ED Provider Notes (Signed)
[facility]    CSN: [id number] Arrival date & time: 11/24/21  1432      History   Chief Complaint Chief Complaint  Patient presents with   Chest Pain    HPI Victoria [name] is a 60 y.o. female.  Patient had ventral hernia surgery at the beginning of November and since her discharge from the hospital, she has experienced left sided mid back pain that wraps around to the front of her body and her chest.  Tender to palpation.  Pain is severe and unrelieved by Tylenol, oxycodone, lidocaine patches, IcyHot, cyclobenzaprine.  She has been evaluated for a more times for this problem.  Saw her PCP for a yesterday who ordered left rib x-ray.  Patient is very uncomfortable and unable to sleep.  Denies shortness of breath, wheezing, cough, fever, chills.  At the time of discharge from the hospital after hernia surgery, patient also had bilateral neck pain and stiffness.  This is been unrelieved by muscle relaxers.  Pain is worse in the morning when she wakes up and she is stiff and gets gradually better over the course of the day.  Has also been using warm compresses for it which helps some.  Her PCP is aware of this and also ordered cervical spine x-ray yesterday.  Rib x-ray and C-spine x-rays have not yet been read but the images are available.  On C-spine, it looks like she has reversal of cervical lordosis but will wait for final radiology interpretation.   Chest Pain Associated symptoms: no cough, no fever and no shortness of breath    Past Medical History:  Diagnosis Date   History of bicuspid aortic valve     with severe AS and dilated aortic root/arch.   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 Jude 21 mm mechanical valve, 24 mm IMA shield vascular graft for the ascending aorta/arch), L carotid artery bypassing    Patient Active Problem List   Diagnosis Date Noted   Aortic atherosclerosis (HCC) 11/26/2021   Prediabetes  08/15/2021   Essential hypertension 01/03/2014   Dyslipidemia, goal LDL below 130 01/03/2014    Past Surgical History:  Procedure Laterality Date   ABDOMINAL HYSTERECTOMY  2000   total   ANTERIOR CRUCIATE LIGAMENT REPAIR Left 2002   AORTIC VALVE REPLACEMENT  12/2009   St. Jude 53mm mechanical valve, Bentall procesure & L cartoid artery bypassing    BENTALL PROCEDURE  12/2009   See above   CARDIAC CATHETERIZATION  11/2009   No significant coronary disease. Normal LV function.   INSERTION OF MESH  10/27/2021   Procedure: INSERTION OF MESH;  Surgeon: Griselda Miner, MD;  [facility] OR;  Service: General;;   TRANSTHORACIC ECHOCARDIOGRAM  03/2013   Normal LV size and thickness. EF 65-70%.; Grade 3 diastolic dysfunction with elevated LAP; 21 mm tilting-disc St. Jude valve functioning normally. No pannus. Mild-moderate stenosis with velocities higher than anticipated for valve type. Peak gradient 35-39 mmHg, mean 20-24 mmHg.   TRANSTHORACIC ECHOCARDIOGRAM  1/'16, 1/19   a) 12/2014: Normal EF (60-65%), Normal wall motion. 21 mm St Jude Mech AoV Prosthesis- well seated, mild AI (not peri-valvular leak as originally noted). Peak-Mean Gradients: 35 mmHg & 22 mHg (likely size related).  Stable;; b) 12/2017: EF 60-65%.  Mild LVH.  Prosthetic aortic valve opens well.  Peak-mean gradients 36 and 19 mmHg respectively.  Appropriate for prosthesis.  Mild MR.  Trivial AI.  VENTRAL HERNIA REPAIR N/A 10/27/2021   Procedure: VENTRAL HERNIA REPAIR;  Surgeon: Griselda Miner, MD;  Location: [facility] OR;  Service: General;  Laterality: N/A;    OB History   No obstetric history on file.      Home Medications    Prior to Admission medications   Medication Sig Start Date End Date Taking? Authorizing Provider  gabapentin (NEURONTIN) 300 MG capsule Take 1 capsule (300 mg total) by mouth 3 (three) times daily. Start 300mg  qHS; may gradually increase to 300mg  TID 11/24/21  Yes , NP  amLODipine (NORVASC)  10 MG tablet TAKE 1 TABLET BY MOUTH EVERY DAY 06/05/21   Cathlyn Parsons, MD  atorvastatin (LIPITOR) 40 MG tablet TAKE 1 TABLET BY MOUTH EVERYDAY AT BEDTIME 03/07/21   Lennette Bihari, MD  carvedilol (COREG) 3.125 MG tablet TAKE 1 TABLET BY MOUTH EVERY 12 HOURS 06/05/21   [name], MD  cyclobenzaprine (FLEXERIL) 10 MG tablet Take 1 tablet (10 mg total) by mouth 3 (three) times daily as needed for muscle spasms. 11/26/21   Betancourt, Lennette Bihari, NP  warfarin (COUMADIN) 5 MG tablet TAKE 1 TO 1 & 1/2 TABLETS BY MOUTH DAILY AS DIRECTED BY COUMADIN CLINIC Patient taking differently: Take 5-7.5 mg by mouth See admin instructions. Take 5 mg by mouth on Sunday, all remaining days take 7.5 mg 10/05/21   Wednesday, MD    Family History Family History  Problem Relation Age of Onset   Stroke Father    Hypertension Father    Breast cancer Maternal Grandmother    Lung cancer Maternal Grandmother     Social History Social History   Tobacco Use   Smoking status: Former    Types: Cigarettes    Quit date: 01/01/2010    Years since quitting: 11.9   Smokeless tobacco: Never  Vaping Use   Vaping Use: Never used  Substance Use Topics   Alcohol use: Yes    Alcohol/week: 30.0 standard drinks    Types: 12 Standard drinks or equivalent, 18 Cans of beer per week   Drug use: No     Allergies   Lisinopril   Review of Systems Review of Systems  Constitutional:  Negative for chills and fever.  Respiratory:  Negative for cough, chest tightness, shortness of breath and wheezing.   Cardiovascular:  Positive for chest pain.  Musculoskeletal:  Positive for neck pain.    Physical Exam Triage Vital Signs ED Triage Vitals  Enc Vitals Group     BP 11/24/21 1447 (!) 144/93     Pulse Rate 11/24/21 1447 86     Resp 11/24/21 1447 18     Temp 11/24/21 1447 98.6 F (37 C)     Temp Source 11/24/21 1447 Oral     SpO2 11/24/21 1447 98 %     Weight --      Height --      Head Circumference --       Peak Flow --      Pain Score 11/24/21 1446 7     Pain Loc --      Pain Edu? --      Excl. in GC? --    No data found.  Updated Vital Signs BP (!) 144/93 (BP Location: Right Arm)   Pulse 86   Temp 98.6 F (37 C) (Oral)   Resp 18   SpO2 98%   Visual Acuity Right Eye Distance:   Left Eye Distance:  Bilateral Distance:    Right Eye Near:   Left Eye Near:    Bilateral Near:     Physical Exam Constitutional:      General: She is not in acute distress.    Appearance: She is well-developed. She is not ill-appearing.  Neck:   Cardiovascular:     Rate and Rhythm: Normal rate and regular rhythm.  Pulmonary:     Effort: Pulmonary effort is normal.  Chest:    Musculoskeletal:     Cervical back: Muscular tenderness present. No spinous process tenderness. Decreased range of motion.  Skin:    Findings: No rash.  Neurological:     Mental Status: She is alert.     UC Treatments / Results  Labs (all labs ordered are listed, but only abnormal results are displayed) Labs Reviewed - No data to display  EKG   Radiology No results found.  Procedures Procedures (including critical care time)  Medications Ordered in UC Medications - No data to display  Initial Impression / Assessment and Plan / UC Course  I have reviewed the triage vital signs and the nursing notes.  Pertinent labs & imaging results that were available during my care of the patient were reviewed by me and considered in my medical decision making (see chart for details).    Patient reports she has had multiple visits for the same problems.  Looking at her cervical spine x-ray, I suspect she is having a neck muscle strain probably related to positioning during surgery.  She reports cyclobenzaprine does not help.  Reviewed neck exercises for stretching, use of IcyHot, use of Epson salt soaks and warm compresses.  Advised patient to wait until she hears from PCP for official radiology results.  As far as her  chest and back pain go, I wonder if she is developing shingles.  She does not have a history of shingles she has pain and what appears to be one dermatome that does not cross the midline.  Opioid pain medicines have not helped her.  IcyHot and lidocaine patches have not helped her.  I prescribed gabapentin with the idea that this may be related to a nerve kind of pain.  Patient to follow-up with PCP.  Final Clinical Impressions(s) / UC Diagnoses   Final diagnoses:  Chest wall pain  Strain of neck muscle, initial encounter     Discharge Instructions      Continue using lidocaine patches and icy hot and warm compresses/heat therapy to your chest and back pain.  I suggest IcyHot and warm compresses with gentle stretching for your neck.  Please follow-up with the orthopedist on Wednesday as scheduled.     ED Prescriptions     Medication Sig Dispense Auth. Provider   gabapentin (NEURONTIN) 300 MG capsule Take 1 capsule (300 mg total) by mouth 3 (three) times daily. Start 300mg  qHS; may gradually increase to 300mg  TID 30 capsule Dearis Danis, , NP      PDMP not reviewed this encounter.   , NP 11/28/21 1213

## 2021-11-24 NOTE — ED Triage Notes (Incomplete)
Pt reports left sided chest pain, left sided back pain  x 4 weeks. States pain is worse after she had ventral hernia repair 4 weeks ago. Pain is worse in the mornings and when looking down. Denies vision changes, headache, dizziness.

## 2021-11-24 NOTE — Telephone Encounter (Signed)
Pt left VM asking for return call. She reports pain is same as It has been for 4 weeks. Saw pcp yesterday and had c-spine and rib xrays, no report in yet. They referred to Ortho but appt not until Wed 12/7. Discussed [facility] as an option for pt today or this weekend They also have back/neck/spine PA appt available for 12/5 per website. Pt stated she is going to do [facility] today. Will f/u with pt this weekend.

## 2021-11-24 NOTE — Discharge Instructions (Signed)
Continue using lidocaine patches and icy hot and warm compresses/heat therapy to your chest and back pain.  I suggest IcyHot and warm compresses with gentle stretching for your neck.  Please follow-up with the orthopedist on Wednesday as scheduled.

## 2021-11-24 NOTE — Telephone Encounter (Signed)
Reviewed RN Rolly Salter note agreed with plan of care and will follow up with patient via telephone tomorrow.  No [facility] office notes in Epic on review.  Patient saw PCM yesterday neck and rib xrays performed radiology interpretation still pending.  Was given referral to orthopedics by PCM.

## 2021-11-26 DIAGNOSIS — I700 Atherosclerosis of aorta: Secondary | ICD-10-CM | POA: Insufficient documentation

## 2021-11-26 MED ORDER — CYCLOBENZAPRINE HCL 10 MG PO TABS: 10.0000 mg | ORAL_TABLET | Freq: Three times a day (TID) | ORAL | 0 refills | Status: DC | PRN

## 2021-11-26 NOTE — Telephone Encounter (Signed)
Patient contacted via telephone stated she went to [facility] and was told walk in clinic only does arms/legs and sent her to urgent care facility next to hospital. Epic reviewed EKG normal.  C-spine and rib results available from radiology today.  CLINICAL DATA:  Neck pain   EXAM: CERVICAL SPINE - COMPLETE 4+ VIEW   COMPARISON:  None.   FINDINGS: There is mild reversal of normal cervical lordosis in the midcervical spine. There is mild narrowing of the C4-5 interspace and moderate narrowing C5-6 with endplate spurring. No prevertebral soft tissue swelling. Osseous foraminal encroachment C5-C6 bilaterally.   Sternotomy wires. Dental restorations. Aortic Atherosclerosis (ICD10-170.0). Calcified left carotid bifurcation plaque.   IMPRESSION: 1. Negative for fracture or other acute finding. 2. Spondylitic changes C4-5, C5-6 as above.     Electronically Signed   By: Corlis Leak M.D.   On: 11/25/2021 12:44  CLINICAL DATA:  Left-sided pain   EXAM: LEFT RIBS - 2 VIEW   COMPARISON:  CT 07/06/2011 and previous   FINDINGS: Changes of median sternotomy and AVR. Aortic Atherosclerosis (ICD10-170.0).   No pneumothorax or effusion.  No displaced rib fracture.   IMPRESSION: Negative.     Electronically Signed   By: Corlis Leak M.D.   On: 11/25/2021 12:45   Discussed findings with patient consistent with "arthritis/bone spurs.  Discussed since muscle relaxer helping some and typically worse in am after sleeping symptoms consistent with arthritis/compression of nerve.  Patient stated she took gabapentin 300mg  - 3 capsules at bedtime last night didn't help at all woke up in 10/10 pain.  Took flexeril and applied lidocaine patch today and helped some pain now 7/10.   Discussed continue lidocaine patch application as instructed. Has 1 flexeril left and would like refill.  Electronic Rx sent to her pharmacy of choice 10mg  po TID prn muscle spasms #30 RF0  She notes with c-spine  flexion gets pain behind left breast denied radiation down arms or arm weakness.  [facility] provider stated this could be shingles.  Patient has never developed rash during previous 4 weeks of pain.  Recommended with patient due to half life of gabapentin 5-7 hours per epocrates take 300mg  po TID on a schedule and alternate with flexeril po TID so each dose 4 hours apart. Discussed both can cause drowsiness and avoid alcohol.  Patient had stopped all tylenol.  Discussed to take her 500mg  po q6h again restart.  Continue heating pad and if no relief with new dosing regimen/restarting tylenol will trial prednisone steroid taper 30mg x2 days; 20mg x2 days; 10mg  x 2 days and 5mg  x 2 days #13 RF0.  Patient to call me tomorrow morning if pain worse otherwise RN 12/10 or I will contact her mid-day.  Discussed with patient nerve most likely irritated prior to surgery at work; repositioning worsened when she remembered sharp pain on table; possible gas bubble added to irritation after surgery and more active now as ventral hernia repair site healed may be worsening bone spurs/nerve compression again along with cold weather related arthritis pain.  Patient has referral to orthopedics from [facility].  Discussed typical treatment for nerve related pain is gabapentin.  Steroids sometimes used with arthritis short course/heat/gentle arom/tylenol.  If muscle spasms then flexeril/heat/arom/avoiding prolonged static positions.  Patient orthopedics appt Wednesday 0800.  Discussed with patient continue her statin and watch diet as atherosclerosis noted in aorta.  Discussed no rib fractures/fluid in lungs or pneumothorax.  Patient verbalized understanding information/instructions, agreed with plan of care and had  no further questions at this time.

## 2021-11-27 NOTE — Telephone Encounter (Signed)
Patient contacted via telephone stated today pain was much better controlled with regimen discussed yesterday spacing gabapentin/flexeril and tylenol and taking ATC.  4/10 pain this evening.  Patient has orthopedics follow up appt scheduled for Wed 12/7 am.  Discussed with patient will call in the afternoon to follow up.  Patient agreed with plan of care and had no further questions at this time.

## 2021-11-29 ENCOUNTER — Other Ambulatory Visit: Payer: Self-pay

## 2021-11-29 ENCOUNTER — Ambulatory Visit: Payer: No Typology Code available for payment source | Admitting: Surgery

## 2021-11-29 ENCOUNTER — Ambulatory Visit (HOSPITAL_BASED_OUTPATIENT_CLINIC_OR_DEPARTMENT_OTHER): Payer: No Typology Code available for payment source | Admitting: Nurse Practitioner

## 2021-11-29 ENCOUNTER — Encounter (HOSPITAL_BASED_OUTPATIENT_CLINIC_OR_DEPARTMENT_OTHER): Payer: Self-pay | Admitting: Nurse Practitioner

## 2021-11-29 ENCOUNTER — Encounter: Payer: Self-pay | Admitting: Surgery

## 2021-11-29 VITALS — BP 132/74 | HR 76 | Ht 60.0 in | Wt 121.7 lb

## 2021-11-29 DIAGNOSIS — E785 Hyperlipidemia, unspecified: Secondary | ICD-10-CM

## 2021-11-29 DIAGNOSIS — Z952 Presence of prosthetic heart valve: Secondary | ICD-10-CM | POA: Diagnosis not present

## 2021-11-29 DIAGNOSIS — I10 Essential (primary) hypertension: Secondary | ICD-10-CM

## 2021-11-29 DIAGNOSIS — R0789 Other chest pain: Secondary | ICD-10-CM | POA: Diagnosis not present

## 2021-11-29 DIAGNOSIS — M546 Pain in thoracic spine: Secondary | ICD-10-CM | POA: Diagnosis not present

## 2021-11-29 DIAGNOSIS — R1013 Epigastric pain: Secondary | ICD-10-CM | POA: Diagnosis not present

## 2021-11-29 NOTE — Telephone Encounter (Signed)
Spoke with patient via telephone and reviewed epic.  Patient saw orthopedics and cardiology.  MRI thoracic pending ordered by orthopedics.  Patient getting relief with gabapentin/tylenol/flexeril combination pain level 4/10 again today.  Discussed I cannot refill gabapentin and Rx was from urgent care.  She will need to follow up with PCM as controlled substance and contract limitations prohibit me from prescribing in this clinic.  Patient has follow up in 2 weeks with orthopedics per epic.  Patient asked why MRI not scheduled today and discussed typically they want authorization from insurance company prior to scheduling.  Orthopedics instructed patient to follow up with hernia surgeon as he thinks pain related to surgery.  Patient still tender in abdomen on exam today.   HR notified patient not cleared to return onsite and follow up with specialist scheduled in 2 weeks.  Patient frustrated as saw cardiology and orthopedics/urgent care and PCM and no answers for her pain and tired of being in pain.  Notified patient I can refill flexeril when needed.  Patient denied new or worsening symptoms.  Pain still increases with neck flexion and she stated she notified orthopedics provider that this is occurring.

## 2021-11-29 NOTE — Progress Notes (Signed)
Office Visit    Patient Name: Victoria [name] Date of Encounter: 11/29/2021  PCP:  Jearld Fenton, NP   [place]  Cardiologist:  Glenetta Hew, MD  Advanced Practice Provider:  No care team member to display Electrophysiologist:  None      Chief Complaint    Victoria [name] is a 60 y.o. female with a hx of congenital bicuspid aortic valve with severe aortic stenosis s/p AVR/Bentall procedure with a 21 mm Saint Jude mechanical valve and left carotid bypass 12/2009, hypertension, hyperlipidemia, chronic anticoagulation presents today for chest pain   Past Medical History    Past Medical History:  Diagnosis Date   History of bicuspid aortic valve     with severe AS and dilated aortic root/arch.   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 Jude 21 mm mechanical valve, 24 mm IMA shield vascular graft for the ascending aorta/arch), L carotid artery bypassing   Past Surgical History:  Procedure Laterality Date   ABDOMINAL HYSTERECTOMY  2000   total   ANTERIOR CRUCIATE LIGAMENT REPAIR Left 2002   AORTIC VALVE REPLACEMENT  12/2009   St. Jude 52mm mechanical valve, Bentall procesure & L cartoid artery bypassing    BENTALL PROCEDURE  12/2009   See above   CARDIAC CATHETERIZATION  11/2009   No significant coronary disease. Normal LV function.   INSERTION OF MESH  10/27/2021   Procedure: INSERTION OF MESH;  Surgeon: Jovita Kussmaul, MD;  Location: [facility];  Service: General;;   TRANSTHORACIC ECHOCARDIOGRAM  03/2013   Normal LV size and thickness. EF 65-70%.; Grade 3 diastolic dysfunction with elevated LAP; 21 mm tilting-disc St. Jude valve functioning normally. No pannus. Mild-moderate stenosis with velocities higher than anticipated for valve type. Peak gradient 35-39 mmHg, mean 20-24 mmHg.   TRANSTHORACIC ECHOCARDIOGRAM  1/'16, 1/19   a) 12/2014: Normal EF (60-65%), Normal wall motion. 21 mm St Jude Mech AoV Prosthesis-  well seated, mild AI (not peri-valvular leak as originally noted). Peak-Mean Gradients: 35 mmHg & 22 mHg (likely size related).  Stable;; b) 12/2017: EF 60-65%.  Mild LVH.  Prosthetic aortic valve opens well.  Peak-mean gradients 36 and 19 mmHg respectively.  Appropriate for prosthesis.  Mild MR.  Trivial AI.   VENTRAL HERNIA REPAIR N/A 10/27/2021   Procedure: VENTRAL HERNIA REPAIR;  Surgeon: Jovita Kussmaul, MD;  Location: [place];  Service: General;  Laterality: N/A;    Allergies  Allergies  Allergen Reactions   Lisinopril     Mood changes    History of Present Illness    Victoria [name] is a 60 y.o. female with a hx of congenital bicuspid aortic valve with severe aortic stenosis s/p AVR/Bentall procedure with a 21 mm Saint Jude mechanical valve and left carotid bypass 12/2009, hypertension, hyperlipidemia, chronic anticoagulation last seen 02/09/21.  Cardiac cath 2010 with no coronary disease. No ischemic evaluation since that time.   She was last seen 02/09/2021 by Dr. Ellyn Hack.  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  She was doing well from a cardiac perspective and no changes made.   She had urgent care visit 11/24/21 due to chest pain. She had ventral hernia surgery early November and  noted persistent left sided pain wrapping around to front of body and chest which was tender on palpation. She was also experiencing persistent neck pain. There was concern in ED for shingles or nerve pain and Rx for gabapentin provided. She has had multiple visits with primary care for muscle strain of chest. EKG independently reviewed from 11/24/21 showed NSR 86 bpm with short PR 108 and no acute ST/T wave changes.  She presents today for follow up with her longtime female friend. Reports ventral hernia  repair on 10/27/21 with constant "sharp" pain in her left side since the surgery. It is worse when she lifts her arm. She reports chest pain only occurs when she bends her neck and head to look down at the floor. She denies shortness of breath, lower extremity edema, palpitations, melena, hematuria, hemoptysis, diaphoresis, weakness, presyncope, syncope, orthopnea, and PND. Reports she has been sent to multiple specialities to identify the cause of the pain. Was recently given Rx for gabapentin which has provided mild pain relief, "now I can get out of bed without crying." She has not taken ibuprofen or Tylenol because she thought both caused bleeding.   EKGs/Labs/Other Studies Reviewed:   The following studies were reviewed today:  TTE 01/27/2021: Normal LV function.  EF 60 to 65%.  No R WMA.  Probably elevated LAP, but unable to determine due to mitral annular calcification.  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  21 mmHg mechanical valve present in the aortic position, well-seated.  Mild AI with no AS.Marland Kitchen  Ascending aorta and root size are normal (root and his aorta both replaced).   EKG:  EKG is  ordered today.  The ekg ordered today demonstrates NSR at rate of 76 bpm, no ST/T wave abnormality  Recent Labs: 02/06/2021: TSH 1.890 10/23/2021: ALT 21; BUN 16; Creatinine, Ser 0.64; Hemoglobin 12.8; Platelets 193; Potassium 4.0; Sodium 140  Recent Lipid Panel    Component Value Date/Time   CHOL 145 02/06/2021 0929   TRIG 77 02/06/2021 0929   HDL 70 02/06/2021 0929   CHOLHDL 2.1 02/06/2021 0929   LDLCALC 60 02/06/2021 0929    Home Medications   Current Meds  Medication Sig   amLODipine (NORVASC) 10 MG tablet TAKE 1 TABLET BY MOUTH EVERY DAY   atorvastatin (LIPITOR) 40 MG tablet TAKE 1 TABLET BY MOUTH EVERYDAY AT BEDTIME   carvedilol (COREG) 3.125 MG tablet TAKE 1 TABLET BY MOUTH EVERY 12 HOURS   cyclobenzaprine (FLEXERIL) 10 MG tablet Take 1 tablet (10 mg total) by mouth 3 (three) times  daily as needed for muscle spasms.   gabapentin (NEURONTIN) 300 MG capsule Take 1 capsule (300 mg total) by mouth 3 (three) times daily. Start 300mg  qHS; may gradually increase to 300mg  TID   warfarin (COUMADIN) 5 MG tablet TAKE 1 TO 1 & 1/2 TABLETS BY MOUTH DAILY AS DIRECTED BY COUMADIN CLINIC (Patient taking differently: Take 5-7.5 mg by mouth See admin instructions. Take 5 mg by mouth on Sunday, all remaining days take 7.5 mg)     Review of Systems    ++left side pain along ribcage ++fatigue All other systems reviewed and are otherwise negative except as noted above.  Physical Exam    VS:  BP 132/74   Pulse 76   Ht 5' (1.524 m)   Wt 121 lb 11.2 oz (55.2 kg)   SpO2 97%   BMI 23.77 kg/m  , BMI Body mass index is 23.77 kg/m.  Wt Readings from Last 3 Encounters:  11/29/21 121 lb 11.2 oz (55.2 kg)  11/23/21 117 lb 3.2 oz (53.2 kg)  11/10/21 117 lb 9.6 oz (53.3 kg)     GEN: Well nourished, well developed, in no acute distress. HEENT: normal. Neck: Supple, unable to auscultate for carotid bruit due to murmurm, no JVD or masses. Cardiac: RRR, S2 click, 2/6 harsh murmur best auscultated at the right and left sternal border with radiation to back and lung files. No rubs, or gallops. No clubbing, cyanosis, edema.  Radials/PT 2+ and equal bilaterally.  Respiratory:  Respirations regular and unlabored, clear to auscultation bilaterally. GI: Soft, nontender, nondistended. MS: No deformity or atrophy. Skin: Warm and dry, no rash. Neuro:  Strength and sensation are intact. Psych: Normal affect.  Assessment & Plan    Chest pain in adult - Pain is atypical for angina. It is worsened with the movement of her head and neck. Majority of pain is coming from her left side along the length of her ribs. We discussed any symptoms of chest discomfort, SOB, or activity intolerance prior to surgery which she denies. She was very active prior to surgery riding bicycle, walking, and doing house and yard  work. I encouraged her to continue to plan of care to determine cause of pain as directed by surgeon and/orthopedics. Advised she may take Tylenol 1000 mg 3 times daily for pain relief. Advised that we can order a coronary CT in the future if she has chest discomfort, SOB or activity intolerance that occurs with exertion as she recuperates from post-surgical left side pain and gradually increases activity.   HTN - BP is stable today. She has not been monitoring it regularly but would not be surprised if she has had elevated BP readings associated with increased pain. Will continue to follow and address at next visit in 2 months. Continue carvedilol, amlodipine.   HLD - LDL 60 on 02/06/21. Continue atorvastatin.   S/p mechanical AVR 12/2020 / Chronic anticoagulation - Echo 01/2021 with normal LVEF and only mild AI. Plan for repeat echo in 3 years per Dr. Herbie Baltimore. Continue SBE prophylaxis.  Disposition: Follow up in 3 month(s) with Bryan Lemma, MD or APP.  Signed, Levi Aland, NP 11/29/2021, 4:31 PM [facility]

## 2021-11-29 NOTE — Progress Notes (Signed)
Office Visit Note   Patient: Victoria [name]           Date of Birth: [birth year]           MRN: [id number] Visit Date: 11/29/2021              Requested by: Lorre Munroe, NP [id number] [place],  [place] [address] PCP: Lorre Munroe, NP   Assessment & Plan: Visit Diagnoses:  1. Pain in thoracic spine   2. Epigastric pain   3. Left-sided chest wall pain     Plan: Had long discussion with the patient and family member was present that with the complaint of left-sided chest pain that she must follow-up with her cardiologist for evaluation.  I asked my assistant to contact Dr. Herbie Baltimore office and they were very kind to schedule patient an appointment for this afternoon.  Also advised patient that with this pain that she is describing that she must follow-up with her general surgeon since she does state that the pain has only been really going on since her surgery.  Advised patient that I did review potential complications of ventral hernia repair and this does include the possibility of nerve irritation that could cause some pain that she is describing.  Advised patient that I did review the cervical spine x-rays that were ordered by primary care provider.  She does have multilevel spondylosis most noted at C5-6 with disc space collapse along with straightening of the normal cervical lordosis but this would not cause the current problem that is being addressed by me today.  For the sake of being thorough I did order a thoracic MRI to rule out a possible thoracic HNP.  She will follow-up with me in a couple weeks to review the study.  I will make decision at that time as to whether or not further imaging studies are indicated from an orthopedic standpoint.  I did stress to her the importance of getting in contact with her general surgeon and also seeing cardiology today.  All questions answered.  Follow-Up Instructions: Return in about 2 weeks (around 12/13/2021) for with Ariele Vidrio to review thoracic  mri scan.   Orders:  Orders Placed This Encounter  Procedures   MR Thoracic Spine w/o contrast   No orders of the defined types were placed in this encounter.     Procedures: No procedures performed   Clinical Data: No additional findings.   Subjective: Chief Complaint  Patient presents with   Middle Back - Pain    Upper left back pain wraps to front of body to chest on left side.    HPI 60 year old female who is a new patient to the clinic comes in today with complaints of epigastric pain, left-sided chest pain and left upper back pain.  Patient's medical history significant for aortic valve replacement and left carotid bypass January 2011.  Followed by cardiologist Dr. Bryan Lemma.  On chronic Coumadin.  Patient is also status post ventral hernia repair with mesh by Dr. Carolynne Edouard October 27, 2021.  She was seen by Dr. Carolynne Edouard for postop appointment November 22, 2021.  Patient states that since her surgery she has been having epigastric/retrosternal,left-sided chest wall pain that extends to the left upper back.  States that as she was being paired for anesthesia someone pulled on her left arm and she did have some pain in the left upper back.  I reviewed Dr. Billey Chang note from November 22, 2021 and this states that  patient's main complaint is that she pulled her left lateral chest wall muscles prior to surgery.  Patient does continue to have ongoing pain.  She was seen by primary care nurse practitioner November 23, 2021 who ordered cervical spine and left-sided rib x-rays and patient did go to the urgent care November 24, 2021.  Cervical spine x-ray report read negative for fracture or other acute finding.  Spondylitic changes C4-5 and C5-6.  Left rib films read negative.  Patient has not been in contact with her cardiologist and was last seen by him February 2022.  Denies shortness of breath.  No upper or lower extremity radiculopathy.  No specific complaints of neck pain. Review of  Systems See HPI  Objective: Vital Signs: There were no vitals taken for this visit.  Physical Exam Constitutional:      Appearance: Normal appearance.  HENT:     Head: Normocephalic.  Eyes:     Extraocular Movements: Extraocular movements intact.  Pulmonary:     Effort: No respiratory distress.  Abdominal:     Tenderness: There is abdominal tenderness (Moderate to marked epigastric tenderness with palpation and this extends along the border of the left rib cage.  Marland Kitchen).  Musculoskeletal:     Comments: Cervical spine good range of motion.  No brachial plexus or trapezius tenderness.  Left shoulder good range of motion.  Negative impingement test.  Negative drop arm test.  She has mild to moderate left-sided thoracic paraspinal tenderness.  No focal motor deficits upper or lower extremities.  Neurological:     General: No focal deficit present.     Mental Status: She is alert and oriented to person, place, and time.  Psychiatric:        Mood and Affect: Mood normal.    Ortho Exam  Specialty Comments:  No specialty comments available.  Imaging: No results found.   PMFS History: Patient Active Problem List   Diagnosis Date Noted   Aortic atherosclerosis (HCC) 11/26/2021   Prediabetes 08/15/2021   Essential hypertension 01/03/2014   Dyslipidemia, goal LDL below 130 01/03/2014   Past Medical History:  Diagnosis Date   History of bicuspid aortic valve     with severe AS and dilated aortic root/arch.   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 Jude 21 mm mechanical valve, 24 mm IMA shield vascular graft for the ascending aorta/arch), L carotid artery bypassing    Family History  Problem Relation Age of Onset   Stroke Father    Hypertension Father    Breast cancer Maternal Grandmother    Lung cancer Maternal Grandmother     Past Surgical History:  Procedure Laterality Date   ABDOMINAL HYSTERECTOMY  2000   total   ANTERIOR  CRUCIATE LIGAMENT REPAIR Left 2002   AORTIC VALVE REPLACEMENT  12/2009   St. Jude 56mm mechanical valve, Bentall procesure & L cartoid artery bypassing    BENTALL PROCEDURE  12/2009   See above   CARDIAC CATHETERIZATION  11/2009   No significant coronary disease. Normal LV function.   INSERTION OF MESH  10/27/2021   Procedure: INSERTION OF MESH;  Surgeon: Griselda Miner, MD;  Location: [facility] OR;  Service: General;;   TRANSTHORACIC ECHOCARDIOGRAM  03/2013   Normal LV size and thickness. EF 65-70%.; Grade 3 diastolic dysfunction with elevated LAP; 21 mm tilting-disc St. Jude valve functioning normally. No pannus. Mild-moderate stenosis with velocities higher than anticipated for valve type. Peak  gradient 35-39 mmHg, mean 20-24 mmHg.   TRANSTHORACIC ECHOCARDIOGRAM  1/'16, 1/19   a) 12/2014: Normal EF (60-65%), Normal wall motion. 21 mm St Jude Mech AoV Prosthesis- well seated, mild AI (not peri-valvular leak as originally noted). Peak-Mean Gradients: 35 mmHg & 22 mHg (likely size related).  Stable;; b) 12/2017: EF 60-65%.  Mild LVH.  Prosthetic aortic valve opens well.  Peak-mean gradients 36 and 19 mmHg respectively.  Appropriate for prosthesis.  Mild MR.  Trivial AI.   VENTRAL HERNIA REPAIR N/A 10/27/2021   Procedure: VENTRAL HERNIA REPAIR;  Surgeon: Griselda Miner, MD;  Location: [facility] OR;  Service: General;  Laterality: N/A;   Social History   Occupational History    Employer: [employer]  Tobacco Use   Smoking status: Former    Types: Cigarettes    Quit date: 01/01/2010    Years since quitting: 11.9   Smokeless tobacco: Never  Vaping Use   Vaping Use: Never used  Substance and Sexual Activity   Alcohol use: Yes    Alcohol/week: 30.0 standard drinks    Types: 12 Standard drinks or equivalent, 18 Cans of beer per week   Drug use: No   Sexual activity: Yes    Birth control/protection: Surgical

## 2021-11-29 NOTE — Patient Instructions (Signed)
Medication Instructions:  Your physician recommends that you continue on your current medications as directed. Please refer to the Current Medication list given to you today.  *If you need a refill on your cardiac medications before your next appointment, please call your pharmacy*   Lab Work: Have lab work at our [facility] office a few days or the day before your appointment on 01/30/22. You do not need an appointment but do not come in between 12:45 to 1:45. Hours are 8:30 am to 4:00 pm. You will need ot be fasting (no solid food after midnight). You may have water or black coffee. If you have labs (blood work) drawn today and your tests are completely normal, you will receive your results only by: MyChart Message (if you have MyChart) OR A paper copy in the mail If you have any lab test that is abnormal or we need to change your treatment, we will call you to review the results.   Testing/Procedures: None Ordered  Follow-Up: At [facility], you and your health needs are our priority.  As part of our continuing mission to provide you with exceptional heart care, we have created designated Provider Care Teams.  These Care Teams include your primary Cardiologist (physician) and Advanced Practice Providers (APPs -  Physician Assistants and Nurse Practitioners) who all work together to provide you with the care you need, when you need it.  Your next appointment:   3 month(s) on February 7 at 9:20 am. Get lab work prior to appointment.  The format for your next appointment:   In Person  Provider:   Bryan Lemma, MD

## 2021-12-01 ENCOUNTER — Other Ambulatory Visit: Payer: Self-pay | Admitting: Cardiovascular Disease

## 2021-12-05 ENCOUNTER — Encounter: Payer: Self-pay | Admitting: Internal Medicine

## 2021-12-05 ENCOUNTER — Other Ambulatory Visit: Payer: Self-pay

## 2021-12-05 ENCOUNTER — Ambulatory Visit
Admission: RE | Admit: 2021-12-05 | Discharge: 2021-12-05 | Disposition: A | Discharge status: Home / Self Care | Payer: No Typology Code available for payment source | Attending: Internal Medicine | Admitting: Internal Medicine

## 2021-12-05 ENCOUNTER — Ambulatory Visit
Admission: RE | Admit: 2021-12-05 | Discharge: 2021-12-05 | Disposition: A | Discharge status: Home / Self Care | Payer: No Typology Code available for payment source | Source: Ambulatory Visit | Attending: Internal Medicine | Admitting: Internal Medicine

## 2021-12-05 ENCOUNTER — Ambulatory Visit: Payer: No Typology Code available for payment source | Admitting: Internal Medicine

## 2021-12-05 VITALS — BP 107/64 | HR 69 | Temp 97.3°F | Resp 17 | Ht 60.0 in | Wt 121.0 lb

## 2021-12-05 DIAGNOSIS — Z0289 Encounter for other administrative examinations: Secondary | ICD-10-CM | POA: Diagnosis not present

## 2021-12-05 DIAGNOSIS — R0789 Other chest pain: Secondary | ICD-10-CM

## 2021-12-05 DIAGNOSIS — M542 Cervicalgia: Secondary | ICD-10-CM

## 2021-12-05 MED ORDER — CYCLOBENZAPRINE HCL 10 MG PO TABS: 10.0000 mg | ORAL_TABLET | Freq: Three times a day (TID) | ORAL | 0 refills | Status: DC | PRN

## 2021-12-05 MED ORDER — GABAPENTIN 300 MG PO CAPS: 300.0000 mg | ORAL_CAPSULE | Freq: Three times a day (TID) | ORAL | 0 refills | Status: DC

## 2021-12-05 NOTE — Progress Notes (Signed)
Subjective:    Patient ID: Victoria [name], female    DOB: 06-30-1961, 60 y.o.   MRN: [id number]  HPI  Pt presents to the clinic today for Urgent Care follow up. She was seen initially 11/18 for muscle strain of chest wall s/p hernia repair. Her Hydrocodone was refilled at that time and she was given a RX for Flexeril. She was seen again 12/1 for the same. Xray was obtained of the cervical spine and left side of ribs. She was advised to take Tylenol as needed for pain. Referral to orthopedics was placed. She went to [facility] 12/2 for the same. She was given Gabapentin to take for pain. She saw orthopedics 12/7. MRI thoracic spine was ordered. This has not been completed yet because patient reports the MRI was denied.  She reports persistent left-sided neck and left chest wall pain.  She describes the pain as sore and achy but can be sharp and stabbing with movement.  She denies pain, numbness, tingling in her upper extremities.  She has not noticed any rash in the area.  She reports the Gabapentin does seem to help.  She is still taking the Flexeril and would like a refill of this today.  She will need an extension of her FMLA forms.  Review of Systems     Past Medical History:  Diagnosis Date   History of bicuspid aortic valve     with severe AS and dilated aortic root/arch.   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 Jude 21 mm mechanical valve, 24 mm IMA shield vascular graft for the ascending aorta/arch), L carotid artery bypassing    Current Outpatient Medications  Medication Sig Dispense Refill   amLODipine (NORVASC) 10 MG tablet TAKE 1 TABLET BY MOUTH EVERY DAY 90 tablet 3   atorvastatin (LIPITOR) 40 MG tablet TAKE 1 TABLET BY MOUTH EVERYDAY AT BEDTIME 90 tablet 3   carvedilol (COREG) 3.125 MG tablet TAKE 1 TABLET BY MOUTH EVERY 12 HOURS 180 tablet 3   cyclobenzaprine (FLEXERIL) 10 MG tablet Take 1 tablet (10 mg total) by mouth 3 (three) times  daily as needed for muscle spasms. 30 tablet 0   gabapentin (NEURONTIN) 300 MG capsule Take 1 capsule (300 mg total) by mouth 3 (three) times daily. Start 300mg  qHS; may gradually increase to 300mg  TID 30 capsule 0   warfarin (COUMADIN) 5 MG tablet TAKE 1 TO 1 & 1/2 TABLETS BY MOUTH DAILY AS DIRECTED BY COUMADIN CLINIC 135 tablet 0   No current facility-administered medications for this visit.    Allergies  Allergen Reactions   Lisinopril     Mood changes    Family History  Problem Relation Age of Onset   Stroke Father    Hypertension Father    Breast cancer Maternal Grandmother    Lung cancer Maternal Grandmother     Social History   Socioeconomic History   Marital status: Divorced    Spouse name: Not on file   Number of children: 1   Years of education: Not on file   Highest education level: Not on file  Occupational History    Employer: [employer]  Tobacco Use   Smoking status: Former    Types: Cigarettes    Quit date: 01/01/2010    Years since quitting: 11.9   Smokeless tobacco: Never  Vaping Use   Vaping Use: Never used  Substance and Sexual Activity   Alcohol use:  Yes    Alcohol/week: 30.0 standard drinks    Types: 12 Standard drinks or equivalent, 18 Cans of beer per week   Drug use: No   Sexual activity: Yes    Birth control/protection: Surgical  Other Topics Concern   Not on file  Social History Narrative   She is a divorced mother of one, grandmother 1. She usually exercises 4-5 days a week. Very active.   Quit smoking in 2011.   Takes 1-2 beers per day and is stable.   Social Determinants of Health   Financial Resource Strain: Not on file  Food Insecurity: Not on file  Transportation Needs: Not on file  Physical Activity: Not on file  Stress: Not on file  Social Connections: Not on file  Intimate Partner Violence: Not on file     Constitutional: Denies fever, malaise, fatigue, headache or abrupt weight changes.  HEENT: Denies eye pain,  eye redness, ear pain, ringing in the ears, wax buildup, runny nose, nasal congestion, bloody nose, or sore throat. Respiratory: Denies difficulty breathing, shortness of breath, cough or sputum production.   Cardiovascular: Denies chest pain, chest tightness, palpitations or swelling in the hands or feet.  Musculoskeletal: Patient reports left-sided neck pain, left chest wall pain and decreased range of motion.  Denies difficulty with gait, or joint pain and swelling.  Skin: Denies redness, rashes, lesions or ulcercations.  Neurological: Denies dizziness, numbness, tingling, weakness or problems with balance and coordination.    No other specific complaints in a complete review of systems (except as listed in HPI above).  Objective:   Physical Exam  BP 107/64 (BP Location: Right Arm, Patient Position: Sitting, Cuff Size: Normal)   Pulse 69   Temp (!) 97.3 F (36.3 C) (Temporal)   Resp 17   Ht 5' (1.524 m)   Wt 121 lb (54.9 kg)   SpO2 100%   BMI 23.63 kg/m   Wt Readings from Last 3 Encounters:  11/29/21 121 lb 11.2 oz (55.2 kg)  11/23/21 117 lb 3.2 oz (53.2 kg)  11/10/21 117 lb 9.6 oz (53.3 kg)    General: Appears her stated age, well developed, well nourished in NAD. Skin: Warm, dry and intact. No rashes noted. HEENT: Head: normal shape and size; Eyes: PERRLA and EOMs intact;  Cardiovascular: Normal rate and rhythm. S1,S2 noted.  Click noted.  Pulmonary/Chest: Normal effort and positive vesicular breath sounds. No respiratory distress. No wheezes, rales or ronchi noted.  Musculoskeletal: Decreased flexion and extension of the cervical spine.  Normal rotation and lateral bending of the cervical spine.  No bony tenderness noted over the cervical spine.  Pain with palpation of the left paracervical muscles.  No bony tenderness noted over the thoracic spine.  Pain with palpation of the left subscapular region extending around the left side of the ribs to underneath the left breast.   Strength 5/5 BUE.  Handgrips equal.  No difficulty with gait.  Neurological: Alert and oriented.  Coordination normal.    BMET    Component Value Date/Time   NA 140 10/23/2021 1550   NA 140 02/06/2021 0929   K 4.0 10/23/2021 1550   CL 106 10/23/2021 1550   CO2 27 10/23/2021 1550   GLUCOSE 102 (H) 10/23/2021 1550   BUN 16 10/23/2021 1550   BUN 10 02/06/2021 0929   CREATININE 0.64 10/23/2021 1550   CALCIUM 9.4 10/23/2021 1550   GFRNONAA >60 10/23/2021 1550   GFRAA 114 02/06/2021 0929    Lipid Panel  Component Value Date/Time   CHOL 145 02/06/2021 0929   TRIG 77 02/06/2021 0929   HDL 70 02/06/2021 0929   CHOLHDL 2.1 02/06/2021 0929   LDLCALC 60 02/06/2021 0929    CBC    Component Value Date/Time   WBC 7.1 10/23/2021 1521   RBC 4.16 10/23/2021 1521   HGB 12.8 10/23/2021 1521   HGB 13.2 02/06/2021 0929   HCT 39.2 10/23/2021 1521   HCT 40.1 02/06/2021 0929   PLT 193 10/23/2021 1521   PLT 171 02/06/2021 0929   MCV 94.2 10/23/2021 1521   MCV 95 02/06/2021 0929   MCH 30.8 10/23/2021 1521   MCHC 32.7 10/23/2021 1521   RDW 13.4 10/23/2021 1521   RDW 12.8 02/06/2021 0929   LYMPHSABS 1.4 02/06/2021 0929   MONOABS 0.7 07/06/2011 1147   EOSABS 0.2 02/06/2021 0929   BASOSABS 0.0 02/06/2021 0929    Hgb A1C Lab Results  Component Value Date   HGBA1C 5.7 (H) 10/23/2021           Assessment & Plan:   Left-Sided Neck Pain, Left Chest Wall Pain:  X-ray cervical spine reviewed Will obtain x-ray thoracic spine today Encourage stretching and heat Gabapentin and Flexeril refilled today Referral to PT for further evaluation and treatment She will send me FMLA forms for completion to extend her out until 12/24/2021  We will follow-up after x-ray with further recommendation and treatment plan  Nicki Reaper, NP This visit occurred during the SARS-CoV-2 public health emergency.  Safety protocols were in place, including screening questions prior to the visit,  additional usage of staff PPE, and extensive cleaning of exam room while observing appropriate contact time as indicated for disinfecting solutions.

## 2021-12-05 NOTE — Patient Instructions (Signed)
Neck Exercises Ask your health care provider which exercises are safe for you. Do exercises exactly as told by your health care provider and adjust them as directed. It is normal to feel mild stretching, pulling, tightness, or discomfort as you do these exercises. Stop right away if you feel sudden pain or your pain gets worse. Do not begin these exercises until told by your health care provider. Neck exercises can be important for many reasons. They can improve strength and maintain flexibility in your neck, which will help your upper back and prevent neck pain. Stretching exercises Rotation neck stretching  Sit in a chair or stand up. Place your feet flat on the floor, shoulder-width apart. Slowly turn your head (rotate) to the right until a slight stretch is felt. Turn it all the way to the right so you can look over your right shoulder. Do not tilt or tip your head. Hold this position for 10-30 seconds. Slowly turn your head (rotate) to the left until a slight stretch is felt. Turn it all the way to the left so you can look over your left shoulder. Do not tilt or tip your head. Hold this position for 10-30 seconds. Repeat __________ times. Complete this exercise __________ times a day. Neck retraction  Sit in a sturdy chair or stand up. Look straight ahead. Do not bend your neck. Use your fingers to push your chin backward (retraction). Do not bend your neck for this movement. Continue to face straight ahead. If you are doing the exercise properly, you will feel a slight sensation in your throat and a stretch at the back of your neck. Hold the stretch for 1-2 seconds. Repeat __________ times. Complete this exercise __________ times a day. Strengthening exercises Neck press  Lie on your back on a firm bed or on the floor with a pillow under your head. Use your neck muscles to push your head down on the pillow and straighten your spine. Hold the position as well as you can. Keep your head  facing up (in a neutral position) and your chin tucked. Slowly count to 5 while holding this position. Repeat __________ times. Complete this exercise __________ times a day. Isometrics These are exercises in which you strengthen the muscles in your neck while keeping your neck still (isometrics). Sit in a supportive chair and place your hand on your forehead. Keep your head and face facing straight ahead. Do not flex or extend your neck while doing isometrics. Push forward with your head and neck while pushing back with your hand. Hold for 10 seconds. Do the sequence again, this time putting your hand against the back of your head. Use your head and neck to push backward against the hand pressure. Finally, do the same exercise on either side of your head, pushing sideways against the pressure of your hand. Repeat __________ times. Complete this exercise __________ times a day. Prone head lifts  Lie face-down (prone position), resting on your elbows so that your chest and upper back are raised. Start with your head facing downward, near your chest. Position your chin either on or near your chest. Slowly lift your head upward. Lift until you are looking straight ahead. Then continue lifting your head as far back as you can comfortably stretch. Hold your head up for 5 seconds. Then slowly lower it to your starting position. Repeat __________ times. Complete this exercise __________ times a day. Supine head lifts  Lie on your back (supine position), bending your knees   to point to the ceiling and keeping your feet flat on the floor. Lift your head slowly off the floor, raising your chin toward your chest. Hold for 5 seconds. Repeat __________ times. Complete this exercise __________ times a day. Scapular retraction  Stand with your arms at your sides. Look straight ahead. Slowly pull both shoulders (scapulae) backward and downward (retraction) until you feel a stretch between your shoulder  blades in your upper back. Hold for 10-30 seconds. Relax and repeat. Repeat __________ times. Complete this exercise __________ times a day. Contact a health care provider if: Your neck pain or discomfort gets worse when you do an exercise. Your neck pain or discomfort does not improve within 2 hours after you exercise. If you have any of these problems, stop exercising right away. Do not do the exercises again unless your health care provider says that you can. Get help right away if: You develop sudden, severe neck pain. If this happens, stop exercising right away. Do not do the exercises again unless your health care provider says that you can. This information is not intended to replace advice given to you by your health care provider. Make sure you discuss any questions you have with your health care provider. Document Revised: 06/06/2021 Document Reviewed: 06/06/2021 Elsevier Patient Education  2022 Elsevier Inc.  

## 2021-12-06 ENCOUNTER — Telehealth: Payer: Self-pay

## 2021-12-06 NOTE — Telephone Encounter (Signed)
Copied from CRM [id number]. Topic: General - Other >> Dec 05, 2021  2:30 PM Victoria [name] A wrote: Reason for CRM: The patient has called to request additional assistance with coordination of their previously denied   The patient has been directed to request a member of clinical staff call [phone] to provide additional information about the MRI  Please contact further when possible

## 2021-12-08 ENCOUNTER — Other Ambulatory Visit: Payer: Self-pay

## 2021-12-08 ENCOUNTER — Ambulatory Visit: Payer: Self-pay | Admitting: *Deleted

## 2021-12-08 VITALS — BP 119/63 | HR 71

## 2021-12-08 DIAGNOSIS — Z7901 Long term (current) use of anticoagulants: Secondary | ICD-10-CM

## 2021-12-08 DIAGNOSIS — Z952 Presence of prosthetic heart valve: Secondary | ICD-10-CM

## 2021-12-09 LAB — PROTIME-INR
INR: 3.7 — ABNORMAL HIGH (ref 0.9–1.2)
Prothrombin Time: 36.1 s — ABNORMAL HIGH (ref 9.1–12.0)

## 2021-12-10 NOTE — Telephone Encounter (Signed)
Spoke with patient via telephone 12/09/21 pain 3/10 still taking tylenol/gabapentin and flexeril alternating schedule.  PCM placed PT referral for patient and had first appt this week along with another xray of spine.  Patient reported cannot wear underwire bra as causes shooting pain.  Wearing tank top with shelf bra instead and no increase or worsening of pain.  Has improved range of motion after PT appt.  Concerned she can not go to PT as often as provider would like due to $50 copay for each visit.  Patient was given home exercises/handouts to perform at home.  Patient planning to keep weekly appt with PT provider.  Discussed don't forget to video what assistance she needs at appt with her phone so spouse can assist at home/watch video to see what is needed or have him go to appt with her if possible.  Patient on FMLA through 12/24/2021 Tim/Tonya HR notified from [facility].  Patient reported she is following up with Tonya HR also.  Discussed with patient INR elevated about goal and will need to follow up with anticoagulation clinic Monday 12/11/21.  Patient denied bruising/nosebleeds/blood in urine/stool.  Discussed most likely due to medication interactions with tylenol/gabapentin/flexeril and her coumadin.  Will need dosing change per anticoagulation clinic.  Patient to schedule next lab draw with RN Rolly Salter also after discussing with pharmacist.  Patient verbalized understanding information/instructions, agreed with plan of care and had no further questions at this time.

## 2021-12-11 ENCOUNTER — Ambulatory Visit (INDEPENDENT_AMBULATORY_CARE_PROVIDER_SITE_OTHER): Payer: No Typology Code available for payment source | Admitting: Cardiology

## 2021-12-11 DIAGNOSIS — Z5181 Encounter for therapeutic drug level monitoring: Secondary | ICD-10-CM

## 2021-12-11 NOTE — Telephone Encounter (Signed)
Anticoagulation clinic instructed patient to hold one dose and repeat labs in 3 weeks.  Patient scheduled with RN Rolly Salter.

## 2021-12-11 NOTE — Progress Notes (Signed)
Anticoag clinic has spoken with pt today per [facility] review. Pt to hold today's dose of coumadin, then continue regular dosing, and recheck in 3 weeks. Appt made for 12/28/20 at emailed to pt's work and personal email addresses.

## 2021-12-11 NOTE — Progress Notes (Signed)
Noted patient to hold one dose and repeat labs in 3 weeks.

## 2021-12-13 NOTE — Telephone Encounter (Signed)
Notified by HR Tonya 12/12/21 patient worker's compensation claim denied.

## 2021-12-20 ENCOUNTER — Telehealth: Payer: Self-pay | Admitting: Internal Medicine

## 2021-12-20 NOTE — Telephone Encounter (Signed)
Okay for work note to release her back to work.  I do not know that we can send this to grand over to be printed and picked up.  She can print a copy off of her MyChart if she does not want to come to this office to pick it up.

## 2021-12-20 NOTE — Telephone Encounter (Signed)
Patient contacted via telephone stated symptoms improving with PT but still having worst pain upon getting out of bed in am.  Hasn't scheduled follow up appt with PCM yet for RTW clearance as current disability/FMLA expires 12/23/21.  Reminded patient to call [facility] office to schedule appt tomorrow.  Patient stated since symptoms improving not going to pursue scheduling MRI at this time.  Patient stated AROM improving and pain decreased most of the day.  She feels she could return to work now.  Discussed I cannot clear her to return to work and she needs to schedule with her PCM.  General surgery has cleared her to return to work after hernia repair/refused to extend any restrictions on activity or send in extension for the [employer] disability claim.  HR Tonya notified PCM appt/clearance to RTW pending patient scheduling appt.  Patient A&Ox3 spoke full sentences without difficulty; no cough/congestion/throat clearing audible during 6 minute call.  Patient verbalized understanding information/instructions, agreed with plan of care and had no further questions at this time.

## 2021-12-20 NOTE — Telephone Encounter (Signed)
Pt is requesting a note to return to work note. She is planning to go back 12/25/2021. Pt has been out on FMLA. She would like to pick up note at the [facility] [place] office.  Please call [phone]

## 2021-12-22 NOTE — Telephone Encounter (Signed)
°  °  [facility] [address] [place], [place]  [address] Phone:  [phone]   Fax:  [phone]   December 20, 2021    Patient: Victoria [name]  Date of Birth: 1961-01-20  Date of Visit: 12/20/2021      To Whom it May Concern:   [name] was seen in my clinic on 12/05/2021. She is release to return to work on Monday, January 2nd. If any questions or concerns, please do not hesitate to give the office a call.    Sincerely,        Nicki Reaper, NP  Copy sent to HR Archie Patten and [employer] notified.

## 2021-12-26 NOTE — Telephone Encounter (Signed)
Patient contacted via telephone stated returned to work yesterday without difficulty.  Patient reported doing her stretches when she wakes up in the am prior to coming to work.  Has physical therapy two more appts 5 & 04 Jan 2022.  Patient denied questions or concerns at this time.  Patient will follow up as needed with her PCM.  Encounter closed.

## 2021-12-28 ENCOUNTER — Other Ambulatory Visit: Payer: Self-pay

## 2021-12-28 ENCOUNTER — Ambulatory Visit: Payer: Self-pay | Admitting: *Deleted

## 2021-12-28 VITALS — BP 122/64

## 2021-12-28 DIAGNOSIS — Z952 Presence of prosthetic heart valve: Secondary | ICD-10-CM

## 2021-12-28 DIAGNOSIS — Z7901 Long term (current) use of anticoagulants: Secondary | ICD-10-CM

## 2021-12-28 NOTE — Progress Notes (Signed)
Anticoag clinic CC'd on results. 

## 2021-12-29 ENCOUNTER — Ambulatory Visit (INDEPENDENT_AMBULATORY_CARE_PROVIDER_SITE_OTHER): Payer: No Typology Code available for payment source | Admitting: Cardiology

## 2021-12-29 DIAGNOSIS — Z5181 Encounter for therapeutic drug level monitoring: Secondary | ICD-10-CM | POA: Diagnosis not present

## 2021-12-29 LAB — PROTIME-INR
INR: 2.2 — ABNORMAL HIGH (ref 0.9–1.2)
Prothrombin Time: 22.5 s — ABNORMAL HIGH (ref 9.1–12.0)

## 2022-01-10 ENCOUNTER — Other Ambulatory Visit: Payer: Self-pay | Admitting: Internal Medicine

## 2022-01-10 DIAGNOSIS — Z1231 Encounter for screening mammogram for malignant neoplasm of breast: Secondary | ICD-10-CM

## 2022-01-15 ENCOUNTER — Ambulatory Visit
Admission: RE | Admit: 2022-01-15 | Discharge: 2022-01-15 | Disposition: A | Discharge status: Home / Self Care | Payer: No Typology Code available for payment source | Source: Ambulatory Visit | Attending: Internal Medicine | Admitting: Internal Medicine

## 2022-01-15 ENCOUNTER — Other Ambulatory Visit: Payer: Self-pay

## 2022-01-15 DIAGNOSIS — Z1231 Encounter for screening mammogram for malignant neoplasm of breast: Secondary | ICD-10-CM

## 2022-01-16 NOTE — Telephone Encounter (Signed)
Patient seen in workcenter has completed PT and still doing home exercises.  Occasional flexeril use but has stopped all other medications.  Still has discomfort at times but no new or worsening symptoms.  Stated able to perform her duties at work.  Denied further needs at this time.

## 2022-01-18 ENCOUNTER — Telehealth: Payer: Self-pay

## 2022-01-18 NOTE — Telephone Encounter (Signed)
Lpm to have INR lab drawn ?

## 2022-01-19 ENCOUNTER — Telehealth: Payer: Self-pay

## 2022-01-19 NOTE — Telephone Encounter (Signed)
Lpm to draw INR

## 2022-01-22 ENCOUNTER — Other Ambulatory Visit: Payer: Self-pay

## 2022-01-22 ENCOUNTER — Other Ambulatory Visit: Payer: Self-pay | Admitting: *Deleted

## 2022-01-22 VITALS — BP 115/76 | HR 80

## 2022-01-22 DIAGNOSIS — Z952 Presence of prosthetic heart valve: Secondary | ICD-10-CM

## 2022-01-22 DIAGNOSIS — Z7901 Long term (current) use of anticoagulants: Secondary | ICD-10-CM

## 2022-01-23 ENCOUNTER — Ambulatory Visit (INDEPENDENT_AMBULATORY_CARE_PROVIDER_SITE_OTHER): Payer: No Typology Code available for payment source | Admitting: Cardiovascular Disease

## 2022-01-23 DIAGNOSIS — Z5181 Encounter for therapeutic drug level monitoring: Secondary | ICD-10-CM | POA: Diagnosis not present

## 2022-01-23 LAB — PROTIME-INR
INR: 4.1 — ABNORMAL HIGH (ref 0.9–1.2)
Prothrombin Time: 40.3 s — ABNORMAL HIGH (ref 9.1–12.0)

## 2022-01-23 NOTE — Patient Instructions (Addendum)
Description   Called and spoke to pt and instructed her to hold warfarin today and take 1 tablet of warfarin tomorrow, then continue to take warfarin 1.5 tablets daily except for 1 tablet on Sundays. Eat an extra serving of greens today. Recheck INR in 1.5 weeks. Coumadin Clinic [phone]

## 2022-01-25 ENCOUNTER — Other Ambulatory Visit: Payer: Self-pay

## 2022-01-25 ENCOUNTER — Other Ambulatory Visit: Payer: Self-pay | Admitting: *Deleted

## 2022-01-25 DIAGNOSIS — R7401 Elevation of levels of liver transaminase levels: Secondary | ICD-10-CM

## 2022-01-25 DIAGNOSIS — Z0000 Encounter for general adult medical examination without abnormal findings: Secondary | ICD-10-CM

## 2022-01-25 DIAGNOSIS — R7303 Prediabetes: Secondary | ICD-10-CM

## 2022-01-25 DIAGNOSIS — R7402 Elevation of levels of lactic acid dehydrogenase (LDH): Secondary | ICD-10-CM

## 2022-01-25 DIAGNOSIS — Z7901 Long term (current) use of anticoagulants: Secondary | ICD-10-CM

## 2022-01-25 DIAGNOSIS — R17 Unspecified jaundice: Secondary | ICD-10-CM

## 2022-01-25 DIAGNOSIS — Z952 Presence of prosthetic heart valve: Secondary | ICD-10-CM

## 2022-01-25 NOTE — Progress Notes (Signed)
Fasting labs per cardiology orders. Orders modified to exec panel to cover Be Well labs as well.

## 2022-01-26 ENCOUNTER — Encounter: Payer: Self-pay | Admitting: Registered Nurse

## 2022-01-26 LAB — CMP12+LP+TP+TSH+6AC+CBC/D/PLT
ALT: 33 IU/L — ABNORMAL HIGH (ref 0–32)
AST: 37 IU/L (ref 0–40)
Albumin/Globulin Ratio: 1.8 (ref 1.2–2.2)
Albumin: 4.7 g/dL (ref 3.8–4.9)
Alkaline Phosphatase: 87 IU/L (ref 44–121)
BUN/Creatinine Ratio: 23 (ref 9–23)
BUN: 14 mg/dL (ref 6–24)
Basophils Absolute: 0 10*3/uL (ref 0.0–0.2)
Basos: 1 %
Bilirubin Total: 1.5 mg/dL — ABNORMAL HIGH (ref 0.0–1.2)
Calcium: 9.3 mg/dL (ref 8.7–10.2)
Chloride: 99 mmol/L (ref 96–106)
Chol/HDL Ratio: 2.4 ratio (ref 0.0–4.4)
Cholesterol, Total: 166 mg/dL (ref 100–199)
Creatinine, Ser: 0.61 mg/dL (ref 0.57–1.00)
EOS (ABSOLUTE): 0.2 10*3/uL (ref 0.0–0.4)
Eos: 3 %
Estimated CHD Risk: <0.5 times avg. (ref 0.0–1.0)
Free Thyroxine Index: 1.6 (ref 1.2–4.9)
GGT: 26 IU/L (ref 0–60)
Globulin, Total: 2.6 g/dL (ref 1.5–4.5)
Glucose: 90 mg/dL (ref 70–99)
HDL: 69 mg/dL (ref >39)
Hematocrit: 37.5 % (ref 34.0–46.6)
Hemoglobin: 12.4 g/dL (ref 11.1–15.9)
Immature Grans (Abs): 0 10*3/uL (ref 0.0–0.1)
Immature Granulocytes: 0 %
Iron: 106 ug/dL (ref 27–159)
LDH: 336 IU/L — ABNORMAL HIGH (ref 119–226)
LDL Chol Calc (NIH): 77 mg/dL (ref 0–99)
Lymphocytes Absolute: 1.5 10*3/uL (ref 0.7–3.1)
Lymphs: 27 %
MCH: 30.8 pg (ref 26.6–33.0)
MCHC: 33.1 g/dL (ref 31.5–35.7)
MCV: 93 fL (ref 79–97)
Monocytes Absolute: 0.7 10*3/uL (ref 0.1–0.9)
Monocytes: 13 %
Neutrophils Absolute: 3.1 10*3/uL (ref 1.4–7.0)
Neutrophils: 56 %
Phosphorus: 3.5 mg/dL (ref 3.0–4.3)
Platelets: 188 10*3/uL (ref 150–450)
Potassium: 4.4 mmol/L (ref 3.5–5.2)
RBC: 4.02 x10E6/uL (ref 3.77–5.28)
RDW: 13.1 % (ref 11.7–15.4)
Sodium: 137 mmol/L (ref 134–144)
T3 Uptake Ratio: 25 % (ref 24–39)
T4, Total: 6.3 ug/dL (ref 4.5–12.0)
TSH: 1.75 u[IU]/mL (ref 0.450–4.500)
Total Protein: 7.3 g/dL (ref 6.0–8.5)
Triglycerides: 116 mg/dL (ref 0–149)
Uric Acid: 5.5 mg/dL (ref 3.0–7.2)
VLDL Cholesterol Cal: 20 mg/dL (ref 5–40)
WBC: 5.4 10*3/uL (ref 3.4–10.8)
eGFR: 103 mL/min/{1.73_m2} (ref >59)

## 2022-01-26 LAB — HGB A1C W/O EAG: Hgb A1c MFr Bld: 6 % — ABNORMAL HIGH (ref 4.8–5.6)

## 2022-01-26 NOTE — Progress Notes (Signed)
RN Rolly Salter notified me Vitamin D added via telephone; noted results routed to [facility] and cardiology also.

## 2022-01-26 NOTE — Addendum Note (Signed)
Addended by: Albina Billet A on: 01/26/2022 09:38 AM   Modules accepted: Orders

## 2022-01-26 NOTE — Progress Notes (Signed)
Results routed to pcp and cardiology. [facility] called and Vitamin D added for testing.

## 2022-01-27 ENCOUNTER — Encounter: Payer: Self-pay | Admitting: Registered Nurse

## 2022-01-27 ENCOUNTER — Telehealth: Payer: Self-pay | Admitting: Registered Nurse

## 2022-01-27 DIAGNOSIS — R7989 Other specified abnormal findings of blood chemistry: Secondary | ICD-10-CM

## 2022-01-27 NOTE — Telephone Encounter (Signed)
See results note dated today  Telephone message left for patient to contact NP.   My chart message also sent  Victoria [name],    Follow up with PCM low Vitamin D; [employer] handout sent to my chart on low vitamin D and I recommend starting supplement cholecalciferol 50,000 units by mouth weekly x 12 weeks and repeat nonfasting Vitamin D level in 3 months.   I can send in Rx if you let me know your preferred pharmacy.  I recommend 15 minutes sunlight on skin daily.  Take Vitamin D supplement with fattiest meal for best absorption.   Please let us know if you have further questions or concerns.     Sincerely,    Albina Billet NP-C

## 2022-01-28 LAB — SPECIMEN STATUS REPORT

## 2022-01-28 LAB — VITAMIN D 25 HYDROXY (VIT D DEFICIENCY, FRACTURES): Vit D, 25-Hydroxy: 19.4 ng/mL — ABNORMAL LOW (ref 30.0–100.0)

## 2022-01-30 ENCOUNTER — Other Ambulatory Visit: Payer: Self-pay

## 2022-01-30 ENCOUNTER — Encounter: Payer: Self-pay | Admitting: Cardiology

## 2022-01-30 ENCOUNTER — Ambulatory Visit: Payer: No Typology Code available for payment source | Admitting: Cardiology

## 2022-01-30 VITALS — BP 132/78 | HR 65 | Ht 60.0 in | Wt 127.2 lb

## 2022-01-30 DIAGNOSIS — I10 Essential (primary) hypertension: Secondary | ICD-10-CM

## 2022-01-30 DIAGNOSIS — E785 Hyperlipidemia, unspecified: Secondary | ICD-10-CM | POA: Diagnosis not present

## 2022-01-30 DIAGNOSIS — Z7901 Long term (current) use of anticoagulants: Secondary | ICD-10-CM

## 2022-01-30 DIAGNOSIS — I700 Atherosclerosis of aorta: Secondary | ICD-10-CM

## 2022-01-30 DIAGNOSIS — Z952 Presence of prosthetic heart valve: Secondary | ICD-10-CM

## 2022-01-30 MED ORDER — CHOLECALCIFEROL 1.25 MG (50000 UT) PO TABS: 1.0000 | ORAL_TABLET | ORAL | 0 refills | Status: AC

## 2022-01-30 NOTE — Assessment & Plan Note (Signed)
On warfarin for mechanical AVR.  Monitored by [facility] warfarin clinic.  Unfortunately, no indication or data to support conversion to DOAC.

## 2022-01-30 NOTE — Assessment & Plan Note (Signed)
Atherosclerosis noted on CT and status post aortic root replacement.  She is on appropriate medications for blood pressure control and lipid management with amlodipine 10 mg plus carvedilol 3.125 mg twice daily along with atorvastatin 40 mg. BP relatively well controlled as are cholesterol levels.

## 2022-01-30 NOTE — Progress Notes (Signed)
Spoke with pt by phone. She is agreeable to starting Vit D supplement. Rx can go to [facility] on [address]. Appt made and emailed to pt for nonfasting labs for repeat Vit D, HFP, A1c, and if indicated at that time, will draw INR as well, for 04/30/22.

## 2022-01-30 NOTE — Assessment & Plan Note (Addendum)
Status post Bentall procedure with 21 mm Saint Jude mechanical valve replacement as well as a 24 mm IMA shield vascular graft for the ascending aorta/arch.  This also included left carotid artery bypassing.  Follow-up echo seems to be relatively stable.  Due for repeat echo in 2025.  Can order it prior to completion of annual follow-up next year.  With mechanical valve, she is on warfarin.  No major bleeding issues, just mild occasional epistaxis.  Unfortunately with mechanical valves, there is no indication to convert from warfarin to DOAC.  With mechanical AVR, the need for bridging is less significant, but however would still want to bridge if she would be off warfarin for more than 5 days

## 2022-01-30 NOTE — Assessment & Plan Note (Signed)
With aortic valve disease, aortic atherosclerosis with a baseline cardiovascular risk reduction, she is on moderate-high-dose atorvastatin.  Tolerating well.  LDL was 60 as of February 2022.  With most recent labs showing LDL 77.  This is an increase, but may have had some to do with her change in dietary habits.  Follow-up closely, would not want to see the LDL level to be up too much further than it is now.  May need to be more aggressive with management.

## 2022-01-30 NOTE — Patient Instructions (Signed)

## 2022-01-30 NOTE — Telephone Encounter (Signed)
RN Rolly Salter spoke with patient see results note.  Agreeable to starting supplement Vitamin D and labs scheduled for May 2023.  Electronic Rx cholecalciferol 50,000 units po weekly x 12 weeks #12 RF0

## 2022-01-30 NOTE — Progress Notes (Signed)
(Notable for bicuspid aortic valve with severe AS-s/p AVR/BentallNotable for congenital bicuspid aortic valve disease with severe arctic stenosis status post AVR Bentall  Primary Care Provider: Jearld Fenton, NP Cardiologist: Glenetta Hew, MD Electrophysiologist: None  Clinic Note: Chief Complaint  Patient presents with   Follow-up    2 months from NP visit to evaluate MSK chest pain.  Annual follow-up for valve monitoring.   Cardiac Valve Problem    Status post mechanical AVR.  Doing well.    ===================================  ASSESSMENT/PLAN   Problem List Items Addressed This Visit       Cardiology Problems   Essential hypertension (Chronic)    Blood pressure 130/78.  A little month borderline side, still stable.  She is on 10 mg amlodipine along with 3.125 mg twice daily carvedilol.  We will probably have room to increase up carvedilol to 6.25, but resting heart rate is in the mid 60s.  As such, we will hold off on further titration.      Hyperlipidemia with target LDL less than 100 (Chronic)    With aortic valve disease, aortic atherosclerosis with a baseline cardiovascular risk reduction, she is on moderate-high-dose atorvastatin.  Tolerating well.  LDL was 60 as of February 2022.  With most recent labs showing LDL 77.  This is an increase, but may have had some to do with her change in dietary habits.  Follow-up closely, would not want to see the LDL level to be up too much further than it is now.  May need to be more aggressive with management.      Aortic atherosclerosis (HCC) (Chronic)    Atherosclerosis noted on CT and status post aortic root replacement.  She is on appropriate medications for blood pressure control and lipid management with amlodipine 10 mg plus carvedilol 3.125 mg twice daily along with atorvastatin 40 mg. BP relatively well controlled as are cholesterol levels.        Other   Chronic anticoagulation (Chronic)    On warfarin for mechanical  AVR.  Monitored by [facility] warfarin clinic.  Unfortunately, no indication or data to support conversion to DOAC.      S/P AVR - Primary (Chronic)    Status post Bentall procedure with 21 mm Saint Jude mechanical valve replacement as well as a 24 mm IMA shield vascular graft for the ascending aorta/arch.  This also included left carotid artery bypassing.  Follow-up echo seems to be relatively stable.  Due for repeat echo in 2025.  Can order it prior to completion of annual follow-up next year.  With mechanical valve, she is on warfarin.  No major bleeding issues, just mild occasional epistaxis.  Unfortunately with mechanical valves, there is no indication to convert from warfarin to DOAC.  With mechanical AVR, the need for bridging is less significant, but however would still want to bridge if she would be off warfarin for more than 5 days       ===================================  HPI:    Victoria [name] is a 61 y.o. female with a PMH notable for-.Congenital bicuspid aortic valve/severe AAS-s/p AVR/Bentall (21 mm Saint Jude mechanical valve and left carotid bypass in January 2011) along with HTN, HLD and anticoagulation who presents today for 43-monthfollow-up after chest pain evaluation.  I last saw her in February 2022 for routine follow-up.  She was doing well.  No changes made.  Recent Hospitalizations:  10/27/2021: Ventral hernia repair with mesh insertion Followed by latissimus dorsi muscle tear 11/24/2021: ER visit  for chest wall pain.  Victoria [name] was last seen on November 29, 2021 by Christen Bame, NP at our [facility] office for evaluation of chest pain.  Chest pain is felt to be atypical for angina.  Mostly along the ribs of left chest - MSK (most likely related to muscle strain of the chest wall..  Recommended taking NSAIDs and Tylenol.  If symptoms persist, could consider stress test versus coronary CTA.   Reviewed  CV studies:    The following studies were reviewed  today: (if available, images/films reviewed: From Epic Chart or Care Everywhere) TTE 01/27/2021: EF 60 to 65%.  No RWMA.  Unable to assess LV diastolic parameters because of MAC.  RVP seems normal.  Normal function.  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  21 mm Saint Jude mechanical aortic valve in place.  Aortic root has been replaced.  Mild eccentric AI noted.  Mean gradient 23.8 mmHg.  Peak 46.9 mmHg.   Interval History:   Victoria [name] returns now for 61-monthfollow-up and really annual follow-up to see me.  Doing fine.  No major issues.  The chest discomfort she notices really along the left lower rib cage worse with coughing and certain movements.  Deep breath makes it hurt. Otherwise from a cardiac standpoint she is very stable.  No chest pain or pressure with rest or exertion.  No heart failure symptoms of PND orthopnea edema.  No arrhythmias.  No syncope or near syncope. She tolerates warfarin relatively well but does have some occasional nosebleeds otherwise no melena, hematochezia or hematuria.  Overall recovered from her surgery pretty well, but it is most likely that chest wall discomfort is probably due to muscle strain following her surgery.  Likely abdominal muscle versus intercostal muscle strain.  REVIEWED OF SYSTEMS   Review of Systems  Constitutional:  Negative for malaise/fatigue and weight loss.  HENT:  Positive for nosebleeds (Occasional). Negative for congestion.   Respiratory:  Negative for cough, hemoptysis and shortness of breath.   Cardiovascular:  Positive for chest pain (Chest wall pain.  Noted per HPI).  Gastrointestinal:  Negative for blood in stool and melena.  Genitourinary:  Negative for hematuria.  Musculoskeletal:  Negative for joint pain.  Neurological: Negative.   Psychiatric/Behavioral: Negative.     I have reviewed and (if needed) personally updated the patient's problem list, medications, allergies, past medical and surgical history,  social and family history.   PAST MEDICAL HISTORY   Past Medical History:  Diagnosis Date   History of bicuspid aortic valve     with severe AS and dilated aortic root/arch.   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 Jude 21 mm mechanical valve, 24 mm IMA shield vascular graft for the ascending aorta/arch), L carotid artery bypassing    PAST SURGICAL HISTORY   Past Surgical History:  Procedure Laterality Date   ABDOMINAL HYSTERECTOMY  2000   total   ANTERIOR CRUCIATE LIGAMENT REPAIR Left 2002   AORTIC VALVE REPLACEMENT  12/2009   St. Jude 280mmechanical valve, Bentall procesure & L cartoid artery bypassing    BENTALL PROCEDURE  12/2009   See above   CARDIAC CATHETERIZATION  11/2009   No significant coronary disease. Normal LV function.   INSERTION OF MESH  10/27/2021   Procedure: INSERTION OF MESH;  Surgeon: ToJovita KussmaulMD;  [facility] [facility] Service: General;;   TRANSTHORACIC ECHOCARDIOGRAM  01/27/2021   EF 60 to 65%.  No RWMA.  Unable to assess LV diastolic parameters because of MAC.  RVP seems normal.  Normal function.  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  21 mm Saint Jude mechanical aortic valve in place.  Mean gradient 23.8 mmHg.  Peak 46.9 mmHg. Aortic root has been replaced.  Mild eccentric AI noted.   TRANSTHORACIC ECHOCARDIOGRAM  1/'16, 1/19   a) 12/2014: Normal EF (60-65%), Normal wall motion. 21 mm St Jude Mech AoV Prosthesis- well seated, mild AI (not peri-valvular leak as originally noted). Peak-Mean Gradients: 35 mmHg & 22 mHg (likely size related).  Stable;; b) 12/2017: EF 60-65%.  Mild LVH.  Prosthetic aortic valve opens well.  Peak-mean gradients 36 and 19 mmHg respectively.  Appropriate for prosthesis.  Mild MR.  Trivial AI.   VENTRAL HERNIA REPAIR N/A 10/27/2021   Procedure: VENTRAL HERNIA REPAIR;  Surgeon: Jovita Kussmaul, MD;  Location: [place];  Service: General;  Laterality: N/A;    Immunization  History  Administered Date(s) Administered   Influenza,inj,Quad PF,6+ Mos 11/02/2016, 10/17/2017, 11/03/2019, 09/26/2021   Influenza-Unspecified 10/06/2020   PFIZER Comirnaty(Gray Top)Covid-19 Tri-Sucrose Vaccine 03/13/2020, 04/04/2020, 11/26/2020   Pfizer Covid-19 Vaccine Bivalent Booster 89yr & up 09/26/2021    MEDICATIONS/ALLERGIES   Current Meds  Medication Sig   amLODipine (NORVASC) 10 MG tablet TAKE 1 TABLET BY MOUTH EVERY DAY   atorvastatin (LIPITOR) 40 MG tablet TAKE 1 TABLET BY MOUTH EVERYDAY AT BEDTIME   carvedilol (COREG) 3.125 MG tablet TAKE 1 TABLET BY MOUTH EVERY 12 HOURS   cyclobenzaprine (FLEXERIL) 10 MG tablet Take 1 tablet (10 mg total) by mouth 3 (three) times daily as needed for muscle spasms.   gabapentin (NEURONTIN) 300 MG capsule Take 1 capsule (300 mg total) by mouth 3 (three) times daily. Start 3075mqHS; may gradually increase to 30061mID   warfarin (COUMADIN) 5 MG tablet TAKE 1 TO 1 & 1/2 TABLETS BY MOUTH DAILY AS DIRECTED BY COUMADIN CLINIC    Allergies  Allergen Reactions   Lisinopril     Mood changes    SOCIAL HISTORY/FAMILY HISTORY   Reviewed in Epic:  Pertinent findings:  Social History   Tobacco Use   Smoking status: Former    Types: Cigarettes    Quit date: 01/01/2010    Years since quitting: 12.0   Smokeless tobacco: Never  Vaping Use   Vaping Use: Never used  Substance Use Topics   Alcohol use: Yes    Alcohol/week: 30.0 standard drinks    Types: 12 Standard drinks or equivalent, 18 Cans of beer per week   Drug use: No   Social History   Social History Narrative   She is a divorced mother of one, grandmother 1. 22he usually exercises 4-5 days a week. Very active.   Quit smoking in 2011.   Takes 1-2 beers per day and is stable.    OBJCTIVE -PE, EKG, labs   Wt Readings from Last 3 Encounters:  01/30/22 127 lb 3.2 oz (57.7 kg)  12/05/21 121 lb (54.9 kg)  11/29/21 121 lb 11.2 oz (55.2 kg)    Physical Exam: BP 132/78    Pulse  65    Ht 5' (1.524 m)    Wt 127 lb 3.2 oz (57.7 kg)    SpO2 98%    BMI 24.84 kg/m  Physical Exam Vitals reviewed.  Constitutional:      General: She is not in acute distress.  Appearance: Normal appearance. She is normal weight. She is not toxic-appearing or diaphoretic.  HENT:     Head: Normocephalic and atraumatic.  Neck:     Vascular: No carotid bruit (Radiated aortic murmur) or JVD.  Cardiovascular:     Rate and Rhythm: Normal rate and regular rhythm. No extrasystoles are present.    Pulses: Normal pulses.     Heart sounds: Murmur heard.  High-pitched harsh crescendo-decrescendo midsystolic murmur is present with a grade of 3/6 at the upper right sternal border radiating to the neck.    No friction rub. No gallop.     Comments: Normal S1 and metallic S2 Pulmonary:     Effort: Pulmonary effort is normal. No respiratory distress.     Breath sounds: Normal breath sounds. No wheezing or rales.  Chest:     Chest wall: Tenderness (Along the lower 2 ribs of the left rib cage.  From sternum across to the mid axillary line.) present.  Musculoskeletal:        General: No swelling. Normal range of motion.     Cervical back: Normal range of motion and neck supple.  Skin:    General: Skin is warm and dry.  Neurological:     General: No focal deficit present.     Mental Status: She is alert and oriented to person, place, and time.  Psychiatric:        Mood and Affect: Mood normal.        Behavior: Behavior normal.        Thought Content: Thought content normal.        Judgment: Judgment normal.      Adult ECG Report  N/a  Recent Labs:  noted a bit of dietary indiscretion - - increased PO with Gabapentin Lab Results  Component Value Date   CHOL 166 01/25/2022   HDL 69 01/25/2022   LDLCALC 77 01/25/2022   TRIG 116 01/25/2022   CHOLHDL 2.4 01/25/2022   Lab Results  Component Value Date   CREATININE 0.61 01/25/2022   BUN 14 01/25/2022   NA 137 01/25/2022   K 4.4 01/25/2022    CL 99 01/25/2022   CO2 27 10/23/2021   CBC Latest Ref Rng & Units 01/25/2022 10/23/2021 02/06/2021  WBC 3.4 - 10.8 x10E3/uL 5.4 7.1 7.3  Hemoglobin 11.1 - 15.9 g/dL 12.4 12.8 13.2  Hematocrit 34.0 - 46.6 % 37.5 39.2 40.1  Platelets 150 - 450 x10E3/uL 188 193 171    Lab Results  Component Value Date   HGBA1C 6.0 (H) 01/25/2022   Lab Results  Component Value Date   TSH 1.750 01/25/2022    ==================================================  COVID-19 Education: The signs and symptoms of COVID-19 were discussed with the patient and how to seek care for testing (follow up with PCP or arrange E-visit).    I spent a total of 20 minutes with the patient spent in direct patient consultation.  Additional time spent with chart review  / charting (studies, outside notes, etc): 16 min Total Time: 36 min  Current medicines are reviewed at length with the patient today.  (+/- concerns) none  This visit occurred during the SARS-CoV-2 public health emergency.  Safety protocols were in place, including screening questions prior to the visit, additional usage of staff PPE, and extensive cleaning of exam room while observing appropriate contact time as indicated for disinfecting solutions.  Notice: This dictation was prepared with Dragon dictation along with smart phrase technology. Any transcriptional errors that result from this  process are unintentional and may not be corrected upon review.  Studies Ordered:   No orders of the defined types were placed in this encounter.   Patient Instructions / Medication Changes & Studies & Tests Ordered   Patient Instructions  Medication Instructions:   No changes  *If you need a refill on your cardiac medications before your next appointment, please call your pharmacy*   Lab Work: Not needed    Testing/Procedures: Not needed   Follow-Up: At [facility], you and your health needs are our priority.  As part of our continuing mission to  provide you with exceptional heart care, we have created designated Provider Care Teams.  These Care Teams include your primary Cardiologist (physician) and Advanced Practice Providers (APPs -  Physician Assistants and Nurse Practitioners) who all work together to provide you with the care you need, when you need it.     Your next appointment:   12 month(s)  The format for your next appointment:   In Person  Provider:   Glenetta Hew, MD         Glenetta Hew, M.D., M.S. Interventional Cardiologist   Pager # [phone] Phone # [phone] [address] [place], [place] [address]   Thank you for choosing Heartcare at [facility]!!

## 2022-01-30 NOTE — Assessment & Plan Note (Signed)
Blood pressure 130/78.  A little month borderline side, still stable.  She is on 10 mg amlodipine along with 3.125 mg twice daily carvedilol.  We will probably have room to increase up carvedilol to 6.25, but resting heart rate is in the mid 60s.  As such, we will hold off on further titration.

## 2022-02-02 ENCOUNTER — Other Ambulatory Visit: Payer: Self-pay

## 2022-02-02 ENCOUNTER — Other Ambulatory Visit: Payer: Self-pay | Admitting: *Deleted

## 2022-02-02 DIAGNOSIS — Z7901 Long term (current) use of anticoagulants: Secondary | ICD-10-CM

## 2022-02-02 DIAGNOSIS — Z952 Presence of prosthetic heart valve: Secondary | ICD-10-CM

## 2022-02-03 LAB — PROTIME-INR
INR: 3.3 — ABNORMAL HIGH (ref 0.9–1.2)
Prothrombin Time: 32.3 s — ABNORMAL HIGH (ref 9.1–12.0)

## 2022-02-05 ENCOUNTER — Ambulatory Visit (INDEPENDENT_AMBULATORY_CARE_PROVIDER_SITE_OTHER): Payer: No Typology Code available for payment source | Admitting: Internal Medicine

## 2022-02-05 DIAGNOSIS — Z7901 Long term (current) use of anticoagulants: Secondary | ICD-10-CM

## 2022-02-05 DIAGNOSIS — Z952 Presence of prosthetic heart valve: Secondary | ICD-10-CM

## 2022-02-05 NOTE — Progress Notes (Signed)
Reviewed Epic agreed with plan of care/noted.

## 2022-02-05 NOTE — Progress Notes (Signed)
Per anticoag clinic, no dosing changes but to eat an extra serving of greens today. Pt confirmed she has spoken with clinic and understood instructions. Appt made for 1000 02/16/22 for recheck.

## 2022-02-06 ENCOUNTER — Telehealth: Payer: Self-pay | Admitting: Registered Nurse

## 2022-02-06 ENCOUNTER — Encounter: Payer: Self-pay | Admitting: Registered Nurse

## 2022-02-06 DIAGNOSIS — M62838 Other muscle spasm: Secondary | ICD-10-CM

## 2022-02-06 MED ORDER — CYCLOBENZAPRINE HCL 10 MG PO TABS: 10.0000 mg | ORAL_TABLET | Freq: Three times a day (TID) | ORAL | 0 refills | Status: AC | PRN

## 2022-02-06 NOTE — Telephone Encounter (Signed)
Per chart review last [facility] fill 11/07/21 #30 and civilian pharmacy fill during convalescent leave 12/05/21 by [facility] #90 RF0 for muscle spams prn use left shoulder/neck.  Filled today from [facility] #30 RF0  Patient to have follow up appt if will need another refill after this.  RN Rolly Salter notified and will discuss with patient on medication pickup.

## 2022-02-07 NOTE — Telephone Encounter (Signed)
Patient notified [facility] fill ready for pick up.  Patient asked if flexeril can interact with her warfarin to cause more bleeding.  Discussed with patient from what I have read in Epocrates and Up to Date there is no known interaction.  Per up to date acetaminophen doses above 1.3 or 2g per day for multiple consecutive days can enhance anticoagulation effect.  Vitamin E may increase warfarin effect along with cranberry juice or alcohol intake.  Patient stated typically taking only 1 dose of flexeril in the am when pain the worst and she needs to get loosened up and start her day/do stretches to loosen up prior to lifting product at work.  Patient notified and had no further questions at this time.

## 2022-02-19 ENCOUNTER — Ambulatory Visit: Payer: Self-pay | Admitting: *Deleted

## 2022-02-19 VITALS — BP 124/76 | HR 77

## 2022-02-19 DIAGNOSIS — Z7901 Long term (current) use of anticoagulants: Secondary | ICD-10-CM

## 2022-02-19 DIAGNOSIS — Z952 Presence of prosthetic heart valve: Secondary | ICD-10-CM

## 2022-02-20 ENCOUNTER — Ambulatory Visit (INDEPENDENT_AMBULATORY_CARE_PROVIDER_SITE_OTHER): Payer: No Typology Code available for payment source | Admitting: Cardiology

## 2022-02-20 DIAGNOSIS — Z5181 Encounter for therapeutic drug level monitoring: Secondary | ICD-10-CM

## 2022-02-20 LAB — PROTIME-INR
INR: 2.8 — ABNORMAL HIGH (ref 0.9–1.2)
Prothrombin Time: 28.3 s — ABNORMAL HIGH (ref 9.1–12.0)

## 2022-02-20 NOTE — Patient Instructions (Signed)
Description   Called and spoke to pt and instructed pt to continue to take warfarin 1.5 tablets daily except for 1 tablet on Sundays. Recheck INR in 3 weeks. Coumadin Clinic [phone]

## 2022-03-08 ENCOUNTER — Other Ambulatory Visit: Payer: Self-pay | Admitting: Cardiology

## 2022-03-13 ENCOUNTER — Other Ambulatory Visit: Payer: Self-pay | Admitting: Registered Nurse

## 2022-03-14 ENCOUNTER — Ambulatory Visit (INDEPENDENT_AMBULATORY_CARE_PROVIDER_SITE_OTHER): Payer: No Typology Code available for payment source | Admitting: Cardiovascular Disease

## 2022-03-14 DIAGNOSIS — Z952 Presence of prosthetic heart valve: Secondary | ICD-10-CM

## 2022-03-14 DIAGNOSIS — Z7901 Long term (current) use of anticoagulants: Secondary | ICD-10-CM | POA: Diagnosis not present

## 2022-03-14 LAB — SPECIMEN STATUS REPORT

## 2022-03-15 LAB — PROTIME-INR
INR: 3.5 — ABNORMAL HIGH (ref 0.9–1.2)
Prothrombin Time: 34.5 s — ABNORMAL HIGH (ref 9.1–12.0)

## 2022-03-15 NOTE — Progress Notes (Signed)
Appt made for recheck in 2 weeks, 03/26/22, per anticoag f/u request.

## 2022-03-15 NOTE — Progress Notes (Signed)
noted 

## 2022-03-25 ENCOUNTER — Other Ambulatory Visit: Payer: Self-pay | Admitting: Cardiovascular Disease

## 2022-03-26 ENCOUNTER — Ambulatory Visit: Payer: Self-pay | Admitting: *Deleted

## 2022-03-26 VITALS — BP 138/78 | HR 66 | Wt 127.0 lb

## 2022-03-26 DIAGNOSIS — Z952 Presence of prosthetic heart valve: Secondary | ICD-10-CM

## 2022-03-26 DIAGNOSIS — Z7901 Long term (current) use of anticoagulants: Secondary | ICD-10-CM

## 2022-03-27 ENCOUNTER — Ambulatory Visit (INDEPENDENT_AMBULATORY_CARE_PROVIDER_SITE_OTHER): Payer: No Typology Code available for payment source

## 2022-03-27 DIAGNOSIS — Z5181 Encounter for therapeutic drug level monitoring: Secondary | ICD-10-CM | POA: Diagnosis not present

## 2022-03-27 DIAGNOSIS — Z7901 Long term (current) use of anticoagulants: Secondary | ICD-10-CM

## 2022-03-27 DIAGNOSIS — Z952 Presence of prosthetic heart valve: Secondary | ICD-10-CM | POA: Diagnosis not present

## 2022-03-27 LAB — PROTIME-INR
INR: 3.3 — ABNORMAL HIGH (ref 0.9–1.2)
Prothrombin Time: 32.6 s — ABNORMAL HIGH (ref 9.1–12.0)

## 2022-03-27 NOTE — Progress Notes (Signed)
Reviewed RN Rolly Salter note; noted; discussed anticoagulation clinic instructions with patient in workcenter and she was excited she gets to eat salad once a week.  Denied further concerns or questions regarding dosing and she plans to hold dose this evening.  Patient verbalized understanding information/instructions and had no further questions at this time. ?

## 2022-03-27 NOTE — Progress Notes (Signed)
Spoke with pt by phone. Confirmed adjusted dosing from anticoag clinic. Appt made for recheck in 2 weeks, 04/09/22 at 11:30.

## 2022-03-27 NOTE — Patient Instructions (Signed)
Description   ?Called pt and instructed to HOLD TODAY ONLY and then resume Warfarin 1.5 tablets daily except for 1 tablet on Sundays.  ?Instructed to add 1 serving of greens to diet each week (Stay consistent)  ?Recheck INR in 2 weeks.  ?Coumadin Clinic [phone] ?  ?   ?

## 2022-04-09 ENCOUNTER — Other Ambulatory Visit: Payer: Self-pay | Admitting: Registered Nurse

## 2022-04-10 ENCOUNTER — Ambulatory Visit (INDEPENDENT_AMBULATORY_CARE_PROVIDER_SITE_OTHER): Payer: No Typology Code available for payment source

## 2022-04-10 DIAGNOSIS — Z7901 Long term (current) use of anticoagulants: Secondary | ICD-10-CM

## 2022-04-10 DIAGNOSIS — Z952 Presence of prosthetic heart valve: Secondary | ICD-10-CM

## 2022-04-10 LAB — PROTIME-INR
INR: 2.3 — ABNORMAL HIGH (ref 0.9–1.2)
Prothrombin Time: 23.1 s — ABNORMAL HIGH (ref 9.1–12.0)

## 2022-04-10 NOTE — Patient Instructions (Addendum)
Description   ?Called pt and instructed to continue Warfarin 1.5 tablets daily except for 1 tablet on Sundays.  ?Stay consistent with greens each week.  ?Recheck INR in 3 weeks at lab.  ?Coumadin Clinic [phone] ?  ?   ?

## 2022-04-18 ENCOUNTER — Encounter: Payer: Self-pay | Admitting: Registered Nurse

## 2022-04-18 ENCOUNTER — Other Ambulatory Visit: Payer: Self-pay | Admitting: Registered Nurse

## 2022-04-18 DIAGNOSIS — R7989 Other specified abnormal findings of blood chemistry: Secondary | ICD-10-CM

## 2022-04-18 NOTE — Telephone Encounter (Signed)
Patient needs vitamin D level after 12 doses cholecalciferol 50,000 units weekly po due May 2023.  Pharmacy notified Rx refused as needs appt/labs. ?

## 2022-04-30 ENCOUNTER — Ambulatory Visit: Payer: Self-pay | Admitting: *Deleted

## 2022-04-30 VITALS — BP 136/79 | HR 63

## 2022-04-30 DIAGNOSIS — Z7901 Long term (current) use of anticoagulants: Secondary | ICD-10-CM

## 2022-04-30 DIAGNOSIS — Z952 Presence of prosthetic heart valve: Secondary | ICD-10-CM

## 2022-04-30 NOTE — Progress Notes (Signed)
Able to collect INR tube but unsuccessful with remaining tubes for A1c, Vit D, HFP per NP orders for lab f/u. Pt opted to return another day to try again since non-fasting. ?

## 2022-05-01 ENCOUNTER — Other Ambulatory Visit: Payer: Self-pay | Admitting: *Deleted

## 2022-05-01 ENCOUNTER — Ambulatory Visit (INDEPENDENT_AMBULATORY_CARE_PROVIDER_SITE_OTHER): Payer: No Typology Code available for payment source

## 2022-05-01 DIAGNOSIS — Z7901 Long term (current) use of anticoagulants: Secondary | ICD-10-CM | POA: Diagnosis not present

## 2022-05-01 DIAGNOSIS — Z952 Presence of prosthetic heart valve: Secondary | ICD-10-CM

## 2022-05-01 DIAGNOSIS — R17 Unspecified jaundice: Secondary | ICD-10-CM

## 2022-05-01 DIAGNOSIS — R7401 Elevation of levels of liver transaminase levels: Secondary | ICD-10-CM

## 2022-05-01 DIAGNOSIS — R7402 Elevation of levels of lactic acid dehydrogenase (LDH): Secondary | ICD-10-CM

## 2022-05-01 DIAGNOSIS — R7303 Prediabetes: Secondary | ICD-10-CM

## 2022-05-01 DIAGNOSIS — R7989 Other specified abnormal findings of blood chemistry: Secondary | ICD-10-CM

## 2022-05-01 LAB — PROTIME-INR
INR: 3.5 — ABNORMAL HIGH (ref 0.9–1.2)
Prothrombin Time: 34.2 s — ABNORMAL HIGH (ref 9.1–12.0)

## 2022-05-01 NOTE — Patient Instructions (Signed)
Description   ?Called pt and instructed to hold today's dose and then continue Warfarin 1.5 tablets daily except for 1 tablet on Sundays.  ?Stay consistent with greens each week.  ?Recheck INR in 2 weeks at lab.  ?Coumadin Clinic [phone] ?  ?   ?

## 2022-05-01 NOTE — Progress Notes (Signed)
Appt made for 5/22 for repeat INR.

## 2022-05-01 NOTE — Progress Notes (Signed)
noted 

## 2022-05-02 LAB — HEPATIC FUNCTION PANEL
ALT: 24 IU/L (ref 0–32)
AST: 31 IU/L (ref 0–40)
Albumin: 4.7 g/dL (ref 3.8–4.9)
Alkaline Phosphatase: 96 IU/L (ref 44–121)
Bilirubin Total: 1 mg/dL (ref 0.0–1.2)
Bilirubin, Direct: 0.23 mg/dL (ref 0.00–0.40)
Total Protein: 7.2 g/dL (ref 6.0–8.5)

## 2022-05-02 LAB — VITAMIN D 25 HYDROXY (VIT D DEFICIENCY, FRACTURES): Vit D, 25-Hydroxy: 72.8 ng/mL (ref 30.0–100.0)

## 2022-05-02 LAB — HGB A1C W/O EAG: Hgb A1c MFr Bld: 5.7 % — ABNORMAL HIGH (ref 4.8–5.6)

## 2022-05-02 NOTE — Addendum Note (Signed)
Addended by: Albina Billet A on: 05/02/2022 09:28 AM ? ? Modules accepted: Orders ? ?

## 2022-05-03 NOTE — Progress Notes (Signed)
Reviewed RN Haley note agreed with plan of care. 

## 2022-05-03 NOTE — Progress Notes (Signed)
Results reviewed with pt via phone per NP Tina's notes. Emailed pt USP/ISP labeling to look for when selecting OTC Vit D daily supplement. Appt scheduled for A1c recheck in 3 months. Pt verbalizes understanding and agreement. Denies further questions or concern.

## 2022-05-14 ENCOUNTER — Ambulatory Visit: Payer: Self-pay | Admitting: *Deleted

## 2022-05-14 VITALS — BP 122/78 | HR 61

## 2022-05-14 DIAGNOSIS — Z7901 Long term (current) use of anticoagulants: Secondary | ICD-10-CM

## 2022-05-14 DIAGNOSIS — Z952 Presence of prosthetic heart valve: Secondary | ICD-10-CM

## 2022-05-15 ENCOUNTER — Ambulatory Visit (INDEPENDENT_AMBULATORY_CARE_PROVIDER_SITE_OTHER): Payer: No Typology Code available for payment source | Admitting: Internal Medicine

## 2022-05-15 DIAGNOSIS — Z7901 Long term (current) use of anticoagulants: Secondary | ICD-10-CM | POA: Diagnosis not present

## 2022-05-15 DIAGNOSIS — Z952 Presence of prosthetic heart valve: Secondary | ICD-10-CM

## 2022-05-15 LAB — PROTIME-INR
INR: 2.2 — ABNORMAL HIGH (ref 0.9–1.2)
Prothrombin Time: 22 s — ABNORMAL HIGH (ref 9.1–12.0)

## 2022-05-15 NOTE — Progress Notes (Signed)
Appt made for 4 week repeat per anticoag clinic.

## 2022-05-16 NOTE — Progress Notes (Signed)
Noted next INR/PT in 4 weeks per anticoagulation clinic

## 2022-06-05 ENCOUNTER — Other Ambulatory Visit: Payer: Self-pay | Admitting: Cardiovascular Disease

## 2022-06-11 ENCOUNTER — Ambulatory Visit: Payer: Self-pay | Admitting: *Deleted

## 2022-06-11 VITALS — BP 121/71 | HR 62

## 2022-06-11 DIAGNOSIS — Z952 Presence of prosthetic heart valve: Secondary | ICD-10-CM

## 2022-06-11 DIAGNOSIS — Z7901 Long term (current) use of anticoagulants: Secondary | ICD-10-CM

## 2022-06-12 ENCOUNTER — Ambulatory Visit (INDEPENDENT_AMBULATORY_CARE_PROVIDER_SITE_OTHER): Payer: No Typology Code available for payment source | Admitting: Cardiovascular Disease

## 2022-06-12 DIAGNOSIS — Z952 Presence of prosthetic heart valve: Secondary | ICD-10-CM

## 2022-06-12 DIAGNOSIS — Z7901 Long term (current) use of anticoagulants: Secondary | ICD-10-CM | POA: Diagnosis not present

## 2022-06-12 LAB — PROTIME-INR
INR: 3.7 — ABNORMAL HIGH (ref 0.9–1.2)
Prothrombin Time: 36.1 s — ABNORMAL HIGH (ref 9.1–12.0)

## 2022-06-12 NOTE — Progress Notes (Signed)
Reviewed RN Rolly Salter note patient scheduled for follow up PT/INR per anticoagulation clinic provider.  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  Patient had increased beer intake recently.  Patient had no further questions or concerns at that time.   Denied nosebleeds/bruising/blood in urine/stool.

## 2022-06-12 NOTE — Progress Notes (Signed)
Appt made for 4 week recheck, 07/10/22.

## 2022-06-24 ENCOUNTER — Other Ambulatory Visit: Payer: Self-pay | Admitting: Cardiovascular Disease

## 2022-06-24 DIAGNOSIS — Z7901 Long term (current) use of anticoagulants: Secondary | ICD-10-CM

## 2022-07-10 ENCOUNTER — Other Ambulatory Visit: Payer: Self-pay | Admitting: Registered Nurse

## 2022-07-10 ENCOUNTER — Telehealth: Payer: Self-pay

## 2022-07-10 NOTE — Telephone Encounter (Signed)
I spoke to the patient and she said labs (INR) were drawn earlier today 7/18.

## 2022-07-11 ENCOUNTER — Telehealth: Payer: Self-pay | Admitting: Registered Nurse

## 2022-07-11 ENCOUNTER — Ambulatory Visit (INDEPENDENT_AMBULATORY_CARE_PROVIDER_SITE_OTHER): Payer: No Typology Code available for payment source

## 2022-07-11 ENCOUNTER — Encounter: Payer: Self-pay | Admitting: Registered Nurse

## 2022-07-11 DIAGNOSIS — Z7901 Long term (current) use of anticoagulants: Secondary | ICD-10-CM | POA: Diagnosis not present

## 2022-07-11 DIAGNOSIS — Z952 Presence of prosthetic heart valve: Secondary | ICD-10-CM

## 2022-07-11 LAB — PROTIME-INR
INR: 3.7 — ABNORMAL HIGH (ref 0.9–1.2)
Prothrombin Time: 36.1 s — ABNORMAL HIGH (ref 9.1–12.0)

## 2022-07-11 NOTE — Patient Instructions (Signed)
Description   Only take 1 tablet today and then START taking Warfarin 1.5 tablets daily except for 1 tablet on Sundays and Thursdays.  Stay consistent with greens each week.  Recheck INR in 2 weeks at lab.  Coumadin Clinic [phone]

## 2022-07-11 NOTE — Telephone Encounter (Signed)
Results reviewed in [facility] portal have not crossed over to epic at this time.  Results sent to anticoagulation clinic staff via epic.  My chart message sent to patient.  Victoria [name],  your INR and PT elevated above goal 2-3.  Follow up with anticoagulation clinic for anticipated dosing changes and when to have next blood draw.  Coumadin Clinic [phone]  Sincerely, Albina Billet NP-C  INR  3.7                Reference interval is for non-anticoagulated patients.                                                                     .                Suggested INR therapeutic range for Vitamin K  antagonist therapy:                 Standard Dose (moderate intensity therapeutic range):       2.0 - 3.0                   Higher intensity therapeutic range       2.5 - 3.5       High  0.9 - 1.2   Prothrombin Time  36.1 High sec 9.1 - 12.0

## 2022-07-11 NOTE — Telephone Encounter (Signed)
Please refer to anticoagulation encounter.

## 2022-07-24 ENCOUNTER — Telehealth: Payer: Self-pay

## 2022-07-24 ENCOUNTER — Other Ambulatory Visit: Payer: Self-pay | Admitting: Registered Nurse

## 2022-07-24 NOTE — Telephone Encounter (Signed)
Reminded patient to have PT/INR lab drawn.  Verbalized understanding

## 2022-07-25 ENCOUNTER — Encounter: Payer: Self-pay | Admitting: Registered Nurse

## 2022-07-25 ENCOUNTER — Ambulatory Visit (INDEPENDENT_AMBULATORY_CARE_PROVIDER_SITE_OTHER): Payer: No Typology Code available for payment source

## 2022-07-25 ENCOUNTER — Telehealth: Payer: Self-pay | Admitting: Registered Nurse

## 2022-07-25 DIAGNOSIS — Z7901 Long term (current) use of anticoagulants: Secondary | ICD-10-CM | POA: Diagnosis not present

## 2022-07-25 DIAGNOSIS — Z952 Presence of prosthetic heart valve: Secondary | ICD-10-CM | POA: Diagnosis not present

## 2022-07-25 LAB — PROTIME-INR
INR: 2 — ABNORMAL HIGH (ref 0.9–1.2)
Prothrombin Time: 20.7 s — ABNORMAL HIGH (ref 9.1–12.0)

## 2022-07-25 NOTE — Telephone Encounter (Signed)
Refer to anticoagulation encounter

## 2022-07-25 NOTE — Patient Instructions (Signed)
Description   Take 2 tablets today and then continue taking Warfarin 1.5 tablets daily except for 1 tablet on Sundays and Thursdays.  Stay consistent with greens each week.  Recheck INR in 2 weeks at lab.  Coumadin Clinic [phone]

## 2022-07-25 NOTE — Telephone Encounter (Signed)
Results reviewed in [facility] portal has not yet crossed over to Epic  Copy sent to anticoagulation clinic staff.  INR 2.0 in range but patient to follow up with anticoagulation clinic for instructions next labs and if any dosing changes.  Epic reviewed patient goal 2-3 per anticoagulation clinic staff.  My chart message sent to patient.   INR  2.0                Reference interval is for non-anticoagulated patients.                                                                     .                Suggested INR therapeutic range for Vitamin K                antagonist therapy:                   Standard Dose (moderate intensity                                  therapeutic range):       2.0 - 3.0                   Higher intensity therapeutic range       2.5 - 3.5 High  0.9 - 1.2 Prothrombin Time  20.7 High sec 9.1 - 12.0

## 2022-08-06 ENCOUNTER — Other Ambulatory Visit: Payer: Self-pay | Admitting: Registered Nurse

## 2022-08-06 ENCOUNTER — Other Ambulatory Visit: Payer: Self-pay

## 2022-08-06 DIAGNOSIS — R7303 Prediabetes: Secondary | ICD-10-CM

## 2022-08-07 ENCOUNTER — Ambulatory Visit (INDEPENDENT_AMBULATORY_CARE_PROVIDER_SITE_OTHER): Payer: No Typology Code available for payment source | Admitting: Internal Medicine

## 2022-08-07 ENCOUNTER — Telehealth: Payer: Self-pay

## 2022-08-07 DIAGNOSIS — Z952 Presence of prosthetic heart valve: Secondary | ICD-10-CM

## 2022-08-07 DIAGNOSIS — Z7901 Long term (current) use of anticoagulants: Secondary | ICD-10-CM | POA: Diagnosis not present

## 2022-08-07 LAB — HGB A1C W/O EAG: Hgb A1c MFr Bld: 5.9 % — ABNORMAL HIGH (ref 4.8–5.6)

## 2022-08-07 LAB — PROTIME-INR
INR: 4.5 — ABNORMAL HIGH (ref 0.9–1.2)
Prothrombin Time: 43.4 s — ABNORMAL HIGH (ref 9.1–12.0)

## 2022-08-07 NOTE — Telephone Encounter (Signed)
Lpm to have INR drawn.

## 2022-08-07 NOTE — Addendum Note (Signed)
Addended by: Albina Billet A on: 08/07/2022 12:38 PM   Modules accepted: Orders, Level of Service

## 2022-08-14 ENCOUNTER — Other Ambulatory Visit: Payer: Self-pay | Admitting: Registered Nurse

## 2022-08-15 ENCOUNTER — Ambulatory Visit (INDEPENDENT_AMBULATORY_CARE_PROVIDER_SITE_OTHER): Payer: No Typology Code available for payment source

## 2022-08-15 DIAGNOSIS — Z7901 Long term (current) use of anticoagulants: Secondary | ICD-10-CM

## 2022-08-15 DIAGNOSIS — Z952 Presence of prosthetic heart valve: Secondary | ICD-10-CM

## 2022-08-15 LAB — PROTIME-INR
INR: 1.9 — ABNORMAL HIGH (ref 0.9–1.2)
Prothrombin Time: 19.9 s — ABNORMAL HIGH (ref 9.1–12.0)

## 2022-08-15 NOTE — Patient Instructions (Signed)
Description   Take 2 tablets today and then continue taking Warfarin 1.5 tablets daily except for 1 tablet on Sundays and Thursdays.  Stay consistent with greens each week.  Recheck INR in 2 weeks at lab.  Coumadin Clinic [phone]

## 2022-08-28 ENCOUNTER — Telehealth: Payer: Self-pay

## 2022-08-28 ENCOUNTER — Other Ambulatory Visit: Payer: No Typology Code available for payment source

## 2022-08-28 DIAGNOSIS — Z952 Presence of prosthetic heart valve: Secondary | ICD-10-CM

## 2022-08-28 DIAGNOSIS — Z7901 Long term (current) use of anticoagulants: Secondary | ICD-10-CM

## 2022-08-28 NOTE — Patient Instructions (Signed)
Labs drawn from L antecubital without difficulty.  Patient tolerated well.  2x2 and coflex applied.  Pt instructed to removed after approximately 20 minutes.

## 2022-08-28 NOTE — Telephone Encounter (Signed)
Lpm to have INR drawn today. 

## 2022-08-30 ENCOUNTER — Other Ambulatory Visit: Payer: No Typology Code available for payment source

## 2022-08-30 ENCOUNTER — Telehealth: Payer: Self-pay

## 2022-08-30 DIAGNOSIS — Z7901 Long term (current) use of anticoagulants: Secondary | ICD-10-CM

## 2022-08-30 NOTE — Patient Instructions (Signed)
Lab drawn from R antecubital with 23g butterfly without difficulty.  Patient tolerated well.  2x2 and coflex applied.  Patient instructed to remove after 15-20 minutes and replace with bandaid.  Pt verbalized understanding.

## 2022-08-30 NOTE — Telephone Encounter (Signed)
I spoke to patient and [facility] was not able to get INR result in a timely manner from when it was drawn.  The patient will go for redraw.

## 2022-08-31 ENCOUNTER — Ambulatory Visit (INDEPENDENT_AMBULATORY_CARE_PROVIDER_SITE_OTHER): Payer: No Typology Code available for payment source

## 2022-08-31 DIAGNOSIS — Z952 Presence of prosthetic heart valve: Secondary | ICD-10-CM

## 2022-08-31 DIAGNOSIS — Z7901 Long term (current) use of anticoagulants: Secondary | ICD-10-CM | POA: Diagnosis not present

## 2022-08-31 LAB — PROTIME-INR
INR: 2.4 — ABNORMAL HIGH (ref 0.9–1.2)
Prothrombin Time: 24.8 s — ABNORMAL HIGH (ref 9.1–12.0)

## 2022-08-31 NOTE — Patient Instructions (Signed)
Description   Called and spoke with pt. Instructed to continue taking Warfarin 1.5 tablets daily except for 1 tablet on Sundays and Thursdays.  Stay consistent with greens each week.  Recheck INR in 3 weeks at lab.  Coumadin Clinic [phone]

## 2022-09-06 LAB — PROTIME-INR

## 2022-09-21 ENCOUNTER — Other Ambulatory Visit: Payer: No Typology Code available for payment source

## 2022-09-21 ENCOUNTER — Other Ambulatory Visit: Payer: Self-pay | Admitting: Cardiovascular Disease

## 2022-09-21 DIAGNOSIS — Z7901 Long term (current) use of anticoagulants: Secondary | ICD-10-CM

## 2022-09-24 ENCOUNTER — Other Ambulatory Visit: Payer: No Typology Code available for payment source

## 2022-09-24 DIAGNOSIS — Z7901 Long term (current) use of anticoagulants: Secondary | ICD-10-CM

## 2022-09-24 NOTE — Progress Notes (Signed)
Labs drawn with 23g butterfly from left antecubital without difficulty.  2x2 and coflex applied.  Pt instructed to leave in place for 15-20 minutes then replace with bandaid given to patient.  Patient verbalized understanding.

## 2022-09-25 ENCOUNTER — Ambulatory Visit (INDEPENDENT_AMBULATORY_CARE_PROVIDER_SITE_OTHER): Payer: No Typology Code available for payment source | Admitting: *Deleted

## 2022-09-25 DIAGNOSIS — Z952 Presence of prosthetic heart valve: Secondary | ICD-10-CM

## 2022-09-25 DIAGNOSIS — Z7901 Long term (current) use of anticoagulants: Secondary | ICD-10-CM

## 2022-09-25 LAB — PROTIME-INR
INR: 2.5 — ABNORMAL HIGH (ref 0.9–1.2)
Prothrombin Time: 25.6 s — ABNORMAL HIGH (ref 9.1–12.0)

## 2022-10-22 ENCOUNTER — Other Ambulatory Visit: Payer: No Typology Code available for payment source

## 2022-10-22 DIAGNOSIS — Z7901 Long term (current) use of anticoagulants: Secondary | ICD-10-CM

## 2022-10-23 ENCOUNTER — Ambulatory Visit (INDEPENDENT_AMBULATORY_CARE_PROVIDER_SITE_OTHER): Payer: No Typology Code available for payment source | Admitting: *Deleted

## 2022-10-23 DIAGNOSIS — Z7901 Long term (current) use of anticoagulants: Secondary | ICD-10-CM

## 2022-10-23 DIAGNOSIS — Z952 Presence of prosthetic heart valve: Secondary | ICD-10-CM | POA: Diagnosis not present

## 2022-10-23 LAB — PROTIME-INR
INR: 3.3 — ABNORMAL HIGH (ref 0.9–1.2)
Prothrombin Time: 32.9 s — ABNORMAL HIGH (ref 9.1–12.0)

## 2022-11-12 ENCOUNTER — Other Ambulatory Visit: Payer: No Typology Code available for payment source | Admitting: Occupational Medicine

## 2022-11-12 VITALS — BP 129/73 | HR 55

## 2022-11-12 DIAGNOSIS — Z952 Presence of prosthetic heart valve: Secondary | ICD-10-CM

## 2022-11-12 NOTE — Progress Notes (Signed)
Lab drawn from Left AC TOLERATED WELL.

## 2022-11-13 ENCOUNTER — Ambulatory Visit (INDEPENDENT_AMBULATORY_CARE_PROVIDER_SITE_OTHER): Payer: No Typology Code available for payment source | Admitting: *Deleted

## 2022-11-13 DIAGNOSIS — Z7901 Long term (current) use of anticoagulants: Secondary | ICD-10-CM | POA: Diagnosis not present

## 2022-11-13 DIAGNOSIS — Z952 Presence of prosthetic heart valve: Secondary | ICD-10-CM | POA: Diagnosis not present

## 2022-11-13 LAB — PROTIME-INR
INR: 2 — ABNORMAL HIGH (ref 0.9–1.2)
Prothrombin Time: 20.5 s — ABNORMAL HIGH (ref 9.1–12.0)

## 2022-11-13 NOTE — Patient Instructions (Signed)
Description   Called and spoke with pt and instructed to take 2 tablets (10mg ) today then continue taking Warfarin 1.5 tablets daily except for 1 tablet on Monday and Thursdays.  Stay consistent with greens each week. Recheck INR in 4 weeks at lab. [facility] [phone]

## 2022-11-14 ENCOUNTER — Other Ambulatory Visit: Payer: Self-pay | Admitting: Occupational Medicine

## 2022-11-14 DIAGNOSIS — Z7901 Long term (current) use of anticoagulants: Secondary | ICD-10-CM

## 2022-11-14 DIAGNOSIS — Z952 Presence of prosthetic heart valve: Secondary | ICD-10-CM

## 2022-12-10 ENCOUNTER — Other Ambulatory Visit: Payer: No Typology Code available for payment source | Admitting: Occupational Medicine

## 2022-12-10 DIAGNOSIS — R7303 Prediabetes: Secondary | ICD-10-CM

## 2022-12-10 DIAGNOSIS — Z7901 Long term (current) use of anticoagulants: Secondary | ICD-10-CM

## 2022-12-10 DIAGNOSIS — R7989 Other specified abnormal findings of blood chemistry: Secondary | ICD-10-CM

## 2022-12-10 DIAGNOSIS — Z952 Presence of prosthetic heart valve: Secondary | ICD-10-CM

## 2022-12-10 NOTE — Progress Notes (Signed)
Lab drawn from Left AC tolerated well no issues noted.   

## 2022-12-11 ENCOUNTER — Ambulatory Visit (INDEPENDENT_AMBULATORY_CARE_PROVIDER_SITE_OTHER): Payer: No Typology Code available for payment source | Admitting: Cardiology

## 2022-12-11 ENCOUNTER — Other Ambulatory Visit: Payer: Self-pay | Admitting: Registered Nurse

## 2022-12-11 DIAGNOSIS — Z7901 Long term (current) use of anticoagulants: Secondary | ICD-10-CM

## 2022-12-11 DIAGNOSIS — R7303 Prediabetes: Secondary | ICD-10-CM

## 2022-12-11 DIAGNOSIS — Z5181 Encounter for therapeutic drug level monitoring: Secondary | ICD-10-CM

## 2022-12-11 DIAGNOSIS — Z952 Presence of prosthetic heart valve: Secondary | ICD-10-CM

## 2022-12-11 DIAGNOSIS — R7989 Other specified abnormal findings of blood chemistry: Secondary | ICD-10-CM

## 2022-12-11 LAB — VITAMIN D 25 HYDROXY (VIT D DEFICIENCY, FRACTURES): Vit D, 25-Hydroxy: 24.7 ng/mL — ABNORMAL LOW (ref 30.0–100.0)

## 2022-12-11 LAB — PROTIME-INR
INR: 4.1 — ABNORMAL HIGH (ref 0.9–1.2)
Prothrombin Time: 39.8 s — ABNORMAL HIGH (ref 9.1–12.0)

## 2022-12-11 LAB — HGB A1C W/O EAG

## 2022-12-11 MED ORDER — CHOLECALCIFEROL 1.25 MG (50000 UT) PO TABS: 1.0000 | ORAL_TABLET | ORAL | 0 refills | Status: AC

## 2022-12-15 NOTE — Progress Notes (Signed)
Patient seen in [facility] 13 Dec 2022 stated she spoke with anticoagulation clinic and next PT/INR to be drawn in 4 weeks.  She has not yet scheduled with RN Kimrey  Patient denied further questions or concerns.

## 2022-12-24 ENCOUNTER — Other Ambulatory Visit: Payer: Self-pay | Admitting: Cardiovascular Disease

## 2022-12-24 DIAGNOSIS — Z7901 Long term (current) use of anticoagulants: Secondary | ICD-10-CM

## 2023-01-07 ENCOUNTER — Other Ambulatory Visit: Payer: No Typology Code available for payment source | Admitting: Occupational Medicine

## 2023-01-07 VITALS — BP 132/82

## 2023-01-07 DIAGNOSIS — Z7901 Long term (current) use of anticoagulants: Secondary | ICD-10-CM

## 2023-01-07 DIAGNOSIS — R7303 Prediabetes: Secondary | ICD-10-CM

## 2023-01-07 NOTE — Progress Notes (Signed)
Lab drawn from Left AC tolerated well no issues noted.   

## 2023-01-08 ENCOUNTER — Ambulatory Visit (INDEPENDENT_AMBULATORY_CARE_PROVIDER_SITE_OTHER): Payer: No Typology Code available for payment source | Admitting: Cardiology

## 2023-01-08 ENCOUNTER — Other Ambulatory Visit: Payer: Self-pay | Admitting: Registered Nurse

## 2023-01-08 ENCOUNTER — Encounter: Payer: Self-pay | Admitting: Registered Nurse

## 2023-01-08 DIAGNOSIS — Z5181 Encounter for therapeutic drug level monitoring: Secondary | ICD-10-CM | POA: Diagnosis not present

## 2023-01-08 DIAGNOSIS — Z952 Presence of prosthetic heart valve: Secondary | ICD-10-CM

## 2023-01-08 DIAGNOSIS — R7303 Prediabetes: Secondary | ICD-10-CM

## 2023-01-08 DIAGNOSIS — Z7901 Long term (current) use of anticoagulants: Secondary | ICD-10-CM | POA: Diagnosis not present

## 2023-01-08 LAB — PROTIME-INR
INR: 2.1 — ABNORMAL HIGH (ref 0.9–1.2)
Prothrombin Time: 21.9 s — ABNORMAL HIGH (ref 9.1–12.0)

## 2023-01-08 LAB — HGB A1C W/O EAG: Hgb A1c MFr Bld: 6 % — ABNORMAL HIGH (ref 4.8–5.6)

## 2023-01-09 NOTE — Progress Notes (Signed)
noted 

## 2023-01-29 ENCOUNTER — Ambulatory Visit: Payer: No Typology Code available for payment source | Admitting: Registered Nurse

## 2023-01-29 ENCOUNTER — Encounter: Payer: Self-pay | Admitting: Registered Nurse

## 2023-01-29 DIAGNOSIS — Z6379 Other stressful life events affecting family and household: Secondary | ICD-10-CM

## 2023-01-29 NOTE — Patient Instructions (Signed)
Managing Loss, Adult People experience loss in many different ways throughout their lives. Events such as moving, changing jobs, and losing friends can create a sense of loss. The loss may be as serious as a major health change, divorce, death of a pet, or death of a loved one. All of these types of loss are likely to create a physical and emotional reaction known as grief. Grief is the result of a major change or an absence of something or someone that you count on. Grief is a normal reaction to loss. A variety of factors can affect your grieving experience, including: The nature of your loss. Your relationship to what or whom you lost. Your understanding of grief and how to manage it. Your support system. Be aware that when grief becomes extreme, it can lead to more severe issues like isolation, depression, anxiety, or suicidal thoughts. Talk with your health care provider if you have any of these issues. How to manage lifestyle changes Keep to your normal routine as much as possible. If you have trouble focusing or doing normal activities, it is acceptable to take some time away from your normal routine. Spend time with friends and loved ones. Eat a healthy diet, get plenty of sleep, and rest when you feel tired. How to recognize changes  The way that you deal with your grief will affect your ability to function as you normally do. When grieving, you may experience these changes: Numbness, shock, sadness, anxiety, anger, denial, and guilt. Thoughts about death. Unexpected crying. A physical sensation of emptiness in your stomach. Problems sleeping and eating. Tiredness (fatigue). Loss of interest in normal activities. Dreaming about or imagining seeing the person who died. A need to remember what or whom you lost. Difficulty thinking about anything other than your loss for a period of time. Relief. If you have been expecting the loss for a while, you may feel a sense of relief when it  happens. Follow these instructions at home: Activity Express your feelings in healthy ways, such as: Talking with others about your loss. It may be helpful to find others who have had a similar loss, such as a support group. Writing down your feelings in a journal. Doing physical activities to release stress and emotional energy. Doing creative activities like painting, sculpting, or playing or listening to music. Practicing resilience. This is the ability to recover and adjust after facing challenges. Reading some resources that encourage resilience may help you to learn ways to practice those behaviors.  General instructions Be patient with yourself and others. Allow the grieving process to happen, and remember that grieving takes time. It is likely that you may never feel completely done with some grief. You may find a way to move on while still cherishing memories and feelings about your loss. Accepting your loss is a process. It can take months or longer to adjust. Keep all follow-up visits. This is important. Where to find support To get support for managing loss: Ask your health care provider for help and recommendations, such as grief counseling or therapy. Think about joining a support group for people who are managing a loss. Where to find more information You can find more information about managing loss from: American Society of Clinical Oncology: www.cancer.net American Psychological Association: www.apa.org Contact a health care provider if: Your grief is extreme and keeps getting worse. You have ongoing grief that does not improve. Your body shows symptoms of grief, such as illness. You feel depressed, anxious, or   hopeless. Get help right away if: You have thoughts about hurting yourself or others. Get help right away if you feel like you may hurt yourself or others, or have thoughts about taking your own life. Go to your nearest emergency room or: Call 911. Call the  [facility] at ([phone] or 988. This is open 24 hours a day. Text the Crisis Text Line at 343-286-9405. Summary Grief is the result of a major change or an absence of someone or something that you count on. Grief is a normal reaction to loss. The depth of grief and the period of recovery depend on the type of loss and your ability to adjust to the change and process your feelings. Processing grief requires patience and a willingness to accept your feelings and talk about your loss with people who are supportive. It is important to find resources that work for you and to realize that people experience grief differently. There is not one grieving process that works for everyone in the same way. Be aware that when grief becomes extreme, it can lead to more severe issues like isolation, depression, anxiety, or suicidal thoughts. Talk with your health care provider if you have any of these issues. This information is not intended to replace advice given to you by your health care provider. Make sure you discuss any questions you have with your health care provider. Document Revised: 07/31/2021 Document Reviewed: 07/31/2021 Elsevier Patient Education  [employer].  See resources sheet from [employer] regarding teledoc/EAP at work also 24/7 options available

## 2023-01-29 NOTE — Progress Notes (Signed)
Subjective:    Patient ID: Victoria [name], female    DOB: 09/22/1961, 62 y.o.   MRN: [id number]  61y/o established caucasian female here to discuss starting medication for sadness.  Mother was diagnosed with pancreatic cancer Fall 2023.  Completed chemotherapy and radiation treatments prior to holidays failed.  Put into hospice yesterday.  Not established with teledoc or EAP or personal counselor at this time.  Denied HI/SI.      Review of Systems  Constitutional:  Positive for fatigue. Negative for chills and fever.  Psychiatric/Behavioral:  Positive for dysphoric mood. Negative for self-injury and suicidal ideas. The patient is nervous/anxious.        Objective:   Physical Exam Vitals and nursing note reviewed.  Constitutional:      General: She is awake. She is not in acute distress.    Appearance: Normal appearance. She is well-developed, well-groomed and normal weight. She is not ill-appearing, toxic-appearing or diaphoretic.  HENT:     Head: Normocephalic and atraumatic.     Jaw: There is normal jaw occlusion.     Salivary Glands: Right salivary gland is not diffusely enlarged. Left salivary gland is not diffusely enlarged.     Right Ear: Hearing and external ear normal.     Left Ear: Hearing and external ear normal.     Nose: Nose normal. No congestion or rhinorrhea.     Mouth/Throat:     Lips: Pink. No lesions.     Mouth: Mucous membranes are moist.     Pharynx: Oropharynx is clear.  Eyes:     General: Lids are normal. Vision grossly intact. Gaze aligned appropriately. No scleral icterus.       Right eye: No discharge.        Left eye: No discharge.     Extraocular Movements: Extraocular movements intact.     Conjunctiva/sclera: Conjunctivae normal.     Pupils: Pupils are equal, round, and reactive to light.  Neck:     Trachea: Trachea normal.  Cardiovascular:     Rate and Rhythm: Normal rate and regular rhythm.     Pulses: Normal pulses.  Pulmonary:      Effort: Pulmonary effort is normal.     Breath sounds: Normal breath sounds and air entry. No stridor or transmitted upper airway sounds. No wheezing.     Comments: Spoke full sentences without difficulty; no cough observed in exam room Abdominal:     General: Abdomen is flat.  Musculoskeletal:        General: Normal range of motion.     Cervical back: Normal range of motion and neck supple. No rigidity.     Right lower leg: No edema.     Left lower leg: No edema.  Lymphadenopathy:     Head:     Right side of head: No submandibular or preauricular adenopathy.     Left side of head: No submandibular or preauricular adenopathy.     Cervical: No cervical adenopathy.     Right cervical: No superficial cervical adenopathy.    Left cervical: No superficial cervical adenopathy.  Skin:    General: Skin is warm and dry.     Capillary Refill: Capillary refill takes less than 2 seconds.     Coloration: Skin is not ashen, cyanotic, jaundiced, mottled, pale or sallow.     Findings: No abrasion, bruising, burn, erythema, signs of injury, laceration, petechiae, rash or wound.  Neurological:     General: No focal deficit present.  Mental Status: She is alert and oriented to person, place, and time. Mental status is at baseline.     Motor: Motor function is intact. No weakness, tremor, abnormal muscle tone or seizure activity.     Coordination: Coordination is intact. Coordination normal.     Gait: Gait is intact. Gait normal.     Comments: In/out of chair without difficulty; gait sure and steady in clinic; bilateral hand grasp equal 5/5  Psychiatric:        Attention and Perception: Attention and perception normal.        Mood and Affect: Mood is anxious. Affect is tearful.        Speech: Speech normal.        Behavior: Behavior normal. Behavior is cooperative.        Thought Content: Thought content normal.        Cognition and Memory: Cognition and memory normal.        Judgment: Judgment  normal.           Assessment & Plan:  A-stress due to illness of family member  P-Given teledoc (free no copay) information and RN Kimrey to assist patient with app set up on phone.  EAP contact information/handout given to patient.  Discussed I am unable to prescribed controlled substances/mental health medications in this clinic due to contract limitations.  Will assist patient to establish with provider who can provider counseling and Rx if needed.  Consider support group children of parents with cancer. She has contact information for mother's social worker and left message.  Discussed with patient important for her to establish safe space she can speak with someone and that this situation is very stressful/anticipation loss of parent in near future.  ER if HI/SI need for immediate assistance/can also call national helpline 811.  Patient does want to schedule counseling appt and discuss initiation of medication with provider.  Patient verbalized understanding information/instructions, agreed with plan of care and had no further questions at this time.

## 2023-01-31 ENCOUNTER — Telehealth: Payer: Self-pay | Admitting: Occupational Medicine

## 2023-01-31 NOTE — Telephone Encounter (Signed)
Patient not here today called to update with the progress of [facility]ela doc setup . HR said tele doc has account already gave username. Emailed patient username and instructions to reset password to be able to access tel doc to setup appt with psych MD to get medication to help with increased sadness anxiety due to mothers medical issues.

## 2023-02-04 ENCOUNTER — Other Ambulatory Visit: Payer: No Typology Code available for payment source | Admitting: Occupational Medicine

## 2023-02-04 VITALS — BP 127/78 | HR 57

## 2023-02-04 DIAGNOSIS — Z7901 Long term (current) use of anticoagulants: Secondary | ICD-10-CM

## 2023-02-04 NOTE — Progress Notes (Signed)
Lab drawn from Left AC tolerated well no issues noted.

## 2023-02-05 ENCOUNTER — Ambulatory Visit (INDEPENDENT_AMBULATORY_CARE_PROVIDER_SITE_OTHER): Payer: No Typology Code available for payment source | Admitting: Cardiology

## 2023-02-05 DIAGNOSIS — Z5181 Encounter for therapeutic drug level monitoring: Secondary | ICD-10-CM | POA: Diagnosis not present

## 2023-02-05 LAB — PROTIME-INR
INR: 2.4 — ABNORMAL HIGH (ref 0.9–1.2)
Prothrombin Time: 24.9 s — ABNORMAL HIGH (ref 9.1–12.0)

## 2023-02-05 NOTE — Patient Instructions (Signed)
Description   Called and spoke with pt and instructed her to continue taking Warfarin 1.5 tablets daily except for 1 tablet on Monday and Thursdays.  Stay consistent with greens each week. Recheck INR in 5 weeks at lab. [facility] [phone]

## 2023-02-05 NOTE — Telephone Encounter (Signed)
Patient given username for teladoc yesterday tried to log in no success and tried to call no answer on the [employer] number. Also tried forgot password to get email sent to her. Today followed up never got email to reset password. Patient reports didn't call them back yet. Asked if she needed my assistance. Patient declined. Setup next lab appt in March. Patient knows about [employer] to call them and EAP counselor Dorisa if needs a session.

## 2023-03-04 ENCOUNTER — Other Ambulatory Visit: Payer: Self-pay | Admitting: Cardiology

## 2023-03-06 NOTE — Progress Notes (Signed)
Noted Hgba1c redrawn with next PT/INR appt

## 2023-03-06 NOTE — Progress Notes (Signed)
noted 

## 2023-03-08 ENCOUNTER — Other Ambulatory Visit: Payer: Self-pay | Admitting: Registered Nurse

## 2023-03-08 DIAGNOSIS — Z8639 Personal history of other endocrine, nutritional and metabolic disease: Secondary | ICD-10-CM

## 2023-03-11 ENCOUNTER — Other Ambulatory Visit: Payer: No Typology Code available for payment source | Admitting: Occupational Medicine

## 2023-03-11 DIAGNOSIS — Z7901 Long term (current) use of anticoagulants: Secondary | ICD-10-CM

## 2023-03-11 DIAGNOSIS — R7303 Prediabetes: Secondary | ICD-10-CM

## 2023-03-11 DIAGNOSIS — R7989 Other specified abnormal findings of blood chemistry: Secondary | ICD-10-CM

## 2023-03-11 NOTE — Progress Notes (Signed)
Lab drawn from Left AC tolerated well no issues noted.   

## 2023-03-12 ENCOUNTER — Encounter: Payer: Self-pay | Admitting: Registered Nurse

## 2023-03-12 ENCOUNTER — Ambulatory Visit (INDEPENDENT_AMBULATORY_CARE_PROVIDER_SITE_OTHER): Payer: No Typology Code available for payment source | Admitting: *Deleted

## 2023-03-12 DIAGNOSIS — Z952 Presence of prosthetic heart valve: Secondary | ICD-10-CM | POA: Diagnosis not present

## 2023-03-12 DIAGNOSIS — Z7901 Long term (current) use of anticoagulants: Secondary | ICD-10-CM

## 2023-03-12 LAB — VITAMIN D 25 HYDROXY (VIT D DEFICIENCY, FRACTURES): Vit D, 25-Hydroxy: 56.4 ng/mL (ref 30.0–100.0)

## 2023-03-12 LAB — PROTIME-INR
INR: 3.6 — ABNORMAL HIGH (ref 0.9–1.2)
Prothrombin Time: 35.3 s — ABNORMAL HIGH (ref 9.1–12.0)

## 2023-03-12 LAB — HGB A1C W/O EAG: Hgb A1c MFr Bld: 6.1 % — ABNORMAL HIGH (ref 4.8–5.6)

## 2023-03-12 MED ORDER — VITAMIN D3 50 MCG (2000 UT) PO CAPS: 2000.0000 [IU] | ORAL_CAPSULE | Freq: Every day | ORAL | 5 refills | Status: AC

## 2023-03-12 NOTE — Telephone Encounter (Unsigned)
My chart message sent to patient Normalee, Your vitamin D level was normal will switch to low daily dose to sustain your level over the summer 2000 units daily with meal.  In fall (Oct) restart the 50,000 units once a week with meal Rx was sent to your pharmacy for both.  Recheck level in 1 year.  Please let us know if you have further questions. Sincerely, Kimber Relic NP-C

## 2023-03-15 ENCOUNTER — Ambulatory Visit: Payer: No Typology Code available for payment source | Attending: Cardiology | Admitting: Cardiology

## 2023-03-15 ENCOUNTER — Encounter: Payer: Self-pay | Admitting: Cardiology

## 2023-03-15 VITALS — BP 126/84 | HR 64 | Ht 60.5 in | Wt 122.2 lb

## 2023-03-15 DIAGNOSIS — E785 Hyperlipidemia, unspecified: Secondary | ICD-10-CM

## 2023-03-15 DIAGNOSIS — Z952 Presence of prosthetic heart valve: Secondary | ICD-10-CM

## 2023-03-15 DIAGNOSIS — Z7901 Long term (current) use of anticoagulants: Secondary | ICD-10-CM

## 2023-03-15 DIAGNOSIS — I700 Atherosclerosis of aorta: Secondary | ICD-10-CM

## 2023-03-15 DIAGNOSIS — I10 Essential (primary) hypertension: Secondary | ICD-10-CM | POA: Diagnosis not present

## 2023-03-15 NOTE — Progress Notes (Signed)
Primary Care Provider: Jearld Fenton, NP [place] Cardiologist: Glenetta Hew, MD Electrophysiologist: None  Clinic Note: Chief Complaint  Patient presents with   Follow-up    Annual follow-up   Cardiac Valve Problem    Status post AVR.  Doing well.   ===================================  ASSESSMENT/PLAN   Problem List Items Addressed This Visit       Cardiology Problems   Hyperlipidemia with target LDL less than 100 (Chronic)   Essential hypertension (Chronic)    BP actually looks pretty well-controlled today she is on carvedilol 3.125 mg twice daily and actually only taking 5 mg amlodipine daily.  She has been on 10.      Relevant Orders   EKG 12-Lead (Completed)   Aortic atherosclerosis (Chronic)    Not unexpectedly aortic atherosclerosis noted on CT scans aortic root replacement likely exacerbated by aortic valve disease.  She is on high-dose statin and amlodipine for blood pressure.  Had previously been on higher dose but is now on lower dose carvedilol.        Other   S/P AVR - Primary (Chronic)    Doing well post Bentall.  She is now 13 years out.  She will be due for follow-up echo prior to her annual follow-up in February-March 2025.  Still has a crisp click. On warfarin with no bleeding  Discussed importance of SBE prophylaxis.  Will need to be asked for GI procedures as well as any detail dental procedures.  If the x-ray, we will be happy to provide prescription.      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  Stable.  Followed by warfarin clinic.  No active bleeding.  Would need to discuss holding options with Coumadin clinic team.  With mechanical valve, does need to have bridging.  No data to support use of DOAC with mechanical valve.      ===================================  HPI:    Victoria [name] is a 62 y.o. female with a PMH  notable for Congenital Bicuspid Aortic Valve w/ Severe AS => s/p AVR/Bentall (81mm St. Jude Mechanical AoV & L Carotid Bypass (12/2009) along w/ HTN & HLD who presents today for annual follow-up at the request of Jearld Fenton, NP.  Victoria [name] was last seen in February 2023 actually as a 17-month follow-up from an evaluation in December 2022 for chest pain thought to be atypical musculoskeletal pain.  Her most recent echo was in February 2022.  She is still having some musculoskeletal left lower rib cage discomfort with coughing.  Likely musculoskeletal.  Otherwise no real heart failure symptoms, angina, arrhythmia symptoms.  No issues with warfarin.  No bleeding issues.  Recent Hospitalizations: None  Reviewed  CV studies:    The following studies were reviewed today: (if available, images/films reviewed: From Epic Chart or Care Everywhere) No recent studies:  Interval History:   Victoria [name] presents today for annual follow-up.  Clinically she is doing very well from a cardiac standpoint.  She is sad that her mother recently placed on hospice in February after not being able to tolerate chemotherapy and radiation.  Was seen by PCP in February the PCPs office-noted being mildly dysphoric, nervous and anxious.  She has not had any cardiac symptoms of chest pain pressure or dyspnea with rest or exertion.  She just has some mild stable MSK related chest pain.  She notes  that she did having a lot of back pain as well as knee pain but the back pain really seems a bit worse since having GI surgery procedure.  It is really limiting her activity levels.  Otherwise she has not had any issues with heart failure such as PND orthopnea or edema.  No review of systems or syncope/near syncope.  No TIA's.  No claudication.  No bleeding issues on long-term warfarin for mechanical valve.  No signs or symptoms to suggest SBE.   REVIEWED OF SYSTEMS   Review of Systems  Constitutional:  Positive for  weight loss (5 pounds in a year). Negative for chills, fever and malaise/fatigue.  Cardiovascular:  Positive for chest pain (Chest wall pain still there a little bit but not nearly as bad as last year.).  Gastrointestinal:  Negative for blood in stool and melena.  Genitourinary:  Negative for hematuria.  Musculoskeletal:  Positive for back pain (Has been having back pain on left side with some neuropathy to symptoms.) and joint pain.  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 Negative for dizziness and focal weakness.  Endo/Heme/Allergies:  Does not bruise/bleed easily.  Psychiatric/Behavioral:  Negative for depression and memory loss. The patient is not nervous/anxious and does not have insomnia.   All other systems reviewed and are negative.   I have reviewed and (if needed) personally updated the patient's problem list, medications, allergies, past medical and surgical history, social and family history.   PAST MEDICAL HISTORY   Past Medical History:  Diagnosis Date   History of bicuspid aortic valve     with severe AS and dilated aortic root/arch.   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 Jude 21 mm mechanical valve, 24 mm IMA shield vascular graft for the ascending aorta/arch), L carotid artery bypassing    PAST SURGICAL HISTORY   Past Surgical History:  Procedure Laterality Date   ABDOMINAL HYSTERECTOMY  2000   total   ANTERIOR CRUCIATE LIGAMENT REPAIR Left 2002   AORTIC VALVE REPLACEMENT  12/2009   St. Jude 29mm mechanical valve, Bentall procesure & L cartoid artery bypassing    BENTALL PROCEDURE  12/2009   See above   CARDIAC CATHETERIZATION  11/2009   No significant coronary disease. Normal LV function.   INSERTION OF MESH  10/27/2021   Procedure: INSERTION OF MESH;  Surgeon: Jovita Kussmaul, MD;  Location: [place];  Service: General;;   TRANSTHORACIC ECHOCARDIOGRAM  01/27/2021   EF 60 to 65%.  No  RWMA.  Unable to assess LV diastolic parameters because of MAC.  RVP seems normal.  Normal function.  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  21 mm Saint Jude mechanical aortic valve in place.  Mean gradient 23.8 mmHg.  Peak 46.9 mmHg. Aortic root has been replaced.  Mild eccentric AI noted.   TRANSTHORACIC ECHOCARDIOGRAM  1/'16, 1/19   a) 12/2014: Normal EF (60-65%), Normal wall motion. 21 mm St Jude Mech AoV Prosthesis- well seated, mild AI (not peri-valvular leak as originally noted). Peak-Mean Gradients: 35 mmHg & 22 mHg (likely size related).  Stable;; b) 12/2017: EF 60-65%.  Mild LVH.  Prosthetic aortic valve opens well.  Peak-mean gradients 36 and 19 mmHg respectively.  Appropriate for prosthesis.  Mild MR.  Trivial AI.   VENTRAL HERNIA REPAIR N/A 10/27/2021   Procedure: VENTRAL HERNIA REPAIR;  Surgeon: Jovita Kussmaul, MD;  Location: [place];  Service: General;  Laterality: N/A;   MEDICATIONS/ALLERGIES   Current Meds  Medication Sig   amLODipine (NORVASC) 10 MG tablet TAKE 1 TABLET BY MOUTH EVERY DAY   amLODipine (NORVASC) 5 MG tablet TAKE 2 TABLETS DAILY =>  (Patient taking differently: Take 5 mg by mouth daily.)   atorvastatin (LIPITOR) 40 MG tablet Take 1 tablet (40 mg total) by mouth daily. KEEP OV.   carvedilol (COREG) 3.125 MG tablet TAKE 1 TABLET BY MOUTH EVERY 12 HOURS   Cholecalciferol (VITAMIN D3) 50 MCG (2000 UT) CAPS Take 1 capsule (2,000 Units total) by mouth daily.   [START ON 09/12/2023] Vitamin D, Ergocalciferol, (DRISDOL) 1.25 MG (50000 UNIT) CAPS capsule TAKE 1 CAPSULE BY MOUTH ONCE A WEEK FOR 12 DOSES. TAKE WITH FOOD   warfarin (COUMADIN) 5 MG tablet TAKE 1 TO 1 & 1/2 TABLETS BY MOUTH DAILY AS DIRECTED BY COUMADIN CLINIC *90 DAY SUPPLY    Allergies  Allergen Reactions   Lisinopril     Mood changes    SOCIAL HISTORY/FAMILY HISTORY   Reviewed in Epic:  Pertinent findings:  Social History   Tobacco Use   Smoking status: Former    Types: Cigarettes     Quit date: 01/01/2010    Years since quitting: 13.2   Smokeless tobacco: Never  Vaping Use   Vaping Use: Never used  Substance Use Topics   Alcohol use: Yes    Alcohol/week: 30.0 standard drinks of alcohol    Types: 12 Standard drinks or equivalent, 18 Cans of beer per week   Drug use: No   Social History   Social History Narrative   She is a divorced mother of one, grandmother 15. She usually exercises 4-5 days a week. Very active.   Quit smoking in 2011.   Takes 1-2 beers per day and is stable.    OBJCTIVE -PE, EKG, labs   Wt Readings from Last 3 Encounters:  03/15/23 122 lb 3.2 oz (55.4 kg)  03/26/22 127 lb (57.6 kg)  01/30/22 127 lb 3.2 oz (57.7 kg)    Physical Exam: BP 126/84   Pulse 64   Ht 5' 0.5" (1.537 m)   Wt 122 lb 3.2 oz (55.4 kg)   SpO2 98%   BMI 23.47 kg/m  Physical Exam Vitals reviewed.  Constitutional:      General: She is not in acute distress.    Appearance: Normal appearance. She is normal weight. She is not ill-appearing (Well-nourished and well-groomed.  Healthy-appearing.) or toxic-appearing.  HENT:     Head: Normocephalic and atraumatic.  Neck:     Vascular: No carotid bruit (Radiated aortic valve murmur) or JVD.  Cardiovascular:     Rate and Rhythm: Normal rate and regular rhythm. No extrasystoles are present.    Chest Wall: PMI is not displaced.     Pulses: Normal pulses.     Heart sounds: Murmur heard.     Harsh crescendo-decrescendo midsystolic murmur is present with a grade of 3/6 at the upper right sternal border radiating to the neck.     No friction rub. No gallop. No S4 sounds.     Comments: Normal S1 with crisp metallic S2 Pulmonary:     Effort: Pulmonary effort is normal. No respiratory distress.     Breath sounds: Normal breath sounds. No wheezing, rhonchi or rales.  Chest:     Chest wall: Tenderness (Some chest wall tenderness along the left-sided rib.  This is chronic.) present.  Musculoskeletal:  General: No swelling.  Normal range of motion.     Cervical back: Normal range of motion and neck supple.  Skin:    General: Skin is warm and dry.  Neurological:     General: No focal deficit present.     Mental Status: She is alert and oriented to person, place, and time.  Psychiatric:        Mood and Affect: Mood normal.        Behavior: Behavior normal.        Thought Content: Thought content normal.        Judgment: Judgment normal.    Adult ECG Report  Rate: 64;  Rhythm: normal sinus rhythm and normal axis, with borderline short PR interval.  Otherwise normal voltage, durations ;   Narrative Interpretation: Stable  Recent Labs: Due for labs to be checked soon.  Interestingly, A1c was checked in March. Lab Results  Component Value Date   CHOL 166 01/25/2022   HDL 69 01/25/2022   LDLCALC 77 01/25/2022   TRIG 116 01/25/2022   CHOLHDL 2.4 01/25/2022   Lab Results  Component Value Date   CREATININE 0.61 01/25/2022   BUN 14 01/25/2022   NA 137 01/25/2022   K 4.4 01/25/2022   CL 99 01/25/2022   CO2 27 10/23/2021      Latest Ref Rng & Units 01/25/2022    9:08 AM 10/23/2021    3:21 PM 02/06/2021    9:29 AM  CBC  WBC 3.4 - 10.8 x10E3/uL 5.4  7.1  7.3   Hemoglobin 11.1 - 15.9 g/dL 12.4  12.8  13.2   Hematocrit 34.0 - 46.6 % 37.5  39.2  40.1   Platelets 150 - 450 x10E3/uL 188  193  171     Lab Results  Component Value Date   HGBA1C 6.1 (H) 03/11/2023   Lab Results  Component Value Date   TSH 1.750 01/25/2022    ================================================== I spent a total of 22 minutes with the patient spent in direct patient consultation.  Additional time spent with chart review  / charting (studies, outside notes, etc): 11 min Total Time: 33 min  Current medicines are reviewed at length with the patient today.  (+/- concerns) n/a  Notice: This dictation was prepared with Dragon dictation along with [name] phrase technology. Any transcriptional errors that result from this process  are unintentional and may not be corrected upon review.  Studies Ordered:   Orders Placed This Encounter  Procedures   EKG 12-Lead   ECHOCARDIOGRAM COMPLETE   No orders of the defined types were placed in this encounter.   Patient Instructions / Medication Changes & Studies & Tests Ordered   Patient Instructions  Medication Instructions:   No changes *If you need a refill on your cardiac medications before your next appointment, please call your pharmacy*   Lab Work: Not needed If you have labs (blood work) drawn today and your tests are completely normal, you will receive your results only by: MyChart Message (if you have MyChart) OR A paper copy in the mail If you have any lab test that is abnormal or we need to change your treatment, we will call you to review the results.   Testing/Procedures: Will be schedule at [address] in Feb 2025  Your physician has requested that you have an echocardiogram. Echocardiography is a painless test that uses sound waves to create images of your heart. It provides your doctor with information about  the size and shape of your heart and how well your heart's chambers and valves are working. This procedure takes approximately one hour. There are no restrictions for this procedure. Please do NOT wear cologne, perfume, aftershave, or lotions (deodorant is allowed). Please arrive 15 minutes prior to your appointment time.    Follow-Up: At [facility], you and your health needs are our priority.  As part of our continuing mission to provide you with exceptional heart care, we have created designated Provider Care Teams.  These Care Teams include your primary Cardiologist (physician) and Advanced Practice Providers (APPs -  Physician Assistants and Nurse Practitioners) who all work together to provide you with the care you need, when you need it.     Your next appointment:   12 month(s)  The format for your next  appointment:   In Person  Provider:   Glenetta Hew, MD      Leonie Man, MD, MS Glenetta Hew, M.D., M.S. Interventional Cardiologist  [place]  Pager # [phone] Phone # [phone] [address] [place], [place] [address]   Thank you for choosing [facility]!!

## 2023-03-15 NOTE — Patient Instructions (Addendum)
Medication Instructions:   No changes *If you need a refill on your cardiac medications before your next appointment, please call your pharmacy*   Lab Work: Not needed If you have labs (blood work) drawn today and your tests are completely normal, you will receive your results only by: [place] (if you have MyChart) OR A paper copy in the mail If you have any lab test that is abnormal or we need to change your treatment, we will call you to review the results.   Testing/Procedures: Will be schedule at [address] in Feb 2025  Your physician has requested that you have an echocardiogram. Echocardiography is a painless test that uses sound waves to create images of your heart. It provides your doctor with information about the size and shape of your heart and how well your heart's chambers and valves are working. This procedure takes approximately one hour. There are no restrictions for this procedure. Please do NOT wear cologne, perfume, aftershave, or lotions (deodorant is allowed). Please arrive 15 minutes prior to your appointment time.    Follow-Up: At [facility], you and your health needs are our priority.  As part of our continuing mission to provide you with exceptional heart care, we have created designated Provider Care Teams.  These Care Teams include your primary Cardiologist (physician) and Advanced Practice Providers (APPs -  Physician Assistants and Nurse Practitioners) who all work together to provide you with the care you need, when you need it.     Your next appointment:   12 month(s)  The format for your next appointment:   In Person  Provider:   Glenetta Hew, MD

## 2023-03-26 ENCOUNTER — Other Ambulatory Visit: Payer: Self-pay | Admitting: Cardiovascular Disease

## 2023-03-26 ENCOUNTER — Encounter: Payer: Self-pay | Admitting: Cardiology

## 2023-03-26 DIAGNOSIS — Z7901 Long term (current) use of anticoagulants: Secondary | ICD-10-CM

## 2023-03-26 NOTE — Assessment & Plan Note (Signed)
Not unexpectedly aortic atherosclerosis noted on CT scans aortic root replacement likely exacerbated by aortic valve disease.  She is on high-dose statin and amlodipine for blood pressure.  Had previously been on higher dose but is now on lower dose carvedilol.

## 2023-03-26 NOTE — Assessment & Plan Note (Signed)
BP actually looks pretty well-controlled today she is on carvedilol 3.125 mg twice daily and actually only taking 5 mg amlodipine daily.  She has been on 10.

## 2023-03-26 NOTE — Assessment & Plan Note (Addendum)
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  Stable.  Followed by warfarin clinic.  No active bleeding.  Would need to discuss holding options with Coumadin clinic team.  With mechanical valve, does need to have bridging.  No data to support use of DOAC with mechanical valve.

## 2023-03-26 NOTE — Telephone Encounter (Signed)
Warfarin 5mg  refill S/p AVR Last INR 03/12/23 Last OV 03/15/23

## 2023-03-26 NOTE — Assessment & Plan Note (Signed)
Doing well post Bentall.  She is now 13 years out.  She will be due for follow-up echo prior to her annual follow-up in February-March 2025.  Still has a crisp click. On warfarin with no bleeding  Discussed importance of SBE prophylaxis.  Will need to be asked for GI procedures as well as any detail dental procedures.  If the x-ray, we will be happy to provide prescription.

## 2023-04-08 ENCOUNTER — Other Ambulatory Visit: Payer: Self-pay | Admitting: Occupational Medicine

## 2023-04-08 ENCOUNTER — Telehealth: Payer: Self-pay | Admitting: *Deleted

## 2023-04-08 DIAGNOSIS — Z952 Presence of prosthetic heart valve: Secondary | ICD-10-CM

## 2023-04-08 DIAGNOSIS — I700 Atherosclerosis of aorta: Secondary | ICD-10-CM

## 2023-04-08 DIAGNOSIS — Z7901 Long term (current) use of anticoagulants: Secondary | ICD-10-CM

## 2023-04-08 NOTE — Telephone Encounter (Signed)
Spoke with pt since INR is due today, pt states she is going tomorrow to have labs drawn. Advised will follow up at that time.

## 2023-04-09 ENCOUNTER — Other Ambulatory Visit: Payer: No Typology Code available for payment source | Admitting: Occupational Medicine

## 2023-04-09 DIAGNOSIS — Z7901 Long term (current) use of anticoagulants: Secondary | ICD-10-CM

## 2023-04-09 NOTE — Progress Notes (Signed)
Lab drawn from Left AC tolerated well no issues noted.   

## 2023-04-10 ENCOUNTER — Ambulatory Visit (INDEPENDENT_AMBULATORY_CARE_PROVIDER_SITE_OTHER): Payer: No Typology Code available for payment source | Admitting: *Deleted

## 2023-04-10 DIAGNOSIS — Z952 Presence of prosthetic heart valve: Secondary | ICD-10-CM | POA: Diagnosis not present

## 2023-04-10 DIAGNOSIS — Z5181 Encounter for therapeutic drug level monitoring: Secondary | ICD-10-CM

## 2023-04-10 DIAGNOSIS — Z7901 Long term (current) use of anticoagulants: Secondary | ICD-10-CM | POA: Diagnosis not present

## 2023-04-10 LAB — PROTIME-INR
INR: 2.6 — ABNORMAL HIGH (ref 0.9–1.2)
Prothrombin Time: 25.9 s — ABNORMAL HIGH (ref 9.1–12.0)

## 2023-04-10 NOTE — Progress Notes (Signed)
Noted patient spoke with anticoagulation clinic staff next lab due 05/07/23 RN Kimrey notified/scheduled

## 2023-04-10 NOTE — Patient Instructions (Signed)
Description   Called and spoke with pt and instructed her to continue taking Warfarin 1.5 tablets daily except for 1 tablet on Monday and Thursdays.  Stay consistent with greens each week. Recheck INR in 4 weeks at lab. Coumadin Clinic [phone]

## 2023-05-07 ENCOUNTER — Encounter: Payer: Self-pay | Admitting: Registered Nurse

## 2023-05-07 ENCOUNTER — Telehealth: Payer: Self-pay | Admitting: Registered Nurse

## 2023-05-07 ENCOUNTER — Other Ambulatory Visit: Payer: No Typology Code available for payment source | Admitting: Occupational Medicine

## 2023-05-07 DIAGNOSIS — Z952 Presence of prosthetic heart valve: Secondary | ICD-10-CM

## 2023-05-07 DIAGNOSIS — Z0000 Encounter for general adult medical examination without abnormal findings: Secondary | ICD-10-CM

## 2023-05-07 DIAGNOSIS — Z7901 Long term (current) use of anticoagulants: Secondary | ICD-10-CM

## 2023-05-07 DIAGNOSIS — I700 Atherosclerosis of aorta: Secondary | ICD-10-CM

## 2023-05-07 NOTE — Progress Notes (Signed)
Lab drawn from Left AC tolerated well no issues noted.   

## 2023-05-07 NOTE — Telephone Encounter (Signed)
Epic reviewed had Hgba1c 6.1 and vitamin D 56 11 Mar 2023; 6.0 13-Jan-2023 and 5.9 2022-08-12  mother died this spring will recheck in 6 months and vitamin D in 1 year.  Executive panel fasting ordered for patient.  BP 126/84 03/15/23 cardiology visit met requirements for 2025 Be Well insurance discount please signed paperwork with patient

## 2023-05-08 ENCOUNTER — Ambulatory Visit (INDEPENDENT_AMBULATORY_CARE_PROVIDER_SITE_OTHER): Payer: No Typology Code available for payment source | Admitting: *Deleted

## 2023-05-08 DIAGNOSIS — Z5181 Encounter for therapeutic drug level monitoring: Secondary | ICD-10-CM

## 2023-05-08 DIAGNOSIS — Z952 Presence of prosthetic heart valve: Secondary | ICD-10-CM | POA: Diagnosis not present

## 2023-05-08 DIAGNOSIS — Z7901 Long term (current) use of anticoagulants: Secondary | ICD-10-CM | POA: Diagnosis not present

## 2023-05-08 LAB — PROTIME-INR
INR: 2.7 — ABNORMAL HIGH (ref 0.9–1.2)
Prothrombin Time: 27.1 s — ABNORMAL HIGH (ref 9.1–12.0)

## 2023-05-08 NOTE — Patient Instructions (Signed)
Description   Called and spoke with pt and instructed her to continue taking Warfarin 1.5 tablets daily except for 1 tablet on Monday and Thursdays.  Stay consistent with greens each week. Recheck INR in 4 weeks at lab. Coumadin Clinic [phone]     

## 2023-05-19 NOTE — Telephone Encounter (Signed)
Noted patient has appt scheduled 06/04/23 with RN Bess Kinds

## 2023-05-27 ENCOUNTER — Encounter: Payer: Self-pay | Admitting: Registered Nurse

## 2023-05-27 DIAGNOSIS — R300 Dysuria: Secondary | ICD-10-CM

## 2023-05-27 MED ORDER — PHENAZOPYRIDINE HCL 200 MG PO TABS: 200.0000 mg | ORAL_TABLET | Freq: Three times a day (TID) | ORAL | 0 refills | Status: DC

## 2023-05-28 ENCOUNTER — Ambulatory Visit: Payer: No Typology Code available for payment source | Admitting: Occupational Medicine

## 2023-05-28 DIAGNOSIS — R300 Dysuria: Secondary | ICD-10-CM

## 2023-05-28 LAB — POCT URINALYSIS DIPSTICK

## 2023-05-28 NOTE — Progress Notes (Signed)
Patient on Azo for pain. Urine sample collected today and sent out. NP made aware

## 2023-05-29 MED ORDER — PHENAZOPYRIDINE HCL 200 MG PO TABS: 200.0000 mg | ORAL_TABLET | Freq: Three times a day (TID) | ORAL | 0 refills | Status: DC

## 2023-05-29 MED ORDER — FOSFOMYCIN TROMETHAMINE 3 G PO PACK: 3.0000 g | PACK | Freq: Once | ORAL | 0 refills | Status: AC

## 2023-05-29 NOTE — Telephone Encounter (Unsigned)
Spoke with patient via telephone as she went to clinic complained of burning with urination even with pyridium use.  Patient denied back pain/n/v/d/headache/swelling hands/feet or fever/chills but has noticed changes in color of urine and blood in urine.  Discussed urine culture results still pending.  She has tolerated augmentin when on coumadin and denied bleeding/bruising in 2018 and had ancef with surgery in 2022.  Discussed with patient due to urine culture results still pending and no history of urine culture in Epic unsure if antibiotic resistant bacteria present.  Per epocrates increased risk of bleeding with coumadin and all typical antibiotics used for UTI e.g. augmentin, fosfomycin, nitrofurantoin, bactrim DS.  Discussed have seen drug resistance to bactrim and augmentin on other patient urine cultures this year.  Prefer not to start an antibiotic that could cause bleeding and not be effective for UTI.  Discussed with patient will contact cardiology clinic to see if any bigger problems seen with fosfomycin or nitrofurantoin.  Provider not aware of any but recommended if weeklong therapy to get PT/INR half way through antibiotic course.  Patient prefers single dose fosfomycin 3mg  po x 1  rx sent to her pharmacy along with refill of pyridium 200mg  po TID prn dysuria #6 RF0.  Discussed avoid dehydration.  Will notify patient of urine culture results once received.  Patient A&Ox3 spoke full sentences without difficulty respirations even and unlabored.  Patient agreed with plan of care and had no further questions at this time.  Duration of call 9 minutes.

## 2023-05-30 ENCOUNTER — Encounter: Payer: Self-pay | Admitting: Registered Nurse

## 2023-05-30 ENCOUNTER — Telehealth: Payer: Self-pay | Admitting: Registered Nurse

## 2023-05-30 DIAGNOSIS — Z0000 Encounter for general adult medical examination without abnormal findings: Secondary | ICD-10-CM

## 2023-05-30 DIAGNOSIS — R7303 Prediabetes: Secondary | ICD-10-CM

## 2023-05-30 NOTE — Telephone Encounter (Signed)
Epic reviewed had Hgba1c and office visit March 6.1 and vitamin D 56 BP 126/84 122# 5 feet tall cardiology met requirements complete paperwork with patient.  Do fasting executive panel with her sometime in the next 60 days with her PT/INR ordered.

## 2023-05-31 ENCOUNTER — Other Ambulatory Visit: Payer: Self-pay | Admitting: Cardiology

## 2023-05-31 LAB — URINE CULTURE

## 2023-06-02 NOTE — Telephone Encounter (Signed)
Spoke with patient in workcenter stated took fosfomycin yesterday and feeling a little better today.  Discussed urine culture results still pending will send my chart message this weekend when results available discussed sometimes takes up to 5 days if slow growing bacteria.  Continue water intake and avoid holding urine longer than 4 hours when awake.  Patient A&Ox3 spoke full sentences without difficulty skin warm dry and pink gait sure and steady in warehouse  Denied fever/chills/abdomen/back pain still has mild dysuria/burning with urination improved from yesterday.Patietnt agreed with plan of care and had no further questions at this time.

## 2023-06-04 ENCOUNTER — Other Ambulatory Visit: Payer: No Typology Code available for payment source | Admitting: Occupational Medicine

## 2023-06-04 VITALS — BP 123/78 | HR 56 | Ht 60.5 in | Wt 116.0 lb

## 2023-06-04 DIAGNOSIS — Z7901 Long term (current) use of anticoagulants: Secondary | ICD-10-CM

## 2023-06-04 DIAGNOSIS — I700 Atherosclerosis of aorta: Secondary | ICD-10-CM

## 2023-06-04 DIAGNOSIS — Z0000 Encounter for general adult medical examination without abnormal findings: Secondary | ICD-10-CM

## 2023-06-04 NOTE — Progress Notes (Signed)
Lab drawn from Left AC tolerated well no issues noted.   

## 2023-06-05 ENCOUNTER — Ambulatory Visit: Payer: No Typology Code available for payment source | Admitting: Occupational Medicine

## 2023-06-05 ENCOUNTER — Ambulatory Visit (INDEPENDENT_AMBULATORY_CARE_PROVIDER_SITE_OTHER): Payer: No Typology Code available for payment source | Admitting: *Deleted

## 2023-06-05 ENCOUNTER — Encounter: Payer: Self-pay | Admitting: Registered Nurse

## 2023-06-05 DIAGNOSIS — Z0000 Encounter for general adult medical examination without abnormal findings: Secondary | ICD-10-CM

## 2023-06-05 DIAGNOSIS — R7303 Prediabetes: Secondary | ICD-10-CM

## 2023-06-05 DIAGNOSIS — Z7901 Long term (current) use of anticoagulants: Secondary | ICD-10-CM | POA: Diagnosis not present

## 2023-06-05 DIAGNOSIS — Z952 Presence of prosthetic heart valve: Secondary | ICD-10-CM

## 2023-06-05 LAB — CMP12+LP+TP+TSH+6AC+CBC/D/PLT
ALT: 26 IU/L (ref 0–32)
AST: 39 IU/L (ref 0–40)
Albumin/Globulin Ratio: 1.8
Albumin: 4.8 g/dL (ref 3.8–4.9)
Alkaline Phosphatase: 88 IU/L (ref 44–121)
BUN/Creatinine Ratio: 17 (ref 12–28)
BUN: 11 mg/dL (ref 8–27)
Bilirubin Total: 1.3 mg/dL — ABNORMAL HIGH (ref 0.0–1.2)
Calcium: 9.4 mg/dL (ref 8.7–10.3)
Chloride: 99 mmol/L (ref 96–106)
Chol/HDL Ratio: 2.7 ratio (ref 0.0–4.4)
Cholesterol, Total: 191 mg/dL (ref 100–199)
Creatinine, Ser: 0.63 mg/dL (ref 0.57–1.00)
Estimated CHD Risk: <0.5 times avg. (ref 0.0–1.0)
Free Thyroxine Index: 1.8 (ref 1.2–4.9)
GGT: 17 IU/L (ref 0–60)
Globulin, Total: 2.7 g/dL (ref 1.5–4.5)
Glucose: 110 mg/dL — ABNORMAL HIGH (ref 70–99)
HDL: 70 mg/dL (ref >39)
Iron: 93 ug/dL (ref 27–159)
LDH: 379 IU/L — ABNORMAL HIGH (ref 119–226)
LDL Chol Calc (NIH): 100 mg/dL — ABNORMAL HIGH (ref 0–99)
Phosphorus: 3.7 mg/dL (ref 3.0–4.3)
Potassium: 4.7 mmol/L (ref 3.5–5.2)
Sodium: 136 mmol/L (ref 134–144)
T3 Uptake Ratio: 27 % (ref 24–39)
T4, Total: 6.8 ug/dL (ref 4.5–12.0)
TSH: 1.92 u[IU]/mL (ref 0.450–4.500)
Total Protein: 7.5 g/dL (ref 6.0–8.5)
Triglycerides: 121 mg/dL (ref 0–149)
Uric Acid: 5.8 mg/dL (ref 3.0–7.2)
VLDL Cholesterol Cal: 21 mg/dL (ref 5–40)
eGFR: 101 mL/min/{1.73_m2} (ref >59)

## 2023-06-05 LAB — PROTIME-INR
INR: 2.3 — ABNORMAL HIGH (ref 0.9–1.2)
Prothrombin Time: 23.4 s — ABNORMAL HIGH (ref 9.1–12.0)

## 2023-06-05 NOTE — Patient Instructions (Signed)
Description   Spoke with pt and instructed her to continue taking Warfarin 1.5 tablets daily except for 1 tablet on Monday and Thursdays.  Stay consistent with greens each week. Recheck INR in 4 weeks at lab. Coumadin Clinic [phone]

## 2023-06-05 NOTE — Progress Notes (Signed)
Be well insurance premium discount evaluation: Met  Epic reviewed by RN Kimrey transcribed labs and reviewed with the patient. Scheduled follow up labs.   Tobacco attestation signed. [facility] ROI formed signed. Forms placed in the chart.   Patient given handouts for [facility] pharmacies and discount drugs list, MyChart, [facility], [facility], [place] counseling and [facility].  What to do for infectious illness protocol. Given handout for list of medications that can be filled at [facility]. Given Clinic hours and Clinic Email.  

## 2023-06-07 NOTE — Telephone Encounter (Signed)
Patient seen in workcenter stated all symptoms resolved.  Be Well 2025 paperwork signed 06/06/23

## 2023-06-07 NOTE — Telephone Encounter (Signed)
Be Well paperwork signed 06/06/23

## 2023-06-07 NOTE — Telephone Encounter (Signed)
Spoke with patient 06/06/23 all symptoms resolved after dose fosfymycin

## 2023-06-09 NOTE — Progress Notes (Signed)
noted 

## 2023-06-27 ENCOUNTER — Other Ambulatory Visit: Payer: Self-pay | Admitting: Cardiology

## 2023-06-27 DIAGNOSIS — Z7901 Long term (current) use of anticoagulants: Secondary | ICD-10-CM

## 2023-06-28 NOTE — Telephone Encounter (Signed)
Warfarin 5mg  refill S/p AVR Last INR 06/05/23 Last OV 03/15/23

## 2023-07-02 ENCOUNTER — Other Ambulatory Visit: Payer: No Typology Code available for payment source | Admitting: Occupational Medicine

## 2023-07-02 DIAGNOSIS — Z7901 Long term (current) use of anticoagulants: Secondary | ICD-10-CM

## 2023-07-02 DIAGNOSIS — Z952 Presence of prosthetic heart valve: Secondary | ICD-10-CM

## 2023-07-02 NOTE — Progress Notes (Signed)
Lab drawn from Left AC tolerated well no issues noted.   

## 2023-07-03 ENCOUNTER — Ambulatory Visit (INDEPENDENT_AMBULATORY_CARE_PROVIDER_SITE_OTHER): Payer: No Typology Code available for payment source

## 2023-07-03 DIAGNOSIS — Z5181 Encounter for therapeutic drug level monitoring: Secondary | ICD-10-CM

## 2023-07-03 DIAGNOSIS — Z952 Presence of prosthetic heart valve: Secondary | ICD-10-CM

## 2023-07-03 LAB — PROTIME-INR
INR: 2.2 — ABNORMAL HIGH (ref 0.9–1.2)
Prothrombin Time: 22.8 s — ABNORMAL HIGH (ref 9.1–12.0)

## 2023-07-03 NOTE — Patient Instructions (Signed)
Description   Spoke with pt and instructed her to continue taking Warfarin 1.5 tablets daily except for 1 tablet on Monday and Thursdays.  Stay consistent with greens each week.  Recheck INR in 5 weeks at lab.  Coumadin Clinic [phone]

## 2023-07-15 NOTE — Progress Notes (Signed)
These were follow up labs LDL 100 Hgba1c 6.1 signed provider Be Well 2025 paperwork 06/06/23 for patient.  Per epic patient read results 03/12/23.  RN Kimrey notified HR team patient met insurance discount requirements.  Changes to vitamin D testing guidelines and no further routine testing recommended and patient to continue supplement year round at least 1000 units daily with meal  Paitent has had follow up PT/INR testing and follow up with anticoagulation clinic staff since these labs and stable at this time

## 2023-08-06 ENCOUNTER — Other Ambulatory Visit: Payer: No Typology Code available for payment source

## 2023-08-06 DIAGNOSIS — Z7901 Long term (current) use of anticoagulants: Secondary | ICD-10-CM

## 2023-08-07 ENCOUNTER — Ambulatory Visit (INDEPENDENT_AMBULATORY_CARE_PROVIDER_SITE_OTHER): Payer: No Typology Code available for payment source

## 2023-08-07 DIAGNOSIS — Z5181 Encounter for therapeutic drug level monitoring: Secondary | ICD-10-CM | POA: Diagnosis not present

## 2023-08-07 NOTE — Progress Notes (Signed)
My chart message sent to patient and results forwarded to anticoagulation clinic RN  Results forwarded to anticoagulation clinic and patient notified to follow up with them to ensure no dose changes and when next labs due.   Your PT 17.8 and INR 1.6 was not in goal 2-3 follow up with anticoagulation clinic regarding when to have next lab and dosing change instructions.  Please let us know if you have further questions.  Sincerely,  Albina Billet NP-C

## 2023-08-07 NOTE — Patient Instructions (Signed)
Description   Spoke with pt and instructed her to take 2 tablets today and 1.5 tablets tomorrow and then continue taking Warfarin 1.5 tablets daily except for 1 tablet on Monday and Thursdays.  Stay consistent with greens each week.  Recheck INR in 3 weeks at lab.  [facility] [phone]

## 2023-08-27 ENCOUNTER — Other Ambulatory Visit: Payer: Self-pay | Admitting: Registered Nurse

## 2023-08-27 ENCOUNTER — Telehealth: Payer: Self-pay | Admitting: *Deleted

## 2023-08-27 ENCOUNTER — Other Ambulatory Visit: Payer: No Typology Code available for payment source

## 2023-08-27 NOTE — Progress Notes (Unsigned)
Client by clinic this morning to have blood drawn for her INR. Blood drawn left antecubital and client tolerated procedure well.  Reece Packer, RN, BSN, MPH, [facility]

## 2023-08-27 NOTE — Telephone Encounter (Signed)
Called pt since INR is due, she states she went this morning at 8am to have labs drawn. Advised will call back once we receive the results.

## 2023-08-28 ENCOUNTER — Ambulatory Visit (INDEPENDENT_AMBULATORY_CARE_PROVIDER_SITE_OTHER): Payer: No Typology Code available for payment source

## 2023-08-28 DIAGNOSIS — Z5181 Encounter for therapeutic drug level monitoring: Secondary | ICD-10-CM | POA: Diagnosis not present

## 2023-08-28 LAB — PROTIME-INR
INR: 2.6 — ABNORMAL HIGH (ref 0.9–1.2)
Prothrombin Time: 26.7 s — ABNORMAL HIGH (ref 9.1–12.0)

## 2023-08-28 NOTE — Patient Instructions (Signed)
Description   Spoke with pt and instructed her to continue taking Warfarin 1.5 tablets daily except for 1 tablet on Monday and Thursdays.  Stay consistent with greens each week. Recheck INR in 4 weeks at lab. Coumadin Clinic [phone]

## 2023-09-04 ENCOUNTER — Ambulatory Visit: Payer: Self-pay

## 2023-09-04 ENCOUNTER — Other Ambulatory Visit: Payer: Self-pay

## 2023-09-04 ENCOUNTER — Telehealth: Payer: Self-pay | Admitting: Cardiology

## 2023-09-04 ENCOUNTER — Emergency Department (HOSPITAL_BASED_OUTPATIENT_CLINIC_OR_DEPARTMENT_OTHER)
Admission: EM | Admit: 2023-09-04 | Discharge: 2023-09-04 | Disposition: A | Discharge status: Home / Self Care | Payer: No Typology Code available for payment source | Attending: Emergency Medicine | Admitting: Emergency Medicine

## 2023-09-04 ENCOUNTER — Other Ambulatory Visit (HOSPITAL_BASED_OUTPATIENT_CLINIC_OR_DEPARTMENT_OTHER): Payer: Self-pay

## 2023-09-04 ENCOUNTER — Encounter (HOSPITAL_BASED_OUTPATIENT_CLINIC_OR_DEPARTMENT_OTHER): Payer: Self-pay | Admitting: Emergency Medicine

## 2023-09-04 DIAGNOSIS — R040 Epistaxis: Secondary | ICD-10-CM | POA: Insufficient documentation

## 2023-09-04 DIAGNOSIS — Z79899 Other long term (current) drug therapy: Secondary | ICD-10-CM | POA: Insufficient documentation

## 2023-09-04 DIAGNOSIS — I10 Essential (primary) hypertension: Secondary | ICD-10-CM | POA: Diagnosis not present

## 2023-09-04 DIAGNOSIS — Z7901 Long term (current) use of anticoagulants: Secondary | ICD-10-CM | POA: Diagnosis not present

## 2023-09-04 LAB — CBC
HCT: 38.1 % (ref 36.0–46.0)
Hemoglobin: 12.7 g/dL (ref 12.0–15.0)
MCH: 31 pg (ref 26.0–34.0)
MCHC: 33.3 g/dL (ref 30.0–36.0)
MCV: 92.9 fL (ref 80.0–100.0)
Platelets: 240 10*3/uL (ref 150–400)
RBC: 4.1 MIL/uL (ref 3.87–5.11)
RDW: 13.3 % (ref 11.5–15.5)
WBC: 8.3 10*3/uL (ref 4.0–10.5)
nRBC: 0 % (ref 0.0–0.2)

## 2023-09-04 LAB — PROTIME-INR
INR: 2.1 — ABNORMAL HIGH (ref 0.8–1.2)
Prothrombin Time: 23.8 s — ABNORMAL HIGH (ref 11.4–15.2)

## 2023-09-04 MED ORDER — SALINE SPRAY 0.65 % NA SOLN: 1.0000 | NASAL | Status: DC | PRN

## 2023-09-04 MED ORDER — OXYMETAZOLINE HCL 0.05 % NA SOLN
1.0000 | Freq: Once | NASAL | Status: AC
  Administered 2023-09-04: 1 via NASAL
  Filled 2023-09-04: qty 30

## 2023-09-04 MED ORDER — LIDOCAINE VISCOUS HCL 2 % MT SOLN
15.0000 mL | Freq: Once | OROMUCOSAL | Status: AC
  Administered 2023-09-04: 15 mL via OROMUCOSAL
  Filled 2023-09-04: qty 15

## 2023-09-04 MED ORDER — HYDROCODONE-ACETAMINOPHEN 5-325 MG PO TABS: 1.0000 | ORAL_TABLET | ORAL | 0 refills | Status: DC | PRN

## 2023-09-04 MED ORDER — CEPHALEXIN 500 MG PO CAPS: 500.0000 mg | ORAL_CAPSULE | Freq: Three times a day (TID) | ORAL | 0 refills | Status: AC

## 2023-09-04 MED ORDER — SILVER NITRATE-POT NITRATE 75-25 % EX MISC
1.0000 | Freq: Once | CUTANEOUS | Status: DC
  Filled 2023-09-04: qty 10

## 2023-09-04 NOTE — Telephone Encounter (Signed)
     Chief Complaint: Nosebleed this morning and "I have tissue or a clot in my nose." On Coumadin. Symptoms: Above Frequency: Today Pertinent Negatives: Patient denies  Disposition: [] ED /[x] Urgent Care (no appt availability in office) / [] Appointment(In office/virtual)/ []  [facility]/ [] Home Care/ [] Refused Recommended Disposition /[] [facility]/ []  Follow-up with PCP Additional Notes: No availability, pt. Agrees with UC.  Reason for Disposition  Patient sounds very sick or weak to the triager  Answer Assessment - Initial Assessment Questions 1. AMOUNT OF BLEEDING: "How bad is the bleeding?" "How much blood was lost?" "Has the bleeding stopped?"   - MILD: needed a couple tissues   - MODERATE: needed many tissues   - SEVERE: large blood clots, soaked many tissues, lasted more than 30 minutes      Severe 2. ONSET: "When did the nosebleed start?"      Today 3. FREQUENCY: "How many nosebleeds have you had in the last 24 hours?"      1 4. RECURRENT SYMPTOMS: "Have there been other recent nosebleeds?" If Yes, ask: "How long did it take you to stop the bleeding?" "What worked best?"      Yes 5. CAUSE: "What do you think caused this nosebleed?"     Unsure 6. LOCAL FACTORS: "Do you have any cold symptoms?", "Have you been rubbing or picking at your nose?"     No 7. SYSTEMIC FACTORS: "Do you have high blood pressure or any bleeding problems?"     On blood thinners 8. BLOOD THINNERS: "Do you take any blood thinners?" (e.g., aspirin, clopidogrel / Plavix, coumadin, heparin). Notes: Other strong blood thinners include: Arixtra (fondaparinux), Eliquis (apixaban), Pradaxa (dabigatran), and Xarelto (rivaroxaban).     Coumadin 9. OTHER SYMPTOMS: "Do you have any other symptoms?" (e.g., lightheadedness)     Off balance 10. PREGNANCY: "Is there any chance you are pregnant?" "When was your last menstrual period?"       No  Protocols used: Nosebleed-A-AH

## 2023-09-04 NOTE — ED Notes (Signed)
Pt still bleeding slightly after previous MD intervention. MD notified.

## 2023-09-04 NOTE — ED Notes (Signed)
PT continues bleeding after rocket placement. MD notified. MD inflated balloon further.

## 2023-09-04 NOTE — Telephone Encounter (Signed)
Pt c/o medication issue:  1. Name of Medication: Warfarin  2. How are you currently taking this medication (dosage and times per day)?   3. Are you having a reaction (difficulty breathing--STAT)?   4. What is your medication issue? Nose is bleeding

## 2023-09-04 NOTE — ED Provider Notes (Signed)
[place] EMERGENCY DEPARTMENT AT [facility] Provider Note   CSN: [id number] Arrival date & time: 09/04/23  6237     History  No chief complaint on file.   Victoria [name] is a 62 y.o. female.  Pt is a 62 yo female with pmhx significant for htn, hld, hx bicuspid aortic valve s/p replacement with St. Jude's mechanical valve and on coumadin.  Pt woke up this am and was fine.  She took a shower and blew her nose and blood came out.  She had a blood clot that she could not get out.  She was afraid to pull it.         Home Medications Prior to Admission medications   Medication Sig Start Date End Date Taking? Authorizing Provider  cephALEXin (KEFLEX) 500 MG capsule Take 1 capsule (500 mg total) by mouth 3 (three) times daily for 5 days. 09/04/23 09/09/23 Yes Jacalyn Lefevre, MD  HYDROcodone-acetaminophen (NORCO/VICODIN) 5-325 MG tablet Take 1 tablet by mouth every 4 (four) hours as needed. 09/04/23  Yes Jacalyn Lefevre, MD  amLODipine (NORVASC) 10 MG tablet TAKE 1 TABLET BY MOUTH EVERY DAY 06/05/21   Lennette Bihari, MD  amLODipine (NORVASC) 5 MG tablet TAKE 2 TABLETS DAILY (NOTE DOSE 10MG  NOT AVAILABLE) 05/31/23   [name], MD  atorvastatin (LIPITOR) 40 MG tablet TAKE 1 TABLET (40 MG TOTAL) BY MOUTH DAILY. KEEP OV. 05/31/23   [name], MD  carvedilol (COREG) 3.125 MG tablet TAKE 1 TABLET BY MOUTH EVERY 12 HOURS 05/31/23   [name], MD  Cholecalciferol (VITAMIN D3) 50 MCG (2000 UT) CAPS Take 1 capsule (2,000 Units total) by mouth daily. 03/12/23 09/08/23  Betancourt, Jarold Song, NP  phenazopyridine (PYRIDIUM) 200 MG tablet Take 1 tablet (200 mg total) by mouth 3 (three) times daily. 05/29/23   Betancourt, Jarold Song, NP  Vitamin D, Ergocalciferol, (DRISDOL) 1.25 MG (50000 UNIT) CAPS capsule TAKE 1 CAPSULE BY MOUTH ONCE A WEEK FOR 12 DOSES. TAKE WITH FOOD 09/12/23 03/11/24  Betancourt, Jarold Song, NP  warfarin (COUMADIN) 5 MG tablet TAKE 1 TO 1 & 1/2 TABLETS BY MOUTH DAILY AS  DIRECTED BY COUMADIN CLINIC *90 DAY SUPPLY 06/28/23   [name], MD      Allergies    Lisinopril    Review of Systems   Review of Systems  HENT:  Positive for nosebleeds.   All other systems reviewed and are negative.   Physical Exam Updated Vital Signs BP (!) 134/90 (BP Location: Right Arm)   Pulse (!) 58   Temp 97.9 F (36.6 C) (Oral)   Resp 16   Ht 5' 0.5" (1.537 m)   Wt 53.1 kg   SpO2 98%   BMI 22.47 kg/m  Physical Exam Vitals and nursing note reviewed.  Constitutional:      Appearance: Normal appearance.  HENT:     Head: Normocephalic and atraumatic.     Right Ear: External ear normal.     Left Ear: External ear normal.     Nose: Nose normal.     Mouth/Throat:     Mouth: Mucous membranes are moist.     Pharynx: Oropharynx is clear.  Eyes:     Extraocular Movements: Extraocular movements intact.     Conjunctiva/sclera: Conjunctivae normal.     Pupils: Pupils are equal, round, and reactive to light.  Cardiovascular:     Rate and Rhythm: Normal rate and regular rhythm.     Pulses: Normal pulses.  Heart sounds: Normal heart sounds.  Pulmonary:     Effort: Pulmonary effort is normal.     Breath sounds: Normal breath sounds.  Abdominal:     General: Abdomen is flat. Bowel sounds are normal.     Palpations: Abdomen is soft.  Musculoskeletal:        General: Normal range of motion.     Cervical back: Normal range of motion and neck supple.  Skin:    General: Skin is warm.     Capillary Refill: Capillary refill takes less than 2 seconds.  Neurological:     General: No focal deficit present.     Mental Status: She is alert and oriented to person, place, and time.  Psychiatric:        Mood and Affect: Mood normal.        Behavior: Behavior normal.     ED Results / Procedures / Treatments   Labs (all labs ordered are listed, but only abnormal results are displayed) Labs Reviewed  PROTIME-INR - Abnormal; Notable for the following components:       Result Value   Prothrombin Time 23.8 (*)    INR 2.1 (*)    All other components within normal limits  CBC    EKG None  Radiology No results found.  Procedures .Epistaxis Management  Date/Time: 09/04/2023 10:58 AM  Performed by: Jacalyn Lefevre, MD Authorized by: Jacalyn Lefevre, MD   Consent:    Consent obtained:  Verbal   Consent given by:  Patient   Risks discussed:  Bleeding and pain   Alternatives discussed:  No treatment Universal protocol:    Procedure explained and questions answered to patient or proxy's satisfaction: yes     Patient identity confirmed:  Verbally with patient Procedure details:    Treatment site:  R anterior   Treatment method:  Silver nitrate   Treatment complexity:  Limited Post-procedure details:    Procedure completion:  Tolerated well, no immediate complications .Epistaxis Management  Date/Time: 09/04/2023 1:53 PM  Performed by: Jacalyn Lefevre, MD Authorized by: Jacalyn Lefevre, MD   Consent:    Consent obtained:  Verbal   Consent given by:  Patient Universal protocol:    Patient identity confirmed:  Verbally with patient Anesthesia:    Anesthesia method:  Topical application   Topical anesthetic:  Lidocaine gel Procedure details:    Treatment site:  R anterior   Treatment method:  Nasal balloon   Treatment episode: recurring   Post-procedure details:    Assessment:  Bleeding decreased   Procedure completion:  Tolerated well, no immediate complications .Epistaxis Management  Date/Time: 09/04/2023 1:53 PM  Performed by: Jacalyn Lefevre, MD Authorized by: Jacalyn Lefevre, MD   Consent:    Consent obtained:  Verbal   Consent given by:  Patient   Risks discussed:  Bleeding Universal protocol:    Patient identity confirmed:  Verbally with patient Procedure details:    Treatment site:  R anterior   Treatment method:  Nasal balloon   Treatment episode: recurring   Post-procedure details:    Assessment:  Bleeding decreased    Procedure completion:  Tolerated well, no immediate complications     Medications Ordered in ED Medications  silver nitrate applicators applicator 1 Application (has no administration in time range)  sodium chloride (OCEAN) 0.65 % nasal spray 1 spray (has no administration in time range)  oxymetazoline (AFRIN) 0.05 % nasal spray 1 spray (1 spray Each Nare Given 09/04/23 1012)  lidocaine (XYLOCAINE) 2 %  viscous mouth solution 15 mL (15 mLs Mouth/Throat Given 09/04/23 1207)    ED Course/ Medical Decision Making/ A&P                                 Medical Decision Making Amount and/or Complexity of Data Reviewed Labs: ordered.  Risk OTC drugs. Prescription drug management.   This patient presents to the ED for concern of nosebleed, this involves an extensive number of treatment options, and is a complaint that carries with it a high risk of complications and morbidity.  The differential diagnosis includes dry nose, coagulopathy   Co morbidities that complicate the patient evaluation  htn, hld, hx bicuspid aortic valve s/p replacement with St. Jude's mechanical valve and on coumadin   Additional history obtained:  Additional history obtained from epic chart review  Lab Tests:  I Ordered, and personally interpreted labs.  The pertinent results include:  cbc nl, inr 2.1  Medicines ordered and prescription drug management:  I ordered medication including afrin/nasal spray  for epistaxis  Reevaluation of the patient after these medicines showed that the patient improved I have reviewed the patients home medicines and have made adjustments as needed   Problem List / ED Course:  Epistaxis: Pt's nose started bleeding after afrin.  I attempted silver nitrate twice with no improvement.  Pt packed once with balloon and pt still had bleeding.  I removed that nasal packing, had her blow her nose, and placed another one.  I put 4 cc on air into balloon.  Pt has finally stopped  bleeding except for just a small amt of periodic dripping.  Pt is to f/u with ENT.  Return if worse.    Reevaluation:  After the interventions noted above, I reevaluated the patient and found that they have :improved   Social Determinants of Health:  Lives at home   Dispostion:  After consideration of the diagnostic results and the patients response to treatment, I feel that the patent would benefit from discharge with outpatient f/u.          Final Clinical Impression(s) / ED Diagnoses Final diagnoses:  Epistaxis  Anticoagulated on Coumadin    Rx / DC Orders ED Discharge Orders          Ordered    HYDROcodone-acetaminophen (NORCO/VICODIN) 5-325 MG tablet  Every 4 hours PRN        09/04/23 1441    cephALEXin (KEFLEX) 500 MG capsule  3 times daily        09/04/23 1441              Jacalyn Lefevre, MD 09/04/23 1444

## 2023-09-04 NOTE — ED Notes (Signed)
Ambulated patient around department. Pt reported she did not feel any bleeding and no bleeding occurred during ambulation. MD notified.

## 2023-09-04 NOTE — Telephone Encounter (Signed)
Spoke with patient and she states her nose have been bleeding since 6 am this morning. Advised to go to ED being that she has been unable to get nose to stop bleeding.

## 2023-09-04 NOTE — ED Triage Notes (Signed)
Pt is on coumadin

## 2023-09-04 NOTE — ED Triage Notes (Signed)
Pt awoke this am,after blowing nose, she says her nose started bleeding.there was one blood clot that she could not get out of her right nare, she also states she has swallowed a few blood clots on the way here.

## 2023-09-04 NOTE — Telephone Encounter (Signed)
noted 

## 2023-09-05 NOTE — Telephone Encounter (Signed)
Called and spoke with pt. INR was checked in ED yesterday and was 2.1. Pt states she had continued to take her Warfarin as prescribed. Pt is scheduled to have INR rechecked on 09/24/23. Pt understands she should call coumadin clinic with any changes.

## 2023-09-09 ENCOUNTER — Encounter (INDEPENDENT_AMBULATORY_CARE_PROVIDER_SITE_OTHER): Payer: Self-pay | Admitting: Otolaryngology

## 2023-09-09 ENCOUNTER — Ambulatory Visit (INDEPENDENT_AMBULATORY_CARE_PROVIDER_SITE_OTHER): Payer: No Typology Code available for payment source | Admitting: Otolaryngology

## 2023-09-09 VITALS — BP 141/72 | HR 60 | Ht 60.0 in | Wt 116.0 lb

## 2023-09-09 DIAGNOSIS — R040 Epistaxis: Secondary | ICD-10-CM

## 2023-09-09 DIAGNOSIS — Z7901 Long term (current) use of anticoagulants: Secondary | ICD-10-CM

## 2023-09-09 NOTE — Progress Notes (Signed)
Otolaryngology Clinic Note Referring physician: ED f/u HPI:  Victoria [name] is a 62 y.o. female kindly referred for ED f/u after epistaxis. PMHx: HTN, HLD, Bicuspid aortic valve s/p replacement (on Coumadin)  Patient reports she had a spontaneous episode of right sided epistaxis from  on 9/11. Denies antecedent event except nose blowing. Went to ED, afrin/pressure did not work. Silver nitrate applied x2 (unclear where), and then packed x2 with cessation. Placed on keflex.  Epistaxis history:  HTN: Yes, but normally wnl Frequency: does report minimal epistaxis approx 1x week in the morning, resolves by putting tissue up nose Side: right CKD/Liver dysfunction: No Anticoagulation/AP: On coumadin Trauma: Denies History of Sinusitis: Denies Nasal obstruction: Denies Nasal procedures: Denies Current nasal medication use: Denies home O2 or CPAP: denies  PMH/Meds/All/SocHx/FamHx/ROS:   Past Medical History:  Diagnosis Date   History of bicuspid aortic valve     with severe AS and dilated aortic root/arch.   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 Jude 21mm mechanical valve, Bentall procesure & L cartoid artery bypassing    BENTALL PROCEDURE  12/2009   See above   CARDIAC CATHETERIZATION  11/2009   No significant coronary disease. Normal LV function.   INSERTION OF MESH  10/27/2021   Procedure: INSERTION OF MESH;  Surgeon: Griselda Miner, MD;  Location: [facility] OR;  Service: General;;   TRANSTHORACIC ECHOCARDIOGRAM  01/27/2021   EF 60 to 65%.  No RWMA.  Unable to assess LV diastolic parameters because of MAC.  RVP seems normal.  Normal function.   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  21 mm Saint Jude mechanical aortic valve in place.  Mean gradient 23.8 mmHg.  Peak 46.9 mmHg. Aortic root has been replaced.  Mild eccentric AI noted.   TRANSTHORACIC ECHOCARDIOGRAM  1/'16, 1/19   a) 12/2014: Normal EF (60-65%), Normal wall motion. 21 mm St Jude Mech AoV Prosthesis- well seated, mild AI (not peri-valvular leak as originally noted). Peak-Mean Gradients: 35 mmHg & 22 mHg (likely size related).  Stable;; b) 12/2017: EF 60-65%.  Mild LVH.  Prosthetic aortic valve opens well.  Peak-mean gradients 36 and 19 mmHg respectively.  Appropriate for prosthesis.  Mild MR.  Trivial AI.   VENTRAL HERNIA REPAIR N/A 10/27/2021   Procedure: VENTRAL HERNIA REPAIR;  Surgeon: Griselda Miner, MD;  Location: [facility] OR;  Service: General;  Laterality: N/A;   Family History  Problem Relation Age of Onset   Breast cancer Mother    Stroke Father    Hypertension Father    Breast cancer Maternal Grandmother    Lung cancer Maternal Grandmother    No family history of bleeding disorders or difficulty with anesthesia  Social Connections: Unknown (05/07/2022)   Received from [facility]   Social Network    Social Network: Not on file     Current Outpatient Medications:    amLODipine (NORVASC) 10 MG tablet, TAKE 1 TABLET BY MOUTH EVERY DAY, Disp: 90 tablet, Rfl: 3   amLODipine (NORVASC) 5 MG tablet, TAKE 2 TABLETS DAILY (NOTE DOSE 10MG  NOT AVAILABLE), Disp: 180 tablet,  Rfl: 3   atorvastatin (LIPITOR) 40 MG tablet, TAKE 1 TABLET (40 MG TOTAL) BY MOUTH DAILY. KEEP OV., Disp: 90 tablet, Rfl: 3   carvedilol (COREG) 3.125 MG tablet, TAKE 1 TABLET BY MOUTH EVERY 12 HOURS, Disp: 180 tablet, Rfl: 3   cephALEXin (KEFLEX) 500 MG capsule, Take 1 capsule (500 mg total) by mouth 3 (three) times daily for 5 days., Disp: 15 capsule, Rfl: 0   HYDROcodone-acetaminophen (NORCO/VICODIN) 5-325 MG tablet, Take 1 tablet by mouth every 4 (four) hours as needed.,  Disp: 10 tablet, Rfl: 0   phenazopyridine (PYRIDIUM) 200 MG tablet, Take 1 tablet (200 mg total) by mouth 3 (three) times daily., Disp: 6 tablet, Rfl: 0   [START ON 09/12/2023] Vitamin D, Ergocalciferol, (DRISDOL) 1.25 MG (50000 UNIT) CAPS capsule, TAKE 1 CAPSULE BY MOUTH ONCE A WEEK FOR 12 DOSES. TAKE WITH FOOD, Disp: 12 capsule, Rfl: 1   warfarin (COUMADIN) 5 MG tablet, TAKE 1 TO 1 & 1/2 TABLETS BY MOUTH DAILY AS DIRECTED BY COUMADIN CLINIC *90 DAY SUPPLY, Disp: 135 tablet, Rfl: 1  A 20-point ROS was performed with pertinent positives/negatives noted in the HPI.    Physical Exam:   BP (!) 141/72 (BP Location: Right Arm, Patient Position: Sitting, Cuff Size: Normal)   Pulse 60   Ht 5' (1.524 m)   Wt 116 lb (52.6 kg)   SpO2 97%   BMI 22.65 kg/m    Salient findings:  CN II-XII intact  Bilateral EAC clear and TM intact with well pneumatized middle ear spaces Anterior rhinoscopy: Septum relatively midline with mild left deviation; see note below; afrin/lidocaine applied and then pack deflated and removed; rigid nasal endoscopy performed therafter given recent procedure for comprehensive nasal examination No lesions of oral cavity/oropharynx; no bleeding after removal; dention fair No obviously palpable neck masses/lymphadenopathy/thyromegaly No respiratory distress or stridor  Independent Review of Additional Tests or Records:  ED notes independently reviewed and summarized above Labs reviewd independently INR 2.1; Hgb 12.7 Procedures:  PROCEDURE: Bilateral Diagnostic Rigid Nasal Endoscopy with removal of right nasal pack Pre-procedure diagnosis: Nasal pack placement after epistaxis Post-procedure diagnosis: same Indication: See pre-procedure diagnosis and physical exam above Complications: None apparent EBL: <1 mL Anesthesia: Lidocaine 4% and topical decongestant was topically sprayed in each nasal cavity  Description of Procedure: Patient was identified and pack was moistened with  anesthetic and decongestant as noted above. Pack was deflated and removed. No active bleeding as noted in oropharynx or nasal. A 30 degree rigid endoscope was passed in each nasal cavity for examination and removed. Overall, signs of mucosal inflammation are noted. No mucopurulence, polyps, or masses noted.  A rigid 30 degree endoscope was utilized to evaluate the sinonasal cavities, mucosa, sinus ostia and turbinates and septum.  Modest excoriation and prominent vessel on left nasal septum, healing; no significant vessel identified on right but modest amount of clot in right MM and SE recess. Left MM and SE recess cleared. Some mucosal trauma along posterior aspect of right IT and head of right IT - suspect this was area that was cauterized   Impression & Plans:  Victoria [name] is a 62 y.o. female with h/o AVR on coumadin with R epistaxis requiring silver nitrate and pack placement. Pack removed without subsequent epistaxis  Right epistaxis: - Return precautions discussed - Humidification with Ocean spray or aquaphor each nostril BID x14d - No nose blowing - Afrin PRN for epistaxis - Finish course of keflex  RTC PRN in case epistaxis remains  a concern  MDM:  Level 3: 99203 Complexity/Problems addressed: low - prior notes reviewed; results reviewed Data complexity: - Morbidity: low  - Prescription Drug prescribed or managed: no     Thank you for allowing me the opportunity to care for your patient. Please do not hesitate to contact me should you have any other questions.  Sincerely, Jovita Kussmaul, MD Otolarynoglogist (ENT), [facility] Phone: [phone] Fax: [phone]  09/09/2023, 2:11 PM

## 2023-09-24 ENCOUNTER — Other Ambulatory Visit: Payer: No Typology Code available for payment source | Admitting: Registered Nurse

## 2023-09-24 ENCOUNTER — Telehealth: Payer: Self-pay | Admitting: *Deleted

## 2023-09-24 ENCOUNTER — Encounter: Payer: Self-pay | Admitting: Registered Nurse

## 2023-09-24 ENCOUNTER — Other Ambulatory Visit: Payer: Self-pay | Admitting: Registered Nurse

## 2023-09-24 ENCOUNTER — Ambulatory Visit: Payer: No Typology Code available for payment source | Admitting: Registered Nurse

## 2023-09-24 VITALS — BP 105/55 | HR 62 | Resp 16

## 2023-09-24 DIAGNOSIS — Z7901 Long term (current) use of anticoagulants: Secondary | ICD-10-CM

## 2023-09-24 DIAGNOSIS — Z952 Presence of prosthetic heart valve: Secondary | ICD-10-CM

## 2023-09-24 DIAGNOSIS — J301 Allergic rhinitis due to pollen: Secondary | ICD-10-CM

## 2023-09-24 DIAGNOSIS — R040 Epistaxis: Secondary | ICD-10-CM

## 2023-09-24 NOTE — Progress Notes (Unsigned)
   Subjective:    Patient ID: Victoria [name], female    DOB: 1961/02/12, 62 y.o.   MRN: [id number]  HPI    Review of Systems     Objective:   Physical Exam        Assessment & Plan:   Stop flonase, start ayr nasal gel, may continue nasal saline and antihistamine use.  On warfarin, blood pressure normal; mild post nasal drip on exam; scant bright red blood and scabs noted in nares avoid forceful nose blowing or picking nose.

## 2023-09-24 NOTE — Progress Notes (Signed)
Lab draw left ac x 1 blue top tube sent to [facility] will send my chart message with results once available and send copy of results to anticoagulation clinic staff tomorrow on receipt.  Gauze and cobain applied to venipuncture site and discussed to remove in 15 minutes  dressing clean dry and intact on discharge ambulatory patient A&Ox3 gait sure and steady respirations even and unlabored RA

## 2023-09-24 NOTE — Patient Instructions (Signed)
Nosebleed, Adult A nosebleed is when blood comes out of the nose. Nosebleeds are common. Usually, they are not a sign of a serious condition. Nosebleeds can happen if a blood vessel in your nose starts to bleed or if the lining of your nose (mucous membrane) cracks. They are commonly caused by: Allergies. Colds. Picking your nose. Blowing your nose too hard. An injury from sticking an object into your nose or getting hit in the nose. Dry or cold air. Less common causes of nosebleeds include: Toxic fumes. Something abnormal in the nose or in the air-filled spaces in the bones of the face (sinuses). Growths in the nose, such as polyps. Blood thinners or conditions that cause blood to clot slowly. Certain illnesses or procedures that irritate or dry out the nasal passages. Follow these instructions at home: When you have a nosebleed:  Sit down and tilt your head slightly forward. Use a clean towel or tissue to pinch your nostrils under the bony part of your nose. After 5 minutes, let go of your nose and see if bleeding starts again. Do not release pressure before that time. If there is still bleeding, repeat the pinching and holding for 5 minutes or until the bleeding stops. Do not place tissues or gauze in the nose to stop the bleeding. Avoid lying down and avoid tilting your head backward. That may make blood collect in the throat and cause gagging or coughing. Use a nasal spray decongestant to help with a nosebleed as told by your health care provider. After a nosebleed: Avoid blowing your nose or sniffing for a number of hours. Avoid straining, lifting, or bending at the waist for several days. You may go back to other normal activities as you are able. If you are taking aspirin or blood thinners and you have nosebleeds, talk to your health care provider. These medicines make bleeding more likely. Ask your health care provider if you should stop taking the medicines or if you should  adjust the dose. Do not stop taking medicines that your health care provider has recommended unless he or she tells you to stop taking them. If your nosebleed was caused by dry mucous membranes, use over-the-counter saline nasal spray or gel and a humidifier as told by your health care provider. This will keep the mucous membranes moist and allow them to heal. If you need to use one of these products: Choose one that is water-soluble. Use only as much as you need and use it only as often as needed. Do not lie down right after you use it. If you get nosebleeds often, talk with your health care provider about medical treatments. Options may include: Nasal cautery. This treatment stops and prevents nosebleeds by using a chemical swab or electrical device to lightly burn tiny blood vessels inside the nose. Nasal packing. A gauze or other material is placed in the nose to keep constant pressure on the bleeding area. Contact a health care provider if you: Have a fever. Get nosebleeds often or more often than usual. Bruise very easily. Have a nosebleed from having something stuck in your nose. Have bleeding in your mouth. Vomit or cough up brown material. Have a nosebleed after you start a new medicine. Get help right away if: You have a nosebleed after a fall or a head injury. Your nosebleed does not go away after 20 minutes. You feel dizzy or weak. You have unusual bleeding from other parts of your body. You have unusual bruising on   other parts of your body. You become sweaty. You vomit blood. Summary A nosebleed is when blood comes out of the nose. Common causes include allergies, an injury to the nose, or cold or dry air. Initial treatment includes applying pressure for 5 minutes. Moisturizing the nose with saline nasal spray or gel after a nosebleed may help prevent future bleeding. Get help right away if your nosebleed does not go away after 20 minutes. This information is not intended  to replace advice given to you by your health care provider. Make sure you discuss any questions you have with your health care provider. Document Revised: 10/08/2019 Document Reviewed: 10/08/2019 Elsevier Patient Education  2022 [employer].  

## 2023-09-24 NOTE — Telephone Encounter (Signed)
Called pt since her INR is due and inquired if she has gone to have it checked. She states she forgot it was due today and will call to get scheduled at the lab. Will follow up.

## 2023-09-25 ENCOUNTER — Encounter: Payer: Self-pay | Admitting: Registered Nurse

## 2023-09-25 ENCOUNTER — Ambulatory Visit (INDEPENDENT_AMBULATORY_CARE_PROVIDER_SITE_OTHER): Payer: No Typology Code available for payment source | Admitting: *Deleted

## 2023-09-25 DIAGNOSIS — Z5181 Encounter for therapeutic drug level monitoring: Secondary | ICD-10-CM | POA: Diagnosis not present

## 2023-09-25 DIAGNOSIS — Z7901 Long term (current) use of anticoagulants: Secondary | ICD-10-CM | POA: Diagnosis not present

## 2023-09-25 DIAGNOSIS — Z952 Presence of prosthetic heart valve: Secondary | ICD-10-CM | POA: Diagnosis not present

## 2023-09-25 LAB — PROTIME-INR
INR: 2.3 — ABNORMAL HIGH (ref 0.9–1.2)
Prothrombin Time: 24.5 s — ABNORMAL HIGH (ref 9.1–12.0)

## 2023-09-25 NOTE — Patient Instructions (Signed)
Description   Spoke with pt and instructed her to continue taking Warfarin 1.5 tablets daily except for 1 tablet on Monday and Thursdays.  Stay consistent with greens each week. Recheck INR in 4 weeks at lab. Coumadin Clinic [phone]

## 2023-10-07 ENCOUNTER — Other Ambulatory Visit: Payer: No Typology Code available for payment source

## 2023-10-22 ENCOUNTER — Encounter: Payer: Self-pay | Admitting: Registered Nurse

## 2023-10-22 ENCOUNTER — Other Ambulatory Visit: Payer: No Typology Code available for payment source | Admitting: Registered Nurse

## 2023-10-22 ENCOUNTER — Telehealth: Payer: Self-pay | Admitting: *Deleted

## 2023-10-22 ENCOUNTER — Other Ambulatory Visit: Payer: Self-pay | Admitting: Registered Nurse

## 2023-10-22 DIAGNOSIS — Z7901 Long term (current) use of anticoagulants: Secondary | ICD-10-CM

## 2023-10-22 DIAGNOSIS — Z952 Presence of prosthetic heart valve: Secondary | ICD-10-CM

## 2023-10-22 LAB — PROTIME-INR: INR: 1.6 — AB (ref 0.80–1.20)

## 2023-10-22 NOTE — Progress Notes (Signed)
61y/o established caucasian female here for PT/INR draw for anticoagulation provider Dr Herbie Baltimore.  Venipuncture x 1 left AC.  Blue top tube sent to [facility].  Gauze and cobain dressing applied to venipuncture site discussed to remove in 15 minutes.  Dressing clean dry and intact on discharge ambulatory from clinic.  Gait sure and steady A&Ox3 respirations even and unlabored RA skin warm dry and pink.

## 2023-10-22 NOTE — Telephone Encounter (Signed)
Spoke with pt to see if she went and had INR done and she states she went later in the day. Labs may result tomorrow per pt. Will follow up once received.

## 2023-10-23 ENCOUNTER — Ambulatory Visit (INDEPENDENT_AMBULATORY_CARE_PROVIDER_SITE_OTHER): Payer: No Typology Code available for payment source | Admitting: *Deleted

## 2023-10-23 DIAGNOSIS — Z7901 Long term (current) use of anticoagulants: Secondary | ICD-10-CM

## 2023-10-23 DIAGNOSIS — Z952 Presence of prosthetic heart valve: Secondary | ICD-10-CM

## 2023-10-23 DIAGNOSIS — Z5181 Encounter for therapeutic drug level monitoring: Secondary | ICD-10-CM | POA: Diagnosis not present

## 2023-10-23 LAB — PROTIME-INR
INR: 1.6 — ABNORMAL HIGH (ref 0.9–1.2)
Prothrombin Time: 17.1 s — ABNORMAL HIGH (ref 9.1–12.0)

## 2023-10-23 NOTE — Patient Instructions (Signed)
Description   Spoke with pt and instructed her to take 2 tablets of warfarin today then continue taking Warfarin 1.5 tablets daily except for 1 tablet on Monday and Thursdays.  Stay consistent with greens each week.  Recheck INR in 3 weeks at [facility] [phone]

## 2023-10-30 NOTE — Progress Notes (Signed)
See follow up PT/INR results note draw 10/22/23

## 2023-11-12 ENCOUNTER — Other Ambulatory Visit: Payer: No Typology Code available for payment source | Admitting: Registered Nurse

## 2023-11-12 ENCOUNTER — Other Ambulatory Visit: Payer: Self-pay | Admitting: Registered Nurse

## 2023-11-12 ENCOUNTER — Encounter: Payer: Self-pay | Admitting: Registered Nurse

## 2023-11-12 DIAGNOSIS — Z952 Presence of prosthetic heart valve: Secondary | ICD-10-CM

## 2023-11-12 DIAGNOSIS — Z7901 Long term (current) use of anticoagulants: Secondary | ICD-10-CM

## 2023-11-12 NOTE — Progress Notes (Signed)
Left ac venipuncture x 1 PT INR sent to [facility] via courier.  Dressing applied CDI on discharge ambulatory from clinic A&Ox3 gait sure and steady respirations even and unlabored RA patient will follow up with anticoagulation clinic tomorrow for results and further instructions.  Lab draw only [facility] today.

## 2023-11-13 ENCOUNTER — Ambulatory Visit (INDEPENDENT_AMBULATORY_CARE_PROVIDER_SITE_OTHER): Payer: No Typology Code available for payment source | Admitting: *Deleted

## 2023-11-13 DIAGNOSIS — Z952 Presence of prosthetic heart valve: Secondary | ICD-10-CM

## 2023-11-13 DIAGNOSIS — Z5181 Encounter for therapeutic drug level monitoring: Secondary | ICD-10-CM | POA: Diagnosis not present

## 2023-11-13 DIAGNOSIS — Z7901 Long term (current) use of anticoagulants: Secondary | ICD-10-CM | POA: Diagnosis not present

## 2023-11-13 LAB — PROTIME-INR
INR: 2 — ABNORMAL HIGH (ref 0.9–1.2)
Prothrombin Time: 20.9 s — ABNORMAL HIGH (ref 9.1–12.0)

## 2023-11-13 NOTE — Patient Instructions (Signed)
Description   Spoke with pt and instructed her to take 2 tablets of warfarin today then continue taking Warfarin 1.5 tablets daily except for 1 tablet on Monday and Thursdays.  Stay consistent with greens each week.  Recheck INR in 4 weeks at [facility] [phone]

## 2023-12-04 ENCOUNTER — Other Ambulatory Visit: Payer: No Typology Code available for payment source

## 2023-12-10 ENCOUNTER — Encounter: Payer: Self-pay | Admitting: Registered Nurse

## 2023-12-10 ENCOUNTER — Other Ambulatory Visit: Payer: No Typology Code available for payment source | Admitting: Registered Nurse

## 2023-12-10 ENCOUNTER — Other Ambulatory Visit: Payer: Self-pay | Admitting: Registered Nurse

## 2023-12-10 ENCOUNTER — Telehealth: Payer: Self-pay | Admitting: *Deleted

## 2023-12-10 DIAGNOSIS — Z952 Presence of prosthetic heart valve: Secondary | ICD-10-CM

## 2023-12-10 DIAGNOSIS — R7303 Prediabetes: Secondary | ICD-10-CM

## 2023-12-10 DIAGNOSIS — Z7901 Long term (current) use of anticoagulants: Secondary | ICD-10-CM

## 2023-12-10 NOTE — Progress Notes (Signed)
62y/o established female here for PT/INR draw.  Discussed with patient epic reminded me also due to Hgba1c recheck prediabetes worsening on last check.  Patient stated has been active and added low sugar oatmeal to diet 2 months ago also.  Will recheck 3 month blood sugar level today also.  Venipuncture x 1 left AC cobain and 2x2 gauze applied to site CDI on ambulatory discharge and instructed to remove in 15 minutes.  Discussed results will be in my chart tomorrow.  Sample sent to [facility] via courier today.  Patient verbalized understanding information/instructions, agreed with plan of care and had no further questions at this time.

## 2023-12-10 NOTE — Telephone Encounter (Signed)
Called pt since INR is due; there was no answer and no voicemail set up. Will try back at later time/date.

## 2023-12-11 ENCOUNTER — Encounter: Payer: Self-pay | Admitting: Registered Nurse

## 2023-12-11 ENCOUNTER — Other Ambulatory Visit: Payer: Self-pay | Admitting: Registered Nurse

## 2023-12-11 DIAGNOSIS — R7303 Prediabetes: Secondary | ICD-10-CM

## 2023-12-11 LAB — PROTIME-INR

## 2023-12-11 LAB — HGB A1C W/O EAG: Hgb A1c MFr Bld: 6 % — ABNORMAL HIGH (ref 4.8–5.6)

## 2023-12-12 ENCOUNTER — Other Ambulatory Visit: Payer: No Typology Code available for payment source

## 2023-12-12 ENCOUNTER — Other Ambulatory Visit: Payer: Self-pay | Admitting: Registered Nurse

## 2023-12-12 ENCOUNTER — Telehealth: Payer: Self-pay | Admitting: *Deleted

## 2023-12-12 NOTE — Progress Notes (Signed)
EE in clinic this morning to have her monthly INR drawn.  Blood drawn and EE tolerated procedure well.  Reece Packer, RN, BSN, MPH, [facility]

## 2023-12-12 NOTE — Telephone Encounter (Signed)
Spoke with pt and inquired if labs were drawn. She confirmed labs were drawn on 12/17 and then was told that there was not enough blood in the tube so had to have it redrawn on today. Will monitor for lab results since this is a delay in labs.

## 2023-12-13 ENCOUNTER — Ambulatory Visit (INDEPENDENT_AMBULATORY_CARE_PROVIDER_SITE_OTHER): Payer: No Typology Code available for payment source | Admitting: *Deleted

## 2023-12-13 DIAGNOSIS — Z7901 Long term (current) use of anticoagulants: Secondary | ICD-10-CM | POA: Diagnosis not present

## 2023-12-13 DIAGNOSIS — Z952 Presence of prosthetic heart valve: Secondary | ICD-10-CM

## 2023-12-13 DIAGNOSIS — Z5181 Encounter for therapeutic drug level monitoring: Secondary | ICD-10-CM | POA: Diagnosis not present

## 2023-12-13 LAB — PROTIME-INR
INR: 2.5 — ABNORMAL HIGH (ref 0.9–1.2)
Prothrombin Time: 25.8 s — ABNORMAL HIGH (ref 9.1–12.0)

## 2023-12-13 NOTE — Addendum Note (Signed)
Addended by: Albina Billet A on: 12/13/2023 08:38 AM   Modules accepted: Orders, Level of Service

## 2023-12-13 NOTE — Patient Instructions (Signed)
Description   Spoke with pt and instructed her to take 2 tablets of warfarin today then continue taking Warfarin 1.5 tablets daily except for 1 tablet on Monday and Thursdays.  Stay consistent with greens each week.  Recheck INR in 5 weeks at [facility] [phone]

## 2024-01-01 ENCOUNTER — Other Ambulatory Visit: Payer: Self-pay | Admitting: Cardiology

## 2024-01-01 DIAGNOSIS — Z7901 Long term (current) use of anticoagulants: Secondary | ICD-10-CM

## 2024-01-01 NOTE — Telephone Encounter (Signed)
 Warfarin 5mg  refill S/p AVR Last INR 12/13/23 Last OV 03/15/23

## 2024-01-07 ENCOUNTER — Other Ambulatory Visit: Payer: Self-pay | Admitting: Internal Medicine

## 2024-01-07 DIAGNOSIS — Z1231 Encounter for screening mammogram for malignant neoplasm of breast: Secondary | ICD-10-CM

## 2024-01-15 ENCOUNTER — Other Ambulatory Visit: Payer: No Typology Code available for payment source

## 2024-01-15 ENCOUNTER — Telehealth: Payer: Self-pay | Admitting: *Deleted

## 2024-01-15 ENCOUNTER — Other Ambulatory Visit: Payer: Self-pay | Admitting: Registered Nurse

## 2024-01-15 DIAGNOSIS — Z7901 Long term (current) use of anticoagulants: Secondary | ICD-10-CM

## 2024-01-15 NOTE — Telephone Encounter (Signed)
Pt confirmed that she went and had INR drawn today. Will await results.

## 2024-01-16 ENCOUNTER — Encounter: Payer: Self-pay | Admitting: Registered Nurse

## 2024-01-16 ENCOUNTER — Ambulatory Visit (INDEPENDENT_AMBULATORY_CARE_PROVIDER_SITE_OTHER): Payer: No Typology Code available for payment source

## 2024-01-16 DIAGNOSIS — Z5181 Encounter for therapeutic drug level monitoring: Secondary | ICD-10-CM | POA: Diagnosis not present

## 2024-01-16 LAB — PROTIME-INR
INR: 3 — ABNORMAL HIGH (ref 0.9–1.2)
Prothrombin Time: 30.9 s — ABNORMAL HIGH (ref 9.1–12.0)

## 2024-01-16 NOTE — Patient Instructions (Signed)
Description   Spoke with pt and instructed her to continue taking Warfarin 1.5 tablets daily except for 1 tablet on Monday and Thursdays.  Stay consistent with greens each week.  Recheck INR in 6 weeks at [facility] [phone]

## 2024-01-21 ENCOUNTER — Ambulatory Visit
Admission: RE | Admit: 2024-01-21 | Discharge: 2024-01-21 | Disposition: A | Discharge status: Home / Self Care | Payer: No Typology Code available for payment source | Source: Ambulatory Visit | Attending: Internal Medicine | Admitting: Internal Medicine

## 2024-01-21 DIAGNOSIS — Z1231 Encounter for screening mammogram for malignant neoplasm of breast: Secondary | ICD-10-CM

## 2024-01-31 ENCOUNTER — Ambulatory Visit (HOSPITAL_COMMUNITY): Payer: No Typology Code available for payment source | Attending: Cardiology

## 2024-01-31 DIAGNOSIS — Z952 Presence of prosthetic heart valve: Secondary | ICD-10-CM | POA: Insufficient documentation

## 2024-01-31 LAB — ECHOCARDIOGRAM COMPLETE
AV Mean grad: 26 mm[Hg]
AV Peak grad: 40.4 mm[Hg]
Ao pk vel: 3.18 m/s
Area-P 1/2: 4.44 cm2
S' Lateral: 3 cm

## 2024-02-08 ENCOUNTER — Encounter: Payer: Self-pay | Admitting: Cardiology

## 2024-02-26 ENCOUNTER — Other Ambulatory Visit: Payer: No Typology Code available for payment source

## 2024-02-26 ENCOUNTER — Other Ambulatory Visit: Payer: Self-pay | Admitting: Registered Nurse

## 2024-02-26 ENCOUNTER — Other Ambulatory Visit: Admitting: Registered Nurse

## 2024-02-26 DIAGNOSIS — Z7901 Long term (current) use of anticoagulants: Secondary | ICD-10-CM

## 2024-02-26 LAB — PROTIME-INR: INR: 1.6 — AB (ref 0.80–1.20)

## 2024-02-27 ENCOUNTER — Encounter: Payer: Self-pay | Admitting: Registered Nurse

## 2024-02-27 ENCOUNTER — Ambulatory Visit (INDEPENDENT_AMBULATORY_CARE_PROVIDER_SITE_OTHER): Payer: Self-pay

## 2024-02-27 ENCOUNTER — Telehealth: Payer: Self-pay | Admitting: Registered Nurse

## 2024-02-27 DIAGNOSIS — Z5181 Encounter for therapeutic drug level monitoring: Secondary | ICD-10-CM | POA: Diagnosis not present

## 2024-02-27 DIAGNOSIS — Z7901 Long term (current) use of anticoagulants: Secondary | ICD-10-CM

## 2024-02-27 DIAGNOSIS — Z952 Presence of prosthetic heart valve: Secondary | ICD-10-CM

## 2024-02-27 LAB — PROTIME-INR
INR: 1.6 — ABNORMAL HIGH (ref 0.9–1.2)
Prothrombin Time: 17.1 s — ABNORMAL HIGH (ref 9.1–12.0)

## 2024-02-27 NOTE — Telephone Encounter (Signed)
 INR  1.6                Reference interval is for non-anticoagulated patients.                                                                     .                Suggested INR therapeutic range for Vitamin K                antagonist therapy:                   Standard Dose (moderate intensity                                  therapeutic range):       2.0 - 3.0                   Higher intensity therapeutic range       2.5 - 3.5 High  0.9 - 1.2 Select row Prothrombin Time  17.1 High sec 9.1 - 12.0 From [facility] portal drawn 02/26/2024  Copies of results sent to anticoagulation clinic provider/RN Sigurd Sos  Per anticoagulation clinic note Spoke with pt and instructed her to take 2 tablets today and then continue taking Warfarin 1.5 tablets daily except for 1 tablet on Monday and Thursdays.  Stay consistent with greens each week.  Recheck INR in 3 weeks at [facility] [phone]   Patient contacted via telephone verified new dosing instructions per anticoagulation clinic and appt scheduled 26 Mar at 0900 for next lab draw PT/INR

## 2024-02-27 NOTE — Patient Instructions (Signed)
 Description   Spoke with pt and instructed her to take 2 tablets today and then continue taking Warfarin 1.5 tablets daily except for 1 tablet on Monday and Thursdays.  Stay consistent with greens each week.  Recheck INR in 3 weeks at [facility] [phone]

## 2024-02-28 ENCOUNTER — Encounter: Payer: Self-pay | Admitting: Registered Nurse

## 2024-03-13 ENCOUNTER — Ambulatory Visit: Payer: No Typology Code available for payment source | Attending: Cardiology | Admitting: Cardiology

## 2024-03-13 ENCOUNTER — Encounter: Payer: Self-pay | Admitting: Cardiology

## 2024-03-13 VITALS — BP 100/60 | HR 57 | Ht 60.5 in | Wt 120.0 lb

## 2024-03-13 DIAGNOSIS — I10 Essential (primary) hypertension: Secondary | ICD-10-CM | POA: Diagnosis not present

## 2024-03-13 DIAGNOSIS — Z7901 Long term (current) use of anticoagulants: Secondary | ICD-10-CM

## 2024-03-13 DIAGNOSIS — E785 Hyperlipidemia, unspecified: Secondary | ICD-10-CM | POA: Diagnosis not present

## 2024-03-13 DIAGNOSIS — Z952 Presence of prosthetic heart valve: Secondary | ICD-10-CM | POA: Diagnosis not present

## 2024-03-13 NOTE — Patient Instructions (Signed)
 Medication Instructions:   No  changes *If you need a refill on your cardiac medications before your next appointment, please call your pharmacy*   Lab Work: Not needed    Testing/Procedures:  Not needed  Follow-Up: At [facility], you and your health needs are our priority.  As part of our continuing mission to provide you with exceptional heart care, we have created designated Provider Care Teams.  These Care Teams include your primary Cardiologist (physician) and Advanced Practice Providers (APPs -  Physician Assistants and Nurse Practitioners) who all work together to provide you with the care you need, when you need it.     Your next appointment:   12 month(s)  The format for your next appointment:   In Person  Provider:    Marjie Skiff, PA-C, Bernadene Person, NP, or Reather Littler, NP    Then, Bryan Lemma, MD will plan to see you again in 24 month .   Other Instructions   s

## 2024-03-13 NOTE — Progress Notes (Signed)
 Cardiology Office Note:  .   Date:  03/15/2024  ID:  Victoria [name], DOB Jan 19, 1961, MRN [id number] PCP: Lorre Munroe, NP  [facility] Providers Cardiologist:  Bryan Lemma, MD     Chief Complaint  Patient presents with   Follow-up    Doing well - no issues   Cardiac Valve Problem    S/p AVR   Patient Profile: .     Victoria [name] is a 63 y.o. 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    Victoria [name] was last seen on March 15, 2023 for regular follow-up.  Doing well from a cardiac standpoint.  She was somewhat upset because her mother just been placed on hospice 1 month prior to that when her PCP noted dysthymia &  anxiety.  No cardiac symptoms.  Some mild musculoskeletal chest pains.  Mostly no knee and back pain.Has lost 5 pounds   INTERVAL HISTORY Discussed the use of AI scribe software for clinical note transcription with the patient, who gave verbal consent to proceed. History of Present Illness   Victoria [name] is a 63 year old female w/ h/o AVR for Congenital bicuspid Aortic Valve with Severe AS who presents for a routine cardiology follow-up.  She is 14 years post-heart AVR in 2011. Since the operation, she has not experienced any major issues. An echocardiogram shows a stable increased gradient across the valve, which has gradually increased from 19 to 26 over the years.  She is on warfarin for anticoagulation management with a dosing schedule of 1.5 tablets daily except on Mondays and Thursdays. No blood in stools or urine has been noted. She experienced epistaxis a couple of months ago, which was cauterized, and has not had any since. She was prescribed Vicodin for the epistaxis but has not been using it.  Her current  medications include warfarin, amlodipine 10 mg daily (split into two doses - taking 5 mg BID or if the pharmacy has 10 mg tabs will take 10 mg daily), carvedilol 3.125 mg twice daily, and atorvastatin 40 mg daily.  No recent illnesses, fever, chills, cold sweats, chest pain, pressure, tightness, swelling in her legs, lightheadedness, dizziness, or stroke-like symptoms.  She is active and has joined a tennis team, indicating good physical activity levels. She participates in wellness checks at work, including cholesterol monitoring, with the last check in June showing an LDL of 100 mg/dL.   - History of nosebleed (February) - treated with cauterization       Objective   Medications - Warfarin 1.5 every day except for Mondays and Thursdays - Amlodipine 5 mg twice a day - Carvedilol 3.125 mg twice a day - Lipitor 40 mg daily - Coumadin 1 tablet every day except for Mondays and Thursdays - Antibiotics (previous) PRN for SBE prophylaxis  Studies Reviewed: Marland Kitchen   EKG Interpretation Date/Time:  Friday March 13 2024 09:18:24 EDT Ventricular Rate:  57 PR Interval:  110 QRS Duration:  88 QT Interval:  418 QTC Calculation: 406 R Axis:   6  Text Interpretation: Sinus bradycardia with short PR When compared with ECG of 24-Nov-2021 14:55, Vent. rate has decreased BY  29 BPM Nonspecific T wave abnormality no longer evident in Inferior leads Nonspecific T  wave abnormality no longer evident in Lateral leads Confirmed by Bryan Lemma ([id number]) on 03/13/2024 9:39:12 AM   ECHO January 31, 2024: Normal LV size and function.  EF 55 to 60%.  "Normal diastolic parameters ".  Mild LA dilation.  Moderate MAC with trivial MR.  Saint Jude mechanical AV prosthesis functioning well with a mean AVG of 26 mmHg.  Elevated, similar to prior study.  Trivial AI.  Normal RAP.  Lab Results  Component Value Date   CHOL 191 06/04/2023   HDL 70 06/04/2023   LDLCALC 100 (H) 06/04/2023   TRIG 121 06/04/2023   CHOLHDL 2.7  06/04/2023   Lab Results  Component Value Date   NA 136 06/04/2023   K 4.7 06/04/2023   CREATININE 0.63 06/04/2023   EGFR 101 06/04/2023   GLUCOSE 110 (H) 06/04/2023    Risk Assessment/Calculations:           Physical Exam:   VS:  BP 100/60   Pulse (!) 57   Ht 5' 0.5" (1.537 m)   Wt 120 lb (54.4 kg)   SpO2 97%   BMI 23.05 kg/m    Wt Readings from Last 3 Encounters:  03/13/24 120 lb (54.4 kg)  09/09/23 116 lb (52.6 kg)  09/04/23 117 lb (53.1 kg)    GEN: Well nourished, well groomed in no acute distress; healthy appearing NECK: No JVD; No carotid bruits CARDIAC: Normal S1; RRR, no rubs, gallops; 3/6 SEM harsh mechanical S2 RESPIRATORY:  Clear to auscultation without rales, wheezing or rhonchi ; nonlabored, good air movement. ABDOMEN: Soft, non-tender, non-distended EXTREMITIES:  No edema; No deformity      ASSESSMENT AND PLAN: .    Problem List Items Addressed This Visit       Cardiology Problems   Essential hypertension (Chronic)   Blood pressure borderline low. On amlodipine and carvedilol. - Continue amlodipine 10 mg daily, split into two doses. - Continue carvedilol 3.125 mg twice daily. - Advise to stay hydrated.      Relevant Orders   EKG 12-Lead (Completed)   Hyperlipidemia with target LDL less than 100 (Chronic)   LDL cholesterol at 100 mg/dL. On Lipitor 40 mg daily. - Continue Lipitor 40 mg daily.      Relevant Orders   EKG 12-Lead (Completed)     Other   Chronic anticoagulation (Chronic)   On warfarin with stable dosing schedule. No major bleeding issues except for epistaxis in February. - Continue current warfarin dosing schedule. - Okay to hold warfarin for procedures, but would need to be managed by warfarin clinic and consider Lovenox bridging - Monitor for any signs of bleeding.      Relevant Orders   EKG 12-Lead (Completed)   S/P AVR - Primary (Chronic)   Echocardiogram shows increased gradient from 19 to 26, valve function remains  uncompromised. Monitoring required due to gradient increase. - Order echocardiogram in 2025 to monitor valve gradient. - Ensure insurance coverage for echocardiogram. - Consider adjusting frequency of echocardiograms based on future stability of valve gradient. - Discussed the importance of SBE prophylaxis for GI procedures or deep dental cleaning      Relevant Orders   EKG 12-Lead (Completed)       Follow-Up: Return in about 1 year (around 03/13/2025) for Routine follow up with me, [employer].     Signed, [name], MD, MS Bryan Lemma, M.D., M.S. Interventional Cardiologist  [facility]  Pager # [phone] Phone # [phone] [address] [place], [place] [address]

## 2024-03-15 ENCOUNTER — Encounter: Payer: Self-pay | Admitting: Cardiology

## 2024-03-15 NOTE — Assessment & Plan Note (Signed)
 Blood pressure borderline low. On amlodipine and carvedilol. - Continue amlodipine 10 mg daily, split into two doses. - Continue carvedilol 3.125 mg twice daily. - Advise to stay hydrated.

## 2024-03-15 NOTE — Assessment & Plan Note (Signed)
 On warfarin with stable dosing schedule. No major bleeding issues except for epistaxis in February. - Continue current warfarin dosing schedule. - Okay to hold warfarin for procedures, but would need to be managed by warfarin clinic and consider Lovenox bridging - Monitor for any signs of bleeding.

## 2024-03-15 NOTE — Assessment & Plan Note (Signed)
 LDL cholesterol at 100 mg/dL. On Lipitor 40 mg daily. - Continue Lipitor 40 mg daily.

## 2024-03-15 NOTE — Assessment & Plan Note (Signed)
 Echocardiogram shows increased gradient from 19 to 26, valve function remains uncompromised. Monitoring required due to gradient increase. - Order echocardiogram in 2025 to monitor valve gradient. - Ensure insurance coverage for echocardiogram. - Consider adjusting frequency of echocardiograms based on future stability of valve gradient. - Discussed the importance of SBE prophylaxis for GI procedures or deep dental cleaning

## 2024-03-18 ENCOUNTER — Other Ambulatory Visit

## 2024-03-19 ENCOUNTER — Other Ambulatory Visit

## 2024-03-19 DIAGNOSIS — Z7901 Long term (current) use of anticoagulants: Secondary | ICD-10-CM

## 2024-03-19 NOTE — Progress Notes (Unsigned)
 In for lab draw PT/INR

## 2024-03-20 ENCOUNTER — Encounter: Payer: Self-pay | Admitting: Registered Nurse

## 2024-03-20 ENCOUNTER — Ambulatory Visit (INDEPENDENT_AMBULATORY_CARE_PROVIDER_SITE_OTHER): Admitting: *Deleted

## 2024-03-20 DIAGNOSIS — Z5181 Encounter for therapeutic drug level monitoring: Secondary | ICD-10-CM

## 2024-03-20 DIAGNOSIS — Z952 Presence of prosthetic heart valve: Secondary | ICD-10-CM

## 2024-03-20 DIAGNOSIS — Z7901 Long term (current) use of anticoagulants: Secondary | ICD-10-CM

## 2024-03-20 LAB — PROTIME-INR
INR: 2.5 — ABNORMAL HIGH (ref 0.9–1.2)
Prothrombin Time: 26.1 s — ABNORMAL HIGH (ref 9.1–12.0)

## 2024-03-20 NOTE — Patient Instructions (Signed)
 Description   Spoke with pt and instructed her to continue taking Warfarin 1.5 tablets daily except for 1 tablet on Monday and Thursdays.  Stay consistent with greens each week.  Recheck INR in 4 weeks at [facility] [phone]

## 2024-04-09 ENCOUNTER — Other Ambulatory Visit

## 2024-04-16 ENCOUNTER — Other Ambulatory Visit

## 2024-04-20 ENCOUNTER — Other Ambulatory Visit

## 2024-04-20 ENCOUNTER — Telehealth: Payer: Self-pay | Admitting: *Deleted

## 2024-04-20 VITALS — BP 121/62 | Ht 60.0 in | Wt 119.0 lb

## 2024-04-20 DIAGNOSIS — Z0000 Encounter for general adult medical examination without abnormal findings: Secondary | ICD-10-CM

## 2024-04-20 DIAGNOSIS — R7303 Prediabetes: Secondary | ICD-10-CM

## 2024-04-20 NOTE — Telephone Encounter (Signed)
 Called pt since INR is due; there was no answer so left her a message.

## 2024-04-20 NOTE — Progress Notes (Signed)
 [facility] labs and PT INR

## 2024-04-21 ENCOUNTER — Encounter: Payer: Self-pay | Admitting: Registered Nurse

## 2024-04-21 ENCOUNTER — Ambulatory Visit: Admitting: Registered Nurse

## 2024-04-21 ENCOUNTER — Ambulatory Visit (INDEPENDENT_AMBULATORY_CARE_PROVIDER_SITE_OTHER): Payer: Self-pay | Admitting: *Deleted

## 2024-04-21 DIAGNOSIS — Z5181 Encounter for therapeutic drug level monitoring: Secondary | ICD-10-CM

## 2024-04-21 DIAGNOSIS — Z7901 Long term (current) use of anticoagulants: Secondary | ICD-10-CM | POA: Diagnosis not present

## 2024-04-21 DIAGNOSIS — Z952 Presence of prosthetic heart valve: Secondary | ICD-10-CM | POA: Diagnosis not present

## 2024-04-21 LAB — CMP12+LP+TP+TSH+6AC+CBC/D/PLT
ALT: 22 IU/L (ref 0–32)
AST: 32 IU/L (ref 0–40)
Albumin: 4.5 g/dL (ref 3.9–4.9)
Alkaline Phosphatase: 103 IU/L (ref 44–121)
BUN/Creatinine Ratio: 26 (ref 12–28)
BUN: 14 mg/dL (ref 8–27)
Basophils Absolute: 0 10*3/uL (ref 0.0–0.2)
Basos: 1 %
Bilirubin Total: 1.5 mg/dL — ABNORMAL HIGH (ref 0.0–1.2)
Calcium: 9.3 mg/dL (ref 8.7–10.3)
Chloride: 104 mmol/L (ref 96–106)
Chol/HDL Ratio: 2.5 ratio (ref 0.0–4.4)
Cholesterol, Total: 177 mg/dL (ref 100–199)
Creatinine, Ser: 0.54 mg/dL — ABNORMAL LOW (ref 0.57–1.00)
EOS (ABSOLUTE): 0.1 10*3/uL (ref 0.0–0.4)
Eos: 2 %
Estimated CHD Risk: <0.5 times avg. (ref 0.0–1.0)
Free Thyroxine Index: 1.8 (ref 1.2–4.9)
GGT: 17 IU/L (ref 0–60)
Globulin, Total: 2.6 g/dL (ref 1.5–4.5)
Glucose: 120 mg/dL — ABNORMAL HIGH (ref 70–99)
HDL: 71 mg/dL (ref >39)
Hematocrit: 40.2 % (ref 34.0–46.6)
Hemoglobin: 13.2 g/dL (ref 11.1–15.9)
Immature Grans (Abs): 0 10*3/uL (ref 0.0–0.1)
Immature Granulocytes: 0 %
Iron: 87 ug/dL (ref 27–139)
LDH: 346 IU/L — ABNORMAL HIGH (ref 119–226)
LDL Chol Calc (NIH): 88 mg/dL (ref 0–99)
Lymphocytes Absolute: 1 10*3/uL (ref 0.7–3.1)
Lymphs: 17 %
MCH: 31.1 pg (ref 26.6–33.0)
MCHC: 32.8 g/dL (ref 31.5–35.7)
MCV: 95 fL (ref 79–97)
Monocytes Absolute: 0.6 10*3/uL (ref 0.1–0.9)
Monocytes: 10 %
Neutrophils Absolute: 4 10*3/uL (ref 1.4–7.0)
Neutrophils: 70 %
Phosphorus: 3.3 mg/dL (ref 3.0–4.3)
Potassium: 4.5 mmol/L (ref 3.5–5.2)
RBC: 4.24 x10E6/uL (ref 3.77–5.28)
RDW: 12.7 % (ref 11.7–15.4)
Sodium: 139 mmol/L (ref 134–144)
T3 Uptake Ratio: 25 % (ref 24–39)
T4, Total: 7 ug/dL (ref 4.5–12.0)
TSH: 1.88 u[IU]/mL (ref 0.450–4.500)
Total Protein: 7.1 g/dL (ref 6.0–8.5)
Triglycerides: 103 mg/dL (ref 0–149)
Uric Acid: 5.8 mg/dL (ref 3.0–7.2)
VLDL Cholesterol Cal: 18 mg/dL (ref 5–40)
WBC: 5.8 10*3/uL (ref 3.4–10.8)
eGFR: 105 mL/min/{1.73_m2} (ref >59)

## 2024-04-21 LAB — HGB A1C W/O EAG: Hgb A1c MFr Bld: 5.9 % — ABNORMAL HIGH (ref 4.8–5.6)

## 2024-04-21 LAB — PROTIME-INR
INR: 3 — ABNORMAL HIGH (ref 0.9–1.2)
Prothrombin Time: 30.5 s — ABNORMAL HIGH (ref 9.1–12.0)

## 2024-04-21 NOTE — Patient Instructions (Signed)
 Description   Spoke with pt and instructed her to continue taking Warfarin 1.5 tablets daily except for 1 tablet on Monday and Thursdays.  Stay consistent with greens each week.  Recheck INR in 4 weeks at [facility] [phone]

## 2024-04-21 NOTE — Addendum Note (Signed)
 Addended by: Sherilyn Dings A on: 04/21/2024 03:12 PM   Modules accepted: Orders, Level of Service

## 2024-05-05 ENCOUNTER — Ambulatory Visit: Payer: Self-pay | Admitting: Registered Nurse

## 2024-05-05 DIAGNOSIS — Z7901 Long term (current) use of anticoagulants: Secondary | ICD-10-CM

## 2024-05-19 ENCOUNTER — Other Ambulatory Visit

## 2024-05-19 ENCOUNTER — Telehealth: Payer: Self-pay | Admitting: Registered Nurse

## 2024-05-19 ENCOUNTER — Telehealth: Payer: Self-pay

## 2024-05-19 VITALS — BP 105/62

## 2024-05-19 DIAGNOSIS — Z7901 Long term (current) use of anticoagulants: Secondary | ICD-10-CM

## 2024-05-19 DIAGNOSIS — Z952 Presence of prosthetic heart valve: Secondary | ICD-10-CM

## 2024-05-19 DIAGNOSIS — Z5181 Encounter for therapeutic drug level monitoring: Secondary | ICD-10-CM

## 2024-05-19 NOTE — Telephone Encounter (Signed)
Reminded patient to have INR drawn.

## 2024-05-19 NOTE — Progress Notes (Signed)
INR lab draw

## 2024-05-20 ENCOUNTER — Ambulatory Visit (INDEPENDENT_AMBULATORY_CARE_PROVIDER_SITE_OTHER): Payer: Self-pay | Admitting: Cardiology

## 2024-05-20 ENCOUNTER — Ambulatory Visit: Payer: Self-pay | Admitting: Registered Nurse

## 2024-05-20 DIAGNOSIS — Z7901 Long term (current) use of anticoagulants: Secondary | ICD-10-CM

## 2024-05-20 DIAGNOSIS — Z5181 Encounter for therapeutic drug level monitoring: Secondary | ICD-10-CM | POA: Diagnosis not present

## 2024-05-20 DIAGNOSIS — Z952 Presence of prosthetic heart valve: Secondary | ICD-10-CM

## 2024-05-20 LAB — PROTIME-INR
INR: 2.6 — ABNORMAL HIGH (ref 0.9–1.2)
Prothrombin Time: 26.8 s — ABNORMAL HIGH (ref 9.1–12.0)

## 2024-05-20 NOTE — Telephone Encounter (Signed)
 See results note sent to anticoagulation clinic staff.  [facility] draw by RN Thersia Flax yesterday per patient preference.

## 2024-05-27 ENCOUNTER — Other Ambulatory Visit: Payer: Self-pay | Admitting: Cardiology

## 2024-06-10 NOTE — Telephone Encounter (Signed)
 Patient scheduled for next level 06/16/24

## 2024-06-16 ENCOUNTER — Other Ambulatory Visit

## 2024-06-16 VITALS — Temp 98.5°F

## 2024-06-16 DIAGNOSIS — Z7901 Long term (current) use of anticoagulants: Secondary | ICD-10-CM

## 2024-06-16 NOTE — Progress Notes (Signed)
INR lab

## 2024-06-17 ENCOUNTER — Ambulatory Visit (INDEPENDENT_AMBULATORY_CARE_PROVIDER_SITE_OTHER): Admitting: *Deleted

## 2024-06-17 ENCOUNTER — Ambulatory Visit: Payer: Self-pay | Admitting: Registered Nurse

## 2024-06-17 DIAGNOSIS — Z7901 Long term (current) use of anticoagulants: Secondary | ICD-10-CM | POA: Diagnosis not present

## 2024-06-17 DIAGNOSIS — Z952 Presence of prosthetic heart valve: Secondary | ICD-10-CM

## 2024-06-17 DIAGNOSIS — Z5181 Encounter for therapeutic drug level monitoring: Secondary | ICD-10-CM

## 2024-06-17 LAB — PROTIME-INR
INR: 2.9 — ABNORMAL HIGH (ref 0.9–1.2)
Prothrombin Time: 30.3 s — ABNORMAL HIGH (ref 9.1–12.0)

## 2024-06-17 NOTE — Progress Notes (Signed)
Please see anticoagulation encounter.

## 2024-06-17 NOTE — Patient Instructions (Signed)
 Description   Spoke with pt and instructed her to continue taking Warfarin 1.5 tablets daily except for 1 tablet on Monday and Thursdays.  Stay consistent with greens each week.  Recheck INR in 5 weeks at [facility] [phone]

## 2024-07-02 ENCOUNTER — Ambulatory Visit: Admitting: Registered Nurse

## 2024-07-02 ENCOUNTER — Encounter: Payer: Self-pay | Admitting: Registered Nurse

## 2024-07-02 VITALS — BP 128/94 | HR 64 | Temp 98.5°F | Resp 16

## 2024-07-02 DIAGNOSIS — S80862A Insect bite (nonvenomous), left lower leg, initial encounter: Secondary | ICD-10-CM

## 2024-07-02 MED ORDER — HYDROCORTISONE 1 % EX LOTN: 1.0000 | TOPICAL_LOTION | Freq: Two times a day (BID) | CUTANEOUS | Status: AC

## 2024-07-02 MED ORDER — DIPHENHYDRAMINE HCL 2 % EX GEL: 1.0000 | Freq: Two times a day (BID) | CUTANEOUS | Status: AC | PRN

## 2024-07-02 MED ORDER — CEPHALEXIN 500 MG PO CAPS
500.0000 mg | ORAL_CAPSULE | Freq: Two times a day (BID) | ORAL | Status: AC
Start: 2024-07-02 — End: 2024-07-09

## 2024-07-02 MED ORDER — AQUAPHOR EX OINT: TOPICAL_OINTMENT | CUTANEOUS | Status: AC | PRN

## 2024-07-02 NOTE — Patient Instructions (Addendum)
 Bee, Wasp, or [employer], Adult Bees, wasps, and hornets are part of a family of insects that sting. Normally, a sting will cause pain, redness, and swelling at the sting site. However, some people have an allergy to these stings, and their reactions can be much more serious. What increases the risk? You may be at a greater risk of getting stung if you: Provoke a stinging insect by swatting or disturbing it. Wear strong-smelling soaps, deodorants, or body sprays. Spend time outdoors near gardens with flowers or fruit trees or in clothes that expose skin. Eat or drink outside. What are the signs or symptoms? The reaction to an insect sting can vary from a mild, normal response to life-threatening anaphylaxis. The sting site is often a red lump in the skin, sometimes with a tiny hole in the center, that may still have the stinger in the center of the wound. Normal reaction A normal reaction is experienced by most people after an insect sting. Symptoms include: Pain, redness, and swelling at the sting site. These can develop over 24-48 hours. Pain, redness, and swelling that may spread to a larger, connected area beyond the sting site. The spreading can continue over 24-48 hours. Allergic reaction An allergic reaction can vary in severity and includes symptoms in other areas of the body beyond the sting site. People who experience an allergic reaction have a higher risk of having similar or worse symptoms the next time they are stung. Symptoms may include: Hives, itching, and swelling. Abdominal symptoms including cramping, nausea, vomiting, and diarrhea. Severe symptoms that require immediate medical attention include: Chest pain or tightness. Wheezing or trouble breathing. Swelling of the tongue, throat, or lips. Trouble swallowing or hoarse voice. Anaphylactic reaction An anaphylactic reaction is a severe, life-threatening allergy and requires immediate medical attention. The symptoms often  include severe allergic reaction symptoms that develop rapidly and lead to: A sudden and sharp drop in blood pressure. Dizziness. Loss of consciousness. How is this diagnosed? This condition is usually diagnosed based on your symptoms and medical history as well as a physical exam. You may have an allergy test to determine if you are allergic to the insect venom. How is this treated? If you were stung by a bee, the stinger and a small sac of venom may be in the wound. Remove the stinger as soon as possible. Do this by brushing across the wound with gauze, a clean fingernail, or a flat card such as a credit card. This can help reduce the severity of your body's reaction to the sting. Normal reactions can be treated with: Washing the area thoroughly with soap and water. Applying ice to the area to reduce swelling. Oral or topical medicines to help reduce pain and itching, if present. Pay close attention to your symptoms after you have been stung. If possible, have someone stay with you to see if an allergic reaction develops. If allergic symptoms develop, oral antihistamines can be taken and you will need medical help right away. If you had an allergic reaction before, you may need to: Use an auto-injector pen(pre-filled automatic epinephrine  injection device)at the first sign of an allergic reaction. Get medical help right away because the epinephrine  is short-acting. It is intended to give you more time to get to an emergency room. Follow these instructions at home:  Wash the sting site 2-3 times a day with soap and water as told by your health care provider. Apply or take over-the-counter and prescription medicines only as told  by your health care provider. If directed, apply ice to the sting area. Put ice in a plastic bag. Place a towel between your skin and the bag. Leave the ice on for 20 minutes, 2-3 times a day. Do not scratch the sting area. If you had a severe allergic reaction to  a sting, you may need to: Wear a medical bracelet or necklace that lists the allergy. Carry an anaphylaxis kit or an epinephrine  auto-injector pen with you at all times. Tell your family members, friends, and coworkers when and how to use it. Use it at the first sign of an allergic reaction. How is this prevented? Avoid swatting at stinging insects and disturbing insect nests. Do not use fragrant soaps or lotions and avoid sitting near flowering plants, if possible. Wear shoes, pants, and long sleeves when spending time outdoors, especially in grassy areas where stinging insects are common. Keep outdoor areas free from nests or hives. Keep food and drink containers covered when eating outdoors. Wear gloves if you are gardening or working outdoors. Find a barrier between you and the insect(s), such as a door, if an attack by a stinging insect or a swarm seems likely. Contact a health care provider if: Your symptoms do not get better in 2-3 days. You have redness, swelling, or pain that spreads beyond the area of the sting. You have a fever. Get help right away if: You have symptoms of a severe allergic reaction. These include: Chest tightness or pain. Wheezing, or trouble swallowing or breathing. Light-headedness, dizziness, or fainting. Itchy, raised, red patches on the skin beyond the sting site. Abdominal cramping, nausea, vomiting, or diarrhea. Trouble swallowing or a swollen tongue, throat, or lips. These symptoms may be an emergency. Get help right away. Call 911. Do not wait to see if the symptoms will go away. Do not drive yourself to the hospital. Summary Stings from bees, wasps, and hornets can cause pain and swelling, but they are usually not serious. However, some people may have an allergic reaction to a sting. This can cause the symptoms to be more severe. Pay close attention to your symptoms after you have been stung. If possible, have someone stay with you to look for  signs of worsening symptoms. Call your health care provider if you have any signs of an allergic reaction. This information is not intended to replace advice given to you by your health care provider. Make sure you discuss any questions you have with your health care provider. Document Revised: 02/06/2022 Document Reviewed: 02/06/2022 Elsevier Patient Education  2024 [employer]  Cellulitis, Adult  Cellulitis is a skin infection. The infected area is usually warm, red, swollen, and tender. It most commonly occurs on the lower body, such as the legs, feet, and toes, but this condition can occur on any part of the body. The infection can travel to the muscles, blood, and underlying tissue and become life-threatening without treatment. It is important to get medical treatment right away for this condition. What are the causes? Cellulitis is caused by bacteria. The bacteria enter through a break in the skin, such as a cut, burn, insect or animal bite, open sore, or crack. What increases the risk? This condition is more likely to occur in people who: Have a weak body's defense system (immune system). Are older than 62 years old. Have diabetes. Have a type of long-term (chronic) liver disease (cirrhosis) or kidney disease. Are obese. Have a skin condition such as: An itchy rash, such  as eczema or psoriasis. A fungal rash on the feet or in skinfolds. Blistering rashes, such as shingles or chickenpox. Slow movement of blood in the veins (venous stasis). Fluid buildup below the skin (edema). Have open wounds on the skin, such as cuts, puncture wounds, burns, bites, scrapes, tattoos, piercings, or wounds from surgery. Have had radiation therapy. Use IV drugs. What are the signs or symptoms? Symptoms of this condition include: Skin that looks red, purple, or slightly darker than your usual skin color. Streaks or spots on the skin. Swollen area of the skin. Tenderness or pain when an area of  the skin is touched. Warm skin. Fever or chills. Blisters. Tiredness (fatigue). How is this diagnosed? This condition is diagnosed based on a medical history and physical exam. You may also have tests, including: Blood tests. Imaging tests. Tests on a sample of fluid taken from the wound (wound culture). How is this treated? Treatment for this condition may include: Medicines. These may include antibiotics or medicines to treat allergies (antihistamines). Rest. Applying cold or warm wet cloths (compresses) to the skin. If the condition is severe, you may need to stay in the hospital and get antibiotics through an IV. The infection usually starts to get better within 1-2 days of treatment. Follow these instructions at home: Medicines Take over-the-counter and prescription medicines only as told by your health care provider. If you were prescribed antibiotics, take them as told by your provider. Do not stop using the antibiotic even if you start to feel better. General instructions Drink enough fluid to keep your pee (urine) pale yellow. Do not touch or rub the infected area. Raise (elevate) the infected area above the level of your heart while you are sitting or lying down. Return to your normal activities as told by your provider. Ask your provider what activities are safe for you. Apply warm or cold compresses to the affected area as told by your provider. Keep all follow-up visits. Your provider will need to make sure that a more serious infection is not developing. Contact a health care provider if: You have a fever. Your symptoms do not improve within 1-2 days of starting treatment or you develop new symptoms. Your bone or joint underneath the infected area becomes painful after the skin has healed. Your infection returns in the same area or another area. Signs of this may include: You notice a swollen bump in the infected area. Your red area gets larger, turns dark in color, or  becomes more painful. Drainage increases. Pus or a bad smell develops in your infected area. You have more pain. You feel ill and have muscle aches and weakness. You develop vomiting or diarrhea that will not go away. Get help right away if: You notice red streaks coming from the infected area. You notice the skin turns purple or black and falls off. This symptom may be an emergency. Get help right away. Call 911. Do not wait to see if the symptom will go away. Do not drive yourself to the hospital. This information is not intended to replace advice given to you by your health care provider. Make sure you discuss any questions you have with your health care provider. Document Revised: 08/07/2022 Document Reviewed: 08/07/2022 Elsevier Patient Education  2024 [employer].

## 2024-07-02 NOTE — Progress Notes (Signed)
 Established Patient Office Visit  Subjective   Patient ID: Victoria [name], female    DOB: 06-25-1961  Age: 63 y.o. MRN: [id number]  Chief Complaint  Patient presents with   Rash    Bee sting left lower leg    63y/o caucasian female established patient here for evaluation bee bite versus sting left lower leg hot to touch, red rash and pain if touched 3/10.  Was itchy initially occurred on Monday.  Did not see stinger.  Saw large bumblebee near her leg prior to getting red mark on leg that has since spread.  Bee flew off.  Denied fever/chills/n/v/d/wheezing/shortness of breath/purulent discharge from site.  Has applied ice and taken antihistamine oral.      Review of Systems  Constitutional:  Negative for chills, diaphoresis and fever.  HENT:  Negative for congestion.   Eyes:  Negative for pain, discharge and redness.  Respiratory:  Negative for cough, sputum production, shortness of breath, wheezing and stridor.   Cardiovascular:  Negative for chest pain.  Gastrointestinal:  Negative for diarrhea, nausea and vomiting.  Musculoskeletal:  Positive for myalgias.  Skin:  Positive for itching and rash.  Neurological:  Negative for dizziness, speech change, seizures, loss of consciousness, weakness and headaches.  Psychiatric/Behavioral:  The patient does not have insomnia.       Objective:     BP (!) 128/94 (BP Location: Left Arm, Patient Position: Sitting, Cuff Size: Normal)   Pulse 64   Temp 98.5 F (36.9 C) (Tympanic)   Resp 16   SpO2 98%    Physical Exam Vitals and nursing note reviewed.  Constitutional:      General: She is awake. She is not in acute distress.    Appearance: Normal appearance. She is well-developed, well-groomed and normal weight. She is not ill-appearing, toxic-appearing or diaphoretic.  HENT:     Head: Normocephalic and atraumatic.     Jaw: There is normal jaw occlusion. No malocclusion.     Salivary Glands: Right salivary gland is not  diffusely enlarged. Left salivary gland is not diffusely enlarged.     Right Ear: Hearing and external ear normal. No decreased hearing noted. No laceration, drainage or swelling.     Left Ear: Hearing and external ear normal. No decreased hearing noted. No laceration, drainage or swelling.     Nose: Nose normal. No signs of injury, laceration, mucosal edema, congestion or rhinorrhea.     Right Nostril: No epistaxis.     Left Nostril: No epistaxis.     Mouth/Throat:     Lips: Pink. No lesions.     Mouth: Mucous membranes are moist. No oral lesions or angioedema.     Dentition: No gum lesions.     Tongue: No lesions. Tongue does not deviate from midline.     Palate: No mass and lesions.     Pharynx: Oropharynx is clear. Uvula midline. No oropharyngeal exudate or uvula swelling.  Eyes:     General: Lids are normal. Vision grossly intact. Gaze aligned appropriately. Allergic shiner present. No scleral icterus.       Right eye: No discharge.        Left eye: No discharge.     Extraocular Movements: Extraocular movements intact.     Conjunctiva/sclera: Conjunctivae normal.     Pupils: Pupils are equal, round, and reactive to light.  Neck:     Trachea: Trachea and phonation normal.  Cardiovascular:     Rate and Rhythm: Normal rate and regular  rhythm.     Pulses: Normal pulses.          Radial pulses are 2+ on the right side and 2+ on the left side.  Pulmonary:     Effort: Pulmonary effort is normal.     Breath sounds: Normal breath sounds and air entry. No stridor or transmitted upper airway sounds. No decreased breath sounds or wheezing.     Comments: Spoke full sentences without difficulty; no cough observed in exam room Abdominal:     General: Abdomen is flat.  Musculoskeletal:        General: Swelling and tenderness present. No deformity or signs of injury. Normal range of motion.     Right hand: Normal strength. Normal capillary refill.     Left hand: Normal strength. Normal  capillary refill.     Cervical back: Normal range of motion and neck supple. No swelling, edema, deformity, erythema, signs of trauma, lacerations, rigidity, torticollis or crepitus. No pain with movement. Normal range of motion.     Thoracic back: No swelling, edema, deformity, signs of trauma or lacerations. Normal range of motion.     Right lower leg: No swelling, deformity, lacerations or tenderness. No edema.     Left lower leg: Swelling and tenderness present. No deformity or lacerations. Edema present.       Legs:     Comments: 0-1+ nonpitting edema anterior left lower leg mildly TTP macular erythema dry  Lymphadenopathy:     Head:     Right side of head: No submandibular or preauricular adenopathy.     Left side of head: No submandibular or preauricular adenopathy.     Cervical:     Right cervical: No superficial cervical adenopathy.    Left cervical: No superficial cervical adenopathy.  Skin:    General: Skin is warm and dry.     Capillary Refill: Capillary refill takes less than 2 seconds.     Coloration: Skin is not ashen, cyanotic, jaundiced, mottled, pale or sallow.     Findings: Abrasion, erythema and rash present. No abscess, bruising, burn, ecchymosis, signs of injury, laceration, lesion, petechiae or wound. Rash is macular and papular. Rash is not crusting, nodular, purpuric, pustular, scaling or vesicular.     Nails: There is no clubbing.         Comments: Lacey macular erythema surrounding abrasion nummular scabbed brown dry less than 1cm diameter left anterior shin; mildly increased temperature affected area  Neurological:     General: No focal deficit present.     Mental Status: She is alert and oriented to person, place, and time. Mental status is at baseline.     GCS: GCS eye subscore is 4. GCS verbal subscore is 5. GCS motor subscore is 6.     Cranial Nerves: No cranial nerve deficit, dysarthria or facial asymmetry.     Motor: Motor function is intact. No weakness,  tremor, atrophy, abnormal muscle tone or seizure activity.     Coordination: Coordination is intact. Coordination normal.     Gait: Gait is intact. Gait normal.     Comments: In/out of chair without difficulty; gait sure and steady in clinic; bilateral hand grasp equal 5/5  Psychiatric:        Attention and Perception: Attention and perception normal.        Mood and Affect: Mood and affect normal.        Speech: Speech normal.        Behavior: Behavior normal. Behavior is  cooperative.        Thought Content: Thought content normal.        Cognition and Memory: Cognition and memory normal.        Judgment: Judgment normal.      No results found for any visits on 07/02/24.    The 10-year ASCVD risk score (Arnett DK, et al., 2019) is: 3.8%    Assessment & Plan:   Problem List Items Addressed This Visit   None Visit Diagnoses       Insect bite of left lower leg, initial encounter    -  Primary     Did not require oral steroids.  Solitary lesion.  Dispensed cephalexin  250mg  po BID x 7 days #30 RF0 from [facility] to patient start if worsening pain/rash/redness spreading or discharge/fever.  Discussed it can interact with her warfarin and cause increased bleeding therefore if bruising/nosebleeds during use to notify clinic staff.  Given UD calagel 2% apply BID prn itching and hydrocortisone  1% cream topical BID prn itching.  Aquaphor ointment QID prn dry/irritated skin.  May continue oral antihistamine prn itching.  Wash affected area daily with soap and water.  Avoid scratching.  May apply ice 15 minutes po TID prn pain/swelling.  Exitcare handout on skin infection and insect bite. RTC if worsening erythema, pain, purulent discharge, fever. Wash towels, washcloths, sheets in hot water with bleach every couple of days until infection resolved if discharge noted.  If area starts to drain cover with bandage until healed over.  Change bandage daily  Patient verbalized understanding, agreed with  plan of care and had no further questions at this time.    Return if symptoms worsen or fail to improve.    Ellouise DELENA Hope, NP

## 2024-07-14 ENCOUNTER — Other Ambulatory Visit: Payer: Self-pay | Admitting: Cardiology

## 2024-07-14 DIAGNOSIS — Z7901 Long term (current) use of anticoagulants: Secondary | ICD-10-CM

## 2024-07-21 ENCOUNTER — Other Ambulatory Visit: Payer: Self-pay | Admitting: Registered Nurse

## 2024-07-21 ENCOUNTER — Ambulatory Visit: Payer: Self-pay | Admitting: Registered Nurse

## 2024-07-21 ENCOUNTER — Telehealth: Payer: Self-pay | Admitting: *Deleted

## 2024-07-21 ENCOUNTER — Other Ambulatory Visit

## 2024-07-21 DIAGNOSIS — R7303 Prediabetes: Secondary | ICD-10-CM

## 2024-07-21 LAB — PROTIME-INR

## 2024-07-21 NOTE — Progress Notes (Signed)
 Completed labs

## 2024-07-21 NOTE — Telephone Encounter (Signed)
 Spoke with patinet to remind her that her INR was due. [facility] will [facility] to get [facility] done.

## 2024-07-22 ENCOUNTER — Ambulatory Visit (INDEPENDENT_AMBULATORY_CARE_PROVIDER_SITE_OTHER): Admitting: *Deleted

## 2024-07-22 ENCOUNTER — Ambulatory Visit: Payer: Self-pay | Admitting: Registered Nurse

## 2024-07-22 DIAGNOSIS — Z5181 Encounter for therapeutic drug level monitoring: Secondary | ICD-10-CM

## 2024-07-22 DIAGNOSIS — Z952 Presence of prosthetic heart valve: Secondary | ICD-10-CM | POA: Diagnosis not present

## 2024-07-22 DIAGNOSIS — Z7901 Long term (current) use of anticoagulants: Secondary | ICD-10-CM

## 2024-07-22 LAB — PROTIME-INR
INR: 1.9 — ABNORMAL HIGH (ref 0.9–1.2)
Prothrombin Time: 19.9 s — ABNORMAL HIGH (ref 9.1–12.0)

## 2024-07-22 NOTE — Progress Notes (Signed)
 INR 1.9; Please see anticoagulation encounter

## 2024-07-22 NOTE — Telephone Encounter (Signed)
 INR-1.9; Spoke with pt and instructed her to take 2 tablets of warfarin today and then continue taking Warfarin 1.5 tablets daily except for 1 tablet on Monday and Thursdays.  Stay consistent with greens each week.  Recheck INR in 4 weeks at [facility]

## 2024-07-22 NOTE — Patient Instructions (Signed)
 Description   Spoke with pt and instructed her to take 2 tablets today and then continue taking Warfarin 1.5 tablets daily except for 1 tablet on Monday and Thursdays.  Stay consistent with greens each week.  Recheck INR in 4 weeks at [facility].  Coumadin  Clinic [phone]

## 2024-08-18 ENCOUNTER — Other Ambulatory Visit

## 2024-08-18 ENCOUNTER — Other Ambulatory Visit: Payer: Self-pay | Admitting: Registered Nurse

## 2024-08-18 ENCOUNTER — Encounter: Payer: Self-pay | Admitting: *Deleted

## 2024-08-18 ENCOUNTER — Telehealth: Payer: Self-pay | Admitting: *Deleted

## 2024-08-18 NOTE — Progress Notes (Signed)
 PT/INR drawn on first attempt from [facility]

## 2024-08-18 NOTE — Telephone Encounter (Signed)
 This encounter was created in error - please disregard.

## 2024-08-18 NOTE — Telephone Encounter (Signed)
 Called patient since INR is due; she states [facility] will [facility] and have [facility]. Will follow up

## 2024-08-18 NOTE — Patient Instructions (Signed)
 NA

## 2024-08-19 ENCOUNTER — Ambulatory Visit (INDEPENDENT_AMBULATORY_CARE_PROVIDER_SITE_OTHER): Admitting: *Deleted

## 2024-08-19 DIAGNOSIS — Z5181 Encounter for therapeutic drug level monitoring: Secondary | ICD-10-CM

## 2024-08-19 DIAGNOSIS — Z952 Presence of prosthetic heart valve: Secondary | ICD-10-CM

## 2024-08-19 DIAGNOSIS — Z7901 Long term (current) use of anticoagulants: Secondary | ICD-10-CM

## 2024-08-19 LAB — PROTIME-INR
INR: 2 — ABNORMAL HIGH (ref 0.9–1.2)
Prothrombin Time: 20.6 s — ABNORMAL HIGH (ref 9.1–12.0)

## 2024-08-19 NOTE — Progress Notes (Signed)
  Description   INR-2.0; Spoke with pt and instructed her to take 2 tablets today and then continue taking Warfarin 1.5 tablets daily except for 1 tablet on Monday and Thursdays.  Stay consistent with greens each week.  Recheck INR in 5 weeks at [facility]  Coumadin  Clinic [phone]

## 2024-08-19 NOTE — Patient Instructions (Addendum)
 Description   INR-2.0; Spoke with pt and instructed her to take 2 tablets today and then continue taking Warfarin 1.5 tablets daily except for 1 tablet on Monday and Thursdays.  Stay consistent with greens each week.  Recheck INR in 5 weeks at [facility]  Coumadin  Clinic [phone]

## 2024-08-25 ENCOUNTER — Ambulatory Visit: Payer: Self-pay | Admitting: Registered Nurse

## 2024-08-25 DIAGNOSIS — Z7901 Long term (current) use of anticoagulants: Secondary | ICD-10-CM

## 2024-09-08 ENCOUNTER — Ambulatory Visit: Admitting: Registered Nurse

## 2024-09-08 ENCOUNTER — Encounter: Payer: Self-pay | Admitting: Registered Nurse

## 2024-09-08 VITALS — BP 129/70 | HR 59 | Temp 97.1°F

## 2024-09-08 DIAGNOSIS — L819 Disorder of pigmentation, unspecified: Secondary | ICD-10-CM

## 2024-09-08 DIAGNOSIS — L989 Disorder of the skin and subcutaneous tissue, unspecified: Secondary | ICD-10-CM

## 2024-09-08 NOTE — Patient Instructions (Signed)
Preventing Skin Cancer, Adult Skin cancer is the most common type of cancer. There are three main types: Squamous cell. Basal cell. Melanoma. Squamous cell and basal cell skin cancer are the most common types. Melanoma is the most dangerous type. Most skin cancers are caused by skin damage from exposure to ultraviolet (UV) light. UV light comes from the sun and from artificial tanning beds. Suntans and sunburns result from exposure to UV light. Skin cancer occurs in people of all skin colors. Skin cancer occurs most often in older people, but it is usually the result of damage done earlier in life. The tans and sunburns you get at any age can lead to skin cancer in the future. To help prevent this, you can take steps to protect yourself. What actions can I take to protect myself from skin cancer? Many people like to get a tan, especially in the summer or when on vacation. However, tan or burned skin is a sign of skin damage and increases your risk for skin cancer. To lower your risk: Avoid exposure to UV light  Try to stay out of the sun between 10 a.m. and 4 p.m. whenever possible. This is when the sun is at its strongest. Seek the shade during this time. Remember that you can also be exposed to UV rays on cloudy or hazy days. Nancy Fetter exposure can be risky year-round, not just in the summer. Do not use a sunlamp, tanning bed, or tanning booth to get a tan. If you really want a tan, use an artificial tanning lotion. Avoid getting sunburned. Sunburns are more common on bright sunny days, especially when you are in areas where the sun is reflected off water or snow. Use sunscreen and protective clothing  Always use sunscreen--either a cream, lotion, or spray--when you are out in the sun. Keep sunscreen handy, such as in your gym bag or in your car, so that you will have it when you need it. Use sunscreen with a sun protection factor (SPF) of 30 or higher. Make sure your sunscreen protects you from UVA  and UVB light. It should also be water-resistant. Use enough sunscreen to cover all exposed areas of your skin. Put it on 15-30 minutes before you go out. Reapply sunscreen every 2 hours or anytime you come out of the water. When you are out in the sun, wear a broad-brimmed hat, clothing that covers your arms and legs, and wraparound sunglasses. Protect your lips by wearing a lip balm or lip stick with an SPF of at least 30. Check your skin for changes Check your skin often from head to toe to look for any changes in the size, color, or shape of any moles or freckles. Check for any new moles or moles that bleed or become itchy. See your health care provider if you notice any changes. Ask your health care provider about a total skin check. Ask if it should be part of your yearly physical or if you need to see a skin specialist (dermatologist). Take other preventive measures  Avoid exposure to harmful chemicals, such as arsenic. You can do this by: Having your home's water tested for arsenic and other chemicals. Taking protective measures to avoid exposure to chemicals at work. Do not use any products that contain nicotine or tobacco. These products include cigarettes, chewing tobacco, and vaping devices, such as e-cigarettes. If you need help quitting, ask your health care provider. Keep your immune system healthy. Take steps, such as: Staying up to  date on all vaccines, including the human papillomavirus (HPV) vaccine. Eating at least 5 servings of fruits and vegetables every day. Why are these changes important? About 1 of every 5 people will get skin cancer. The best way to reduce your risk is to avoid skin damage from UV light. If you have teenagers in your house, they should know that just five bad sunburns as a teen could double their risk of skin cancer in the future. If you have younger children, always make sure to protect their skin from the sun. These changes can help reduce your risk of  skin cancer. They will also provide other health benefits, such as: Protecting your skin from the sun can help prevent painful sunburns, sun poisoning, and other skin damage and blemishes. This is especially important if: You have pale white skin, freckles, and red hair. You burn easily. Avoiding exposure to harmful chemicals can help prevent damage to other tissues in your body, such as your lungs, and prevent other types of cancer. Avoiding smoking tobacco can reduce your risk for other types of cancer and other health problems. Eating a healthy diet is good for your overall health. What can happen if changes are not made? If you do not make these changes, you will be at higher risk for skin cancer. If you develop skin cancer, the treatments could result in lost time from work and changes in your appearance from scars. The most dangerous type of skin cancer, melanoma, can be deadly if not found early. Where to find support For more support, talk to your health care provider or dermatologist. Where to find more information Learn more about skin cancer from: The Skin Cancer Foundation: www.skincancer.org/prevention The Centers for Disease Control and Prevention: LatePreviews.co.uk The American Academy of Dermatology: InfoExam.si Summary Skin cancer is the most common type of cancer. Melanoma skin cancer can be deadly if not found early. Sunburns and tanning increase your risk for skin cancer. Protecting your skin from UV light is the best way to prevent skin cancer. This information is not intended to replace advice given to you by your health care provider. Make sure you discuss any questions you have with your health care provider. Document Revised: 06/06/2021 Document Reviewed: 06/06/2021 Elsevier Patient Education  2024 [employer].

## 2024-09-08 NOTE — Progress Notes (Signed)
 Established Patient Office Visit  Subjective   Patient ID: Victoria [name], female    DOB: 1961-05-19  Age: 63 y.o. MRN: [id number]  Chief Complaint  Patient presents with   Rash    Nonhealing nose and lip lesions x multiple years    62y/o caucasian female established patient here for nonhealing lesion left lateral nose noted when she had blood vessel in nose cauterized at ENT a year ago scabs up and sometimes bleeds denied purulent discharge.  Wants liquid nitrogen freezing in cilnic today.  Upper lip with lesion also x 5 years hyperpigmented nonhealing scabs denied purulent discharge/pain.      Review of Systems  Constitutional:  Negative for chills, diaphoresis and fever.  HENT:  Negative for sore throat.   Respiratory:  Negative for cough, shortness of breath and wheezing.   Musculoskeletal:  Negative for neck pain.  Skin:  Positive for rash.  Neurological:  Negative for dizziness, tingling, tremors, sensory change and headaches.  Psychiatric/Behavioral:  The patient does not have insomnia.       Objective:     BP 129/70 (BP Location: Left Arm, Patient Position: Sitting, Cuff Size: Normal)   Pulse (!) 59   Temp (!) 97.1 F (36.2 C) (Tympanic)   SpO2 95%    Physical Exam Vitals and nursing note reviewed.  Constitutional:      General: She is awake. She is not in acute distress.    Appearance: Normal appearance. She is well-developed, well-groomed and normal weight. She is not ill-appearing, toxic-appearing or diaphoretic.  HENT:     Head: Normocephalic and atraumatic.     Jaw: There is normal jaw occlusion.     Salivary Glands: Right salivary gland is not diffusely enlarged. Left salivary gland is not diffusely enlarged.     Right Ear: Hearing and external ear normal.     Left Ear: Hearing and external ear normal.     Nose: Nose normal. No congestion or rhinorrhea.     Mouth/Throat:     Lips: Pink. No lesions.     Mouth: Mucous membranes are moist.      Pharynx: Oropharynx is clear.  Eyes:     General: Lids are normal. Vision grossly intact. Gaze aligned appropriately. No scleral icterus.       Right eye: No discharge.        Left eye: No discharge.     Extraocular Movements: Extraocular movements intact.     Conjunctiva/sclera: Conjunctivae normal.     Pupils: Pupils are equal, round, and reactive to light.  Neck:     Trachea: Trachea normal.  Cardiovascular:     Rate and Rhythm: Normal rate and regular rhythm.     Pulses: Normal pulses.  Pulmonary:     Effort: Pulmonary effort is normal.     Breath sounds: Normal breath sounds and air entry. No stridor or transmitted upper airway sounds. No wheezing.     Comments: Spoke full sentences without difficulty; no cough observed in exam room Abdominal:     General: Abdomen is flat.  Musculoskeletal:        General: Normal range of motion.     Cervical back: Normal range of motion and neck supple. No rigidity or tenderness.     Right lower leg: No edema.     Left lower leg: No edema.  Lymphadenopathy:     Head:     Right side of head: No submental, submandibular, tonsillar, preauricular, posterior auricular or occipital adenopathy.  Left side of head: No submental, submandibular, tonsillar, preauricular, posterior auricular or occipital adenopathy.     Cervical: No cervical adenopathy.     Right cervical: No superficial cervical adenopathy.    Left cervical: No superficial cervical adenopathy.  Skin:    General: Skin is warm and dry.     Capillary Refill: Capillary refill takes less than 2 seconds.     Coloration: Skin is not ashen, cyanotic, jaundiced, mottled, pale or sallow.     Findings: Abrasion, lesion and rash present. No abscess, acne, bruising, burn, ecchymosis, erythema, signs of injury, laceration, petechiae or wound. Rash is macular and papular. Rash is not crusting, nodular, purpuric, pustular, scaling, urticarial or vesicular.     Nails: There is no clubbing.      Comments: Less than 1cm hyperpigmented scabbed lesion dry upper lip central nonhealing x 5 years; left lateral nares scabs and bleeds present 1 year 4mm diameter  Neurological:     General: No focal deficit present.     Mental Status: She is alert and oriented to person, place, and time. Mental status is at baseline.     GCS: GCS eye subscore is 4. GCS verbal subscore is 5. GCS motor subscore is 6.     Cranial Nerves: No cranial nerve deficit, dysarthria or facial asymmetry.     Motor: Motor function is intact. No weakness, tremor, abnormal muscle tone or seizure activity.     Coordination: Coordination is intact. Coordination normal.     Gait: Gait is intact. Gait normal.     Comments: In/out of chair without difficulty; gait sure and steady in clinic; bilateral hand grasp equal 5/5  Psychiatric:        Attention and Perception: Attention and perception normal.        Mood and Affect: Mood and affect normal.        Speech: Speech normal.        Behavior: Behavior normal. Behavior is cooperative.        Thought Content: Thought content normal.        Cognition and Memory: Cognition and memory normal.        Judgment: Judgment normal.      No results found for any visits on 09/08/24.    The 10-year ASCVD risk score (Arnett DK, et al., 2019) is: 3.9%    Assessment & Plan:   Problem List Items Addressed This Visit   None Visit Diagnoses       Hyperpigmented skin lesion    -  Primary   Relevant Orders   Ambulatory referral to Dermatology     Facial skin lesion         A-ambulatory referral dermatology for evaluation and treatment nonhealing facial lesions lip and nose present for greater than 1 year scabbed and occasional bleeding.  See pictures in chart.  Discussed do not pick/scratch area may apply aquaphor BID prn irritated skin.  ABCDEs discussed monitor moles for changes especially if bleeding, not healing, enlarging quickly follow up re-evaluation with a provider and  consider punch biopsy/freezing.  Exitcare handout on preventing skin cancer.   Discussed Asymmetrical, Border irregular, Changing color, Diameter enlarging especially greater than 1cm, Excoriation/Evolving lesion when performing self-skin exams. Continue self-skin exams on regular basis, wear protective clothing and sunscreen at least 30 SPF. Follow up if any of above symptoms noted and I recommend biopsy by [facility] if they perform otherwise dermatology. Patient verbalized understanding of instructions, agreed with plan of care and had  no further questions at this time.    Return if symptoms worsen or fail to improve, for schedule dermatology.    Ellouise DELENA Hope, NP

## 2024-09-23 ENCOUNTER — Other Ambulatory Visit: Payer: Self-pay | Admitting: Registered Nurse

## 2024-09-23 ENCOUNTER — Other Ambulatory Visit: Payer: Self-pay | Admitting: *Deleted

## 2024-09-23 ENCOUNTER — Ambulatory Visit: Admitting: Registered Nurse

## 2024-09-23 ENCOUNTER — Telehealth: Payer: Self-pay | Admitting: *Deleted

## 2024-09-23 ENCOUNTER — Other Ambulatory Visit

## 2024-09-23 DIAGNOSIS — Z7901 Long term (current) use of anticoagulants: Secondary | ICD-10-CM

## 2024-09-23 DIAGNOSIS — Z952 Presence of prosthetic heart valve: Secondary | ICD-10-CM

## 2024-09-23 DIAGNOSIS — Z5181 Encounter for therapeutic drug level monitoring: Secondary | ICD-10-CM

## 2024-09-23 NOTE — Progress Notes (Unsigned)
 Presents for PT INR, sending specimen to [facility].

## 2024-09-23 NOTE — Telephone Encounter (Signed)
 Called patient since her INR is due today.She states she will go soon and appreciative of the call as she had not flipped her calendar to October.

## 2024-09-24 ENCOUNTER — Ambulatory Visit: Payer: Self-pay | Admitting: Registered Nurse

## 2024-09-24 ENCOUNTER — Ambulatory Visit (INDEPENDENT_AMBULATORY_CARE_PROVIDER_SITE_OTHER): Admitting: Internal Medicine

## 2024-09-24 DIAGNOSIS — Z5181 Encounter for therapeutic drug level monitoring: Secondary | ICD-10-CM | POA: Diagnosis not present

## 2024-09-24 DIAGNOSIS — Z952 Presence of prosthetic heart valve: Secondary | ICD-10-CM

## 2024-09-24 DIAGNOSIS — Z7901 Long term (current) use of anticoagulants: Secondary | ICD-10-CM

## 2024-09-24 LAB — PROTIME-INR
INR: 1.5 — ABNORMAL HIGH (ref 0.9–1.2)
Prothrombin Time: 15.4 s — ABNORMAL HIGH (ref 9.1–12.0)

## 2024-09-24 NOTE — Progress Notes (Signed)
 INR 1.5  Please see anticoagulation encounter Spoke with pt and instructed her to take 2 tablets today and then continue taking Warfarin 1.5 tablets daily except for 1 tablet on Monday and Thursdays.  Stay consistent with greens each week.  Recheck INR in 4 weeks at [facility] [phone]

## 2024-09-24 NOTE — Progress Notes (Signed)
   Established Patient Office Visit  Subjective   Patient ID: Victoria [name], female    DOB: [birth year]  Age: 63 y.o. MRN: [id number]  Chief Complaint  Patient presents with   Coagulation Disorder    PT INR draw    HPI    ROS    Objective:     There were no vitals taken for this visit.   Physical Exam   Results for orders placed or performed in visit on 09/23/24  Protime-INR  Result Value Ref Range   INR 1.5 (H) 0.9 - 1.2   Prothrombin Time 15.4 (H) 9.1 - 12.0 sec      The 10-year ASCVD risk score (Arnett DK, et al., 2019) is: 3.9%    Assessment & Plan:   Problem List Items Addressed This Visit       Other   Chronic anticoagulation - Primary (Chronic)   Relevant Orders   Prothrombin Time   S/P AVR (Chronic)   Relevant Orders   Prothrombin Time   Encounter for therapeutic drug monitoring   Relevant Orders   Prothrombin Time  Patient had PT INR drawn by RN Karene 09/23/24 and results to be sent by [facility] to anticoagulation clinic staff per request.  No follow-ups on file.    Ellouise DELENA Hope, NP

## 2024-10-01 NOTE — Telephone Encounter (Signed)
 Patient reported dermatology called her with biopsy results and she has to return for further excision of nose lesion.  Next PT/INR draw scheduled for 10/21/24

## 2024-10-14 ENCOUNTER — Ambulatory Visit: Admitting: General Practice

## 2024-10-14 VITALS — BP 143/76 | HR 52 | Temp 97.6°F | Resp 18

## 2024-10-14 DIAGNOSIS — Z4889 Encounter for other specified surgical aftercare: Secondary | ICD-10-CM | POA: Insufficient documentation

## 2024-10-14 NOTE — Progress Notes (Signed)
 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 Reports having a biopsy last week and hopes they got everything. States pain as like a toothache, just aches. Took Tylenol  last week but really haven't taken much. Skin graft taken from front of the Lt ear. Reports putting Vaseline on that site, denies any oozing of blood or drainage from either site. Has been able to wash hair once and was careful not to get the nose wet, is permitted to get only the ear site wet. See VS. RN removed the bandage to assess the Lt nare, dried blood on the bandage. Site has dried blood on the perimeter of the wound, 0.5 cm in size. Folded gauze embedded and stitched into wound. No active bleeding or drainage noted. Lt anterior ear with vertical 4 cm closed stitched area, slight edema noted. Site also without bleeding or drainage.  Bandage replaced to the Lt nare, provided extra bandages if needed. Provided triple antibiotic ointment and advised to place on the anterior ear site twice a day and to no longer use plain Vaseline. Advised to notify her dermatologist or this RN if any drainage (of any color) or smell starts from either surgical site. Also provided OTC Acetaminophen  325 mg X 2 tabs to take for achy pain.  Will schedule a clinic visit for this time next week to reassess. Requested employee to call, email, or revisit the clinic if any changes or new s/s occur.

## 2024-10-21 ENCOUNTER — Other Ambulatory Visit: Payer: Self-pay | Admitting: General Practice

## 2024-10-21 ENCOUNTER — Telehealth: Payer: Self-pay | Admitting: *Deleted

## 2024-10-21 ENCOUNTER — Other Ambulatory Visit: Admitting: General Practice

## 2024-10-21 DIAGNOSIS — R7303 Prediabetes: Secondary | ICD-10-CM

## 2024-10-21 NOTE — Progress Notes (Signed)
 Clinic visit for PT INR lab draw. Will send to [facility] today.

## 2024-10-21 NOTE — Telephone Encounter (Signed)
 Called patient since INR is due; there was no answer and mailbox is full. Will try back later.

## 2024-10-21 NOTE — Telephone Encounter (Signed)
 Spoke with patient and advised that INR is due. She states she will head over to the lab soon.

## 2024-10-21 NOTE — Progress Notes (Signed)
   Established Patient Office Visit  Subjective   Patient ID: Victoria [name], female    DOB: [birth year]  Age: 63 y.o. MRN: [id number]  No chief complaint on file.   HPI    ROS    Objective:     There were no vitals taken for this visit.   Physical Exam   No results found for any visits on 10/21/24.    The 10-year ASCVD risk score (Arnett DK, et al., 2019) is: 4.8%    Assessment & Plan:   Problem List Items Addressed This Visit   None   No follow-ups on file.    Victoria [name] C, RN

## 2024-10-22 ENCOUNTER — Ambulatory Visit: Payer: Self-pay | Admitting: Registered Nurse

## 2024-10-22 ENCOUNTER — Ambulatory Visit (INDEPENDENT_AMBULATORY_CARE_PROVIDER_SITE_OTHER): Admitting: Student in an Organized Health Care Education/Training Program

## 2024-10-22 DIAGNOSIS — Z7901 Long term (current) use of anticoagulants: Secondary | ICD-10-CM | POA: Diagnosis not present

## 2024-10-22 DIAGNOSIS — Z952 Presence of prosthetic heart valve: Secondary | ICD-10-CM

## 2024-10-22 DIAGNOSIS — Z5181 Encounter for therapeutic drug level monitoring: Secondary | ICD-10-CM | POA: Diagnosis not present

## 2024-10-22 LAB — PROTIME-INR
INR: 2.4 — ABNORMAL HIGH (ref 0.9–1.2)
Prothrombin Time: 24.7 s — ABNORMAL HIGH (ref 9.1–12.0)

## 2024-10-22 NOTE — Progress Notes (Signed)
 INR 2.4 Please see anticoagulation encounter Spoke with pt and instructed her to continue taking Warfarin 1.5 tablets daily except for 1 tablet on Monday and Thursdays.  Stay consistent with greens each week.  Recheck INR in 4 weeks at [facility]inic [phone]

## 2024-11-17 ENCOUNTER — Other Ambulatory Visit: Payer: Self-pay | Admitting: Registered Nurse

## 2024-11-17 ENCOUNTER — Telehealth: Payer: Self-pay | Admitting: *Deleted

## 2024-11-17 ENCOUNTER — Ambulatory Visit: Admitting: General Practice

## 2024-11-17 ENCOUNTER — Other Ambulatory Visit: Payer: Self-pay | Admitting: General Practice

## 2024-11-17 DIAGNOSIS — Z7901 Long term (current) use of anticoagulants: Secondary | ICD-10-CM

## 2024-11-17 DIAGNOSIS — Z5181 Encounter for therapeutic drug level monitoring: Secondary | ICD-10-CM

## 2024-11-17 NOTE — Telephone Encounter (Signed)
 Called patient since INR is due today, she states she will go get it done now. Will await and follow up.

## 2024-11-17 NOTE — Progress Notes (Signed)
 Employee presents to the clinic stating Cardiology office called requesting to have a PT INR drawn. Lab drawn and will send to [facility] this afternoon.

## 2024-11-18 ENCOUNTER — Ambulatory Visit: Payer: Self-pay | Admitting: Registered Nurse

## 2024-11-18 ENCOUNTER — Ambulatory Visit (INDEPENDENT_AMBULATORY_CARE_PROVIDER_SITE_OTHER): Payer: Self-pay

## 2024-11-18 ENCOUNTER — Ambulatory Visit: Admitting: General Practice

## 2024-11-18 DIAGNOSIS — Z5181 Encounter for therapeutic drug level monitoring: Secondary | ICD-10-CM | POA: Diagnosis not present

## 2024-11-18 DIAGNOSIS — Z952 Presence of prosthetic heart valve: Secondary | ICD-10-CM

## 2024-11-18 DIAGNOSIS — Z7901 Long term (current) use of anticoagulants: Secondary | ICD-10-CM | POA: Diagnosis not present

## 2024-11-18 DIAGNOSIS — L989 Disorder of the skin and subcutaneous tissue, unspecified: Secondary | ICD-10-CM

## 2024-11-18 LAB — PROTIME-INR
INR: 2.1 — ABNORMAL HIGH (ref 0.9–1.2)
Prothrombin Time: 21.3 s — ABNORMAL HIGH (ref 9.1–12.0)

## 2024-11-18 NOTE — Progress Notes (Signed)
 Epic reviewed anticoagulation clinic nurse Dapp reported already spoke with patient today Patient was into clinic yesterday and notified RN Karene that dermatology had excised lip and nose lesion after positive biopsies.  Healing well denied concerns.  Victoria [name],  Anticoagulation clinic staff wants you to continue warfarin   plan: 5 mg (5 mg x 1) every Mon, Thu; 7.5 mg (5 mg x 1.5) all other days  Next recheck with RN Karene 12/23   Your INR was 2.1  Sincerely,  Victoria Hope NP-C

## 2024-12-01 ENCOUNTER — Encounter (HOSPITAL_COMMUNITY): Payer: Self-pay | Admitting: General Surgery

## 2024-12-07 ENCOUNTER — Other Ambulatory Visit: Payer: Self-pay | Admitting: Registered Nurse

## 2024-12-07 ENCOUNTER — Encounter: Payer: Self-pay | Admitting: Registered Nurse

## 2024-12-07 DIAGNOSIS — Z0000 Encounter for general adult medical examination without abnormal findings: Secondary | ICD-10-CM

## 2024-12-14 ENCOUNTER — Other Ambulatory Visit: Payer: Self-pay | Admitting: General Practice

## 2024-12-15 ENCOUNTER — Other Ambulatory Visit: Payer: Self-pay | Admitting: Registered Nurse

## 2024-12-15 ENCOUNTER — Other Ambulatory Visit: Admitting: General Practice

## 2024-12-15 DIAGNOSIS — R7303 Prediabetes: Secondary | ICD-10-CM

## 2024-12-15 LAB — PROTIME-INR

## 2024-12-15 NOTE — Telephone Encounter (Signed)
 Completed.

## 2024-12-16 ENCOUNTER — Other Ambulatory Visit: Payer: Self-pay | Admitting: Registered Nurse

## 2024-12-16 ENCOUNTER — Telehealth: Payer: Self-pay | Admitting: *Deleted

## 2024-12-16 ENCOUNTER — Ambulatory Visit: Payer: Self-pay | Admitting: Registered Nurse

## 2024-12-16 ENCOUNTER — Other Ambulatory Visit: Admitting: General Practice

## 2024-12-16 DIAGNOSIS — R7303 Prediabetes: Secondary | ICD-10-CM

## 2024-12-16 LAB — PROTIME-INR

## 2024-12-16 NOTE — Progress Notes (Signed)
 Clinic received a message from [facility] stating PT INR test could not be run yesterday. Called [facility] customer service, the note left was refrigerated, unable to run. Ice pack left in [facility] box for pick-up due to warmer weather yesterday. Lab redrawn, RN will hand the specimen to the [facility] carrier this afternoon.

## 2024-12-16 NOTE — Telephone Encounter (Signed)
 Patient INR lab was drawn yesterday at [facility]. Noted the lab states canceled and the comment states: Test not performed. Specimen not received at room temperature.   Sent a staff message to Ellouise, NP with [facility] regarding this and the next available day it could be drawn since it is the [place]. Also, called patient to update her that the INR was not resulted and to follow up with the Employee Health to have it redrawn.   Will have to await a response from [facility] to know when the labs can be redrawn and follow up.

## 2024-12-17 LAB — PROTIME-INR
INR: 2 — ABNORMAL HIGH (ref 0.9–1.2)
Prothrombin Time: 20.8 s — ABNORMAL HIGH (ref 9.1–12.0)

## 2024-12-18 ENCOUNTER — Other Ambulatory Visit: Payer: Self-pay

## 2024-12-18 ENCOUNTER — Ambulatory Visit (INDEPENDENT_AMBULATORY_CARE_PROVIDER_SITE_OTHER): Admitting: Cardiovascular Disease

## 2024-12-18 DIAGNOSIS — Z5181 Encounter for therapeutic drug level monitoring: Secondary | ICD-10-CM | POA: Diagnosis not present

## 2024-12-18 DIAGNOSIS — Z952 Presence of prosthetic heart valve: Secondary | ICD-10-CM

## 2024-12-18 DIAGNOSIS — Z7901 Long term (current) use of anticoagulants: Secondary | ICD-10-CM

## 2024-12-18 NOTE — Progress Notes (Signed)
"   INR 2.0 Please see anticoagulation encounter Spoke with pt and instructed her to take 2 tablets today only then continue taking Warfarin 1.5 tablets daily except for 1 tablet on Monday and Thursdays.  Stay consistent with greens each week.  Recheck INR in 4 weeks at [facility] [phone] "

## 2024-12-18 NOTE — Progress Notes (Signed)
 PT INR stable from last month await further instructions from anticoagulation clinic regarding next lab draw timing. Results were forwarded to staff today

## 2024-12-21 LAB — PROTIME-INR

## 2024-12-21 LAB — HGB A1C W/O EAG: Hgb A1c MFr Bld: 6 % — ABNORMAL HIGH (ref 4.8–5.6)

## 2025-01-09 ENCOUNTER — Other Ambulatory Visit: Payer: Self-pay | Admitting: Cardiology

## 2025-01-09 DIAGNOSIS — Z7901 Long term (current) use of anticoagulants: Secondary | ICD-10-CM

## 2025-01-11 NOTE — Telephone Encounter (Signed)
 Refill request for warfarin:  Last INR was 2.0 on 12/18/24 Next INR due 01/12/25 LOV was 03/13/24  Refill approved.

## 2025-01-12 ENCOUNTER — Ambulatory Visit: Admitting: General Practice

## 2025-01-12 ENCOUNTER — Telehealth: Payer: Self-pay | Admitting: Registered Nurse

## 2025-01-12 ENCOUNTER — Encounter: Payer: Self-pay | Admitting: Registered Nurse

## 2025-01-12 ENCOUNTER — Telehealth: Payer: Self-pay | Admitting: *Deleted

## 2025-01-12 DIAGNOSIS — Z952 Presence of prosthetic heart valve: Secondary | ICD-10-CM

## 2025-01-12 DIAGNOSIS — S60511A Abrasion of right hand, initial encounter: Secondary | ICD-10-CM

## 2025-01-12 DIAGNOSIS — Z5181 Encounter for therapeutic drug level monitoring: Secondary | ICD-10-CM

## 2025-01-12 MED ORDER — TRIPLE ANTIBIOTIC 3.5-400-5000 EX OINT: 1.0000 | TOPICAL_OINTMENT | Freq: Every day | CUTANEOUS | Status: AC

## 2025-01-12 NOTE — Telephone Encounter (Signed)
 Patient reported was lifting wood and it slipped and cut top of her hand at home yesterday.  Washed and applied bandage here to ensure not infected.  Wound beefy pink without discharge nummular lesions with epidermal layer absent 1cm diameter x 2 posterior hand.  Patient had clean dry bandage overlying affected area.  Discussed wash with soap and water daily.  Keep covered at work.  Patient was instructed to rest extremities. Do not soak hand in dirty water until abrasions healed avoid pool, lake, hot tub, and dirty sink water. May shower apply neosporin  keep wounds covered they will heal faster and prevent contamination rubbing from clothing tearing off scabs.  Change dressing at least daily after washing with soap and water.  Change prn soiling. Exitcare handout on abrasion given to patient. Medications as directed. Call or return to clinic as needed if these symptoms worsen or fail to improve as anticipated. Patient verbalized agreement and understanding of treatment plan. P2: ROM, injury prevention

## 2025-01-12 NOTE — Progress Notes (Signed)
 PT/INR lab work drawn, sending to [facility] this afternoon. Will send results to Angeline Laura, NP and Dr. Anner.

## 2025-01-12 NOTE — Telephone Encounter (Signed)
 Called patient since INR is due; there was no answer so left a message to call back regarding this.

## 2025-01-13 ENCOUNTER — Ambulatory Visit (INDEPENDENT_AMBULATORY_CARE_PROVIDER_SITE_OTHER): Admitting: *Deleted

## 2025-01-13 DIAGNOSIS — Z952 Presence of prosthetic heart valve: Secondary | ICD-10-CM

## 2025-01-13 DIAGNOSIS — Z7901 Long term (current) use of anticoagulants: Secondary | ICD-10-CM

## 2025-01-13 DIAGNOSIS — Z5181 Encounter for therapeutic drug level monitoring: Secondary | ICD-10-CM | POA: Diagnosis not present

## 2025-01-13 LAB — PROTIME-INR
INR: 3.1 — AB (ref 0.9–1.2)
Prothrombin Time: 31.3 s — ABNORMAL HIGH (ref 9.1–12.0)

## 2025-01-13 NOTE — Patient Instructions (Signed)
 Description   INR-3.1; Spoke with pt and instructed her to take 1 tablet of warfarin today then continue taking Warfarin 1.5 tablets daily except for 1 tablet on Monday and Thursdays.  Stay consistent with greens each week.  Recheck INR in 4 weeks at [facility] [phone]

## 2025-01-13 NOTE — Progress Notes (Signed)
 Description   INR-3.1; Spoke with pt and instructed her to take 1 tablet of warfarin today then continue taking Warfarin 1.5 tablets daily except for 1 tablet on Monday and Thursdays.  Stay consistent with greens each week.  Recheck INR in 4 weeks at [facility] [phone]

## 2025-01-19 NOTE — Telephone Encounter (Signed)
 Patient reported has dermatology appt Friday for lesion removal foot.  Hand still healing and keeping covered with bandage.  Lesion excision chest healing well denied concerns.  Discussed trying different footwear and moleskin to cut out shape to keep pressure off foot incision after lesion removal when she has to wear full shoes at work after appt.  Dressing hand clean dry and intact today and incision wound edges well approximated chest clean and dry and pink Patient verbalized understanding information/instructions and had no further questions at this time.

## 2025-02-15 ENCOUNTER — Other Ambulatory Visit: Admitting: General Practice

## 2025-04-01 ENCOUNTER — Ambulatory Visit: Admitting: Nurse Practitioner
